# Patient Record
Sex: Male | Born: 1950 | Race: Black or African American | Hispanic: No | Marital: Married | Smoking: Never smoker
Health system: Southern US, Community
[De-identification: ages and names within clinical notes are randomized; demographics above are authoritative.]

## PROBLEM LIST (undated history)

## (undated) DIAGNOSIS — I10 Essential (primary) hypertension: Secondary | ICD-10-CM

## (undated) DIAGNOSIS — N189 Chronic kidney disease, unspecified: Secondary | ICD-10-CM

## (undated) DIAGNOSIS — Z86018 Personal history of other benign neoplasm: Secondary | ICD-10-CM

## (undated) DIAGNOSIS — E039 Hypothyroidism, unspecified: Secondary | ICD-10-CM

## (undated) DIAGNOSIS — G4733 Obstructive sleep apnea (adult) (pediatric): Secondary | ICD-10-CM

## (undated) DIAGNOSIS — Z86011 Personal history of benign neoplasm of the brain: Secondary | ICD-10-CM

## (undated) DIAGNOSIS — E230 Hypopituitarism: Secondary | ICD-10-CM

## (undated) DIAGNOSIS — E119 Type 2 diabetes mellitus without complications: Secondary | ICD-10-CM

## (undated) DIAGNOSIS — Z8639 Personal history of other endocrine, nutritional and metabolic disease: Secondary | ICD-10-CM

## (undated) DIAGNOSIS — G40909 Epilepsy, unspecified, not intractable, without status epilepticus: Principal | ICD-10-CM

## (undated) HISTORY — DX: Personal history of benign neoplasm of the brain: Z86.011

## (undated) HISTORY — DX: Hypopituitarism: E23.0

## (undated) HISTORY — DX: Type 2 diabetes mellitus without complications: E11.9

## (undated) HISTORY — PX: BRAIN SURGERY: SHX531

## (undated) HISTORY — DX: Epilepsy, unspecified, not intractable, without status epilepticus: G40.909

---

## 1998-07-24 ENCOUNTER — Ambulatory Visit (HOSPITAL_COMMUNITY): Admission: RE | Admit: 1998-07-24 | Discharge: 1998-07-24 | Payer: Self-pay | Admitting: Gastroenterology

## 1999-10-29 ENCOUNTER — Encounter: Payer: Self-pay | Admitting: Cardiovascular Disease

## 1999-10-29 ENCOUNTER — Ambulatory Visit: Admission: RE | Admit: 1999-10-29 | Discharge: 1999-10-29 | Payer: Self-pay | Admitting: Cardiovascular Disease

## 2001-07-21 ENCOUNTER — Ambulatory Visit (HOSPITAL_COMMUNITY): Admission: RE | Admit: 2001-07-21 | Discharge: 2001-07-21 | Payer: Self-pay | Admitting: Gastroenterology

## 2013-08-31 ENCOUNTER — Encounter: Payer: Self-pay | Admitting: *Deleted

## 2013-09-03 ENCOUNTER — Encounter: Payer: Self-pay | Admitting: Neurology

## 2013-09-03 ENCOUNTER — Ambulatory Visit (INDEPENDENT_AMBULATORY_CARE_PROVIDER_SITE_OTHER): Payer: Medicare Other | Admitting: Neurology

## 2013-09-03 VITALS — BP 192/119 | HR 67 | Ht 71.0 in | Wt 260.0 lb

## 2013-09-03 DIAGNOSIS — I639 Cerebral infarction, unspecified: Secondary | ICD-10-CM

## 2013-09-03 DIAGNOSIS — I6350 Cerebral infarction due to unspecified occlusion or stenosis of unspecified cerebral artery: Secondary | ICD-10-CM

## 2013-09-03 DIAGNOSIS — I10 Essential (primary) hypertension: Secondary | ICD-10-CM

## 2013-09-03 NOTE — Patient Instructions (Signed)
Overall you are doing fairly well but I do want to suggest a few things today:   Remember to drink plenty of fluid, eat healthy meals and do not skip any meals. Try to eat protein with a every meal and eat a healthy snack such as fruit or nuts in between meals. Try to keep a regular sleep-wake schedule and try to exercise daily, particularly in the form of walking, 20-30 minutes a day, if you can.   As far as your medications are concerned, I would like to suggest you start taking a daily baby aspirin (81mg ).   Continue taking your cholesterol medication daily  Please follow up with your primary care physician today for blood pressure optimization.   Please have some blood work drawn today after checking out  I would like you to have a carotid ultrasound and brain MRI done  We will follow up once the workup is completed. Please call us with any interim questions, concerns, problems, updates or refill requests.   My clinical assistant and will answer any of your questions and relay your messages to me and also relay most of my messages to you.   Our phone number is [phone]. We also have an after hours call service for urgent matters and there is a physician on-call for urgent questions. For any emergencies you know to call 911 or go to the nearest emergency room

## 2013-09-03 NOTE — Progress Notes (Signed)
[facility]    Provider:  Dr Janann Colonel Referring Provider: Keene Breath., MD Primary Care Physician:  Placido Sou, MD  CC:  Visual field   HPI:  Trevor Kelley is a 63 y.o. male here as a referral from Dr. Clydene Laming for visual field defect. Was noted by eye doctor, patient did not notice any deficits. On routine eye exam was noted to have visual deficit in the superior temporal quadrant. Patient does not recall any episodes of transient visual changes. No recent episodes of feeling off balance, bumping into things, car accidents. No episodes of facial weakness, ptosis. No history of focal weakness or sensory changes.   Overall feels he is pretty healthy. Has elevated blood pressure, currently taking 3 medications for the blood pressure. No history of DM. Does not take a daily ASA. Takes a daily statin.   Review of Systems: Out of a complete 14 system review, the patient complains of only the following symptoms, and all other reviewed systems are negative. + loss of vision, eye pain, swelling in legs, weakness, snoring, not enough sleep, decreased energy  History   Social History  . Marital Status: Married    Spouse Name: N/A    Number of Children: N/A  . Years of Education: N/A   Occupational History  . Not on file.   Social History Main Topics  . Smoking status: Never Smoker   . Smokeless tobacco: Never Used  . Alcohol Use: No  . Drug Use: No  . Sexual Activity: Not on file   Other Topics Concern  . Not on file   Social History Narrative   Married, 2 children   Right handed   12 th grade   1 cup daily    Family History  Problem Relation Age of Onset  . Hypothyroidism    . Diabetes Mellitus II    . Cancer    . Hypertension      No past medical history on file.  No past surgical history on file.  Current Outpatient Prescriptions  Medication Sig Dispense Refill  . bimatoprost (LUMIGAN) 0.01 % SOLN 1 drop at bedtime.      . carvedilol  (COREG) 12.5 MG tablet Take 12.5 mg by mouth 2 (two) times daily with a meal.      . furosemide (LASIX) 20 MG tablet Take 20 mg by mouth.      Marland Kitchen amLODipine (NORVASC) 10 MG tablet Take 10 mg by mouth daily.       No current facility-administered medications for this visit.    Allergies as of 09/03/2013  . (No Known Allergies)    Vitals: BP 192/119  Pulse 67  Ht 5\' 11"  (1.803 m)  Wt 174 lb (78.926 kg)  BMI 24.28 kg/m2 Last Weight:  Wt Readings from Last 1 Encounters:  09/03/13 174 lb (78.926 kg)   Last Height:   Ht Readings from Last 1 Encounters:  09/03/13 5\' 11"  (1.803 m)     Physical exam: Exam: Gen: NAD, conversant Eyes: anicteric sclerae, moist conjunctivae HENT: Atraumatic, oropharynx clear Neck: Trachea midline; supple,  Lungs: CTA, no wheezing, rales, rhonic                          CV: RRR, no MRG, no carotid bruits Abdomen: Soft, non-tender;  Extremities: No peripheral edema  Skin: Normal temperature, no rash,  Psych: Appropriate affect, pleasant  Neuro: MS: AA&Ox3, appropriately interactive, normal affect   Speech:  fluent w/o paraphasic error  Memory: good recent and remote recall  CN: PERRL, EOMI no nystagmus,No VF deficits noted, no ptosis, sensation intact to LT V1-V3 bilat, face symmetric, no weakness, hearing grossly intact, palate elevates symmetrically, shoulder shrug 5/5 bilat,  tongue protrudes midline, no fasiculations noted.  Motor: normal bulk and tone Strength: 5/5  In all extremities  Coord: rapid alternating and point-to-point (FNF, HTS) movements intact.  Reflexes: symmetrical, bilat downgoing toes  Sens: LT intact in all extremities  Gait: posture, stance, stride and arm-swing normal. Tandem gait intact. Able to walk on heels and toes. Romberg absent.   Assessment:  After physical and neurologic examination, review of laboratory studies, imaging, neurophysiology testing and pre-existing records, assessment will be reviewed on  the problem list.  Plan:  Treatment plan and additional workup will be reviewed under Problem List.  1)Visual field deficit 2)HTN  63y/o gentleman presenting for initial evaluation of noted OD VF deficit found on routine eye exam, patient denies any subjective visual loss. With age and risk factors would have concern over possible CVA. Will check MRI brain, carotid ultrasound, HbA1c and lipid panel. Will start daily 81mg  ASA, continue on Statin. Patient instructed to follow up with PCP today due to continued elevated BP, will likely need BP medication adjustment. Follow up once workup completed.   Jim Like, DO  [facility] [address] [place], [place] [address]  Phone [phone] Fax [phone]

## 2013-09-04 LAB — LIPID PANEL WITH LDL/HDL RATIO
Cholesterol, Total: 122 mg/dL (ref 100–199)
HDL: 30 mg/dL — ABNORMAL LOW (ref >39)
LDL Calculated: 71 mg/dL (ref 0–99)
LDl/HDL Ratio: 2.4 ratio units (ref 0.0–3.6)
Triglycerides: 103 mg/dL (ref 0–149)
VLDL Cholesterol Cal: 21 mg/dL (ref 5–40)

## 2013-09-04 LAB — HGB A1C W/O EAG: Hgb A1c MFr Bld: 6.6 % — ABNORMAL HIGH (ref 4.8–5.6)

## 2013-09-04 NOTE — Progress Notes (Signed)
Quick Note:  Tried calling patient, unable to leave a voice mail, get a fax sound, will try again later. ______

## 2013-09-04 NOTE — Progress Notes (Signed)
Quick Note:  Informed patient of elevated blood sugar, instructed patient to follow up with his PCP to monitor this. ______

## 2013-09-11 ENCOUNTER — Encounter: Payer: Self-pay | Admitting: Neurology

## 2013-09-11 ENCOUNTER — Ambulatory Visit (INDEPENDENT_AMBULATORY_CARE_PROVIDER_SITE_OTHER): Payer: Medicare Other | Admitting: Neurology

## 2013-09-11 ENCOUNTER — Other Ambulatory Visit: Payer: Self-pay | Admitting: Neurology

## 2013-09-11 ENCOUNTER — Ambulatory Visit
Admission: RE | Admit: 2013-09-11 | Discharge: 2013-09-11 | Disposition: A | Discharge status: Home / Self Care | Payer: Medicare Other | Source: Ambulatory Visit | Attending: Neurology | Admitting: Neurology

## 2013-09-11 VITALS — BP 149/90 | HR 86 | Ht 72.0 in | Wt 252.0 lb

## 2013-09-11 DIAGNOSIS — I639 Cerebral infarction, unspecified: Secondary | ICD-10-CM

## 2013-09-11 DIAGNOSIS — G9389 Other specified disorders of brain: Secondary | ICD-10-CM

## 2013-09-11 DIAGNOSIS — I6350 Cerebral infarction due to unspecified occlusion or stenosis of unspecified cerebral artery: Secondary | ICD-10-CM

## 2013-09-11 DIAGNOSIS — G939 Disorder of brain, unspecified: Secondary | ICD-10-CM

## 2013-09-11 MED ORDER — GADOBENATE DIMEGLUMINE 529 MG/ML IV SOLN
20.0000 mL | Freq: Once | INTRAVENOUS | Status: AC | PRN
  Administered 2013-09-11: 20 mL via INTRAVENOUS

## 2013-09-11 NOTE — Progress Notes (Signed)
[facility]    Provider:  Dr Janann Colonel Referring Provider: Deterding, Joyice Faster, MD Primary Care Physician:  Placido Sou, MD  CC:  Visual field   HPI:  Trevor Kelley is a 63 y.o. male here as a follow up from Dr. Jimmy Footman for visual field defect. Returns today to discuss MRI results with concern over a mass noted. He reports being stable overall since last visit. No acute concerns.   MRI reviewed with findings concerning for a meningioma. Official report pending.    Initial visit 08/2013 Was noted by eye doctor, patient did not notice any deficits. On routine eye exam was noted to have visual deficit in the superior temporal quadrant. Patient does not recall any episodes of transient visual changes. No recent episodes of feeling off balance, bumping into things, car accidents. No episodes of facial weakness, ptosis. No history of focal weakness or sensory changes.   Overall feels he is pretty healthy. Has elevated blood pressure, currently taking 3 medications for the blood pressure. No history of DM. Does not take a daily ASA. Takes a daily statin.   Review of Systems: Out of a complete 14 system review, the patient complains of only the following symptoms, and all other reviewed systems are negative. + loss of vision, eye pain  History   Social History  . Marital Status: Married    Spouse Name: N/A    Number of Children: N/A  . Years of Education: N/A   Occupational History  . Not on file.   Social History Main Topics  . Smoking status: Never Smoker   . Smokeless tobacco: Never Used  . Alcohol Use: No  . Drug Use: No  . Sexual Activity: Not on file   Other Topics Concern  . Not on file   Social History Narrative   Married, 2 children   Right handed   12 th grade   1 cup daily    Family History  Problem Relation Age of Onset  . Hypothyroidism    . Diabetes Mellitus II    . Cancer    . Hypertension      No past medical history on  file.  No past surgical history on file.  Current Outpatient Prescriptions  Medication Sig Dispense Refill  . amLODipine (NORVASC) 10 MG tablet Take 10 mg by mouth daily.      . bimatoprost (LUMIGAN) 0.01 % SOLN 1 drop at bedtime.      . carvedilol (COREG) 12.5 MG tablet Take 12.5 mg by mouth 2 (two) times daily with a meal.      . finasteride (PROSCAR) 5 MG tablet Take 5 mg by mouth daily.      . furosemide (LASIX) 20 MG tablet Take 20 mg by mouth.      Marland Kitchen lisinopril (PRINIVIL,ZESTRIL) 40 MG tablet Take 40 mg by mouth daily.      . simvastatin (ZOCOR) 20 MG tablet Take 20 mg by mouth daily.      . tamsulosin (FLOMAX) 0.4 MG CAPS capsule Take 0.4 mg by mouth.       No current facility-administered medications for this visit.    Allergies as of 09/11/2013  . (No Known Allergies)    Vitals: BP 149/90  Pulse 86  Ht 6' (1.829 m)  Wt 252 lb (114.306 kg)  BMI 34.17 kg/m2 Last Weight:  Wt Readings from Last 1 Encounters:  09/11/13 252 lb (114.306 kg)   Last Height:   Ht Readings from Last  1 Encounters:  09/11/13 6' (1.829 m)     Physical exam: Exam: Gen: NAD, conversant Eyes: anicteric sclerae, moist conjunctivae HENT: Atraumatic, oropharynx clear Neck: Trachea midline; supple,  Lungs: CTA, no wheezing, rales, rhonic                          CV: RRR, no MRG, no carotid bruits Abdomen: Soft, non-tender;  Extremities: No peripheral edema  Skin: Normal temperature, no rash,  Psych: Appropriate affect, pleasant  Neuro: MS: AA&Ox3, appropriately interactive, normal affect   Speech: fluent w/o paraphasic error  Memory: good recent and remote recall  CN: PERRL, EOMI no nystagmus,No VF deficits noted, no ptosis, sensation intact to LT V1-V3 bilat, face symmetric, no weakness, hearing grossly intact, palate elevates symmetrically, shoulder shrug 5/5 bilat,  tongue protrudes midline, no fasiculations noted.  Motor: normal bulk and tone Strength: 5/5  In all  extremities  Coord: rapid alternating and point-to-point (FNF, HTS) movements intact.  Reflexes: symmetrical, bilat downgoing toes  Sens: LT intact in all extremities  Gait: posture, stance, stride and arm-swing normal. Tandem gait intact. Able to walk on heels and toes. Romberg absent.   Assessment:  After physical and neurologic examination, review of laboratory studies, imaging, neurophysiology testing and pre-existing records, assessment will be reviewed on the problem list.  Plan:  Treatment plan and additional workup will be reviewed under Problem List.  1)Visual field deficit 2)Brain mass  63y/o gentleman presenting for follow up evaluation of noted OD VF deficit found on routine eye exam, patient denies any subjective visual loss. MRI recently completed shows a brain mass which appears consistent with a meningioma (official report pending). Will refer to neurosurgery for further workup and treatment. Follow up as needed.   Jim Like, DO  [facility] [address] [place], [place] [address]  Phone [phone] Fax [phone]

## 2013-09-14 ENCOUNTER — Other Ambulatory Visit: Payer: Medicare Other

## 2013-09-18 ENCOUNTER — Telehealth: Payer: Self-pay | Admitting: Neurology

## 2013-09-18 ENCOUNTER — Other Ambulatory Visit: Payer: Self-pay | Admitting: Neurology

## 2013-09-18 DIAGNOSIS — G9389 Other specified disorders of brain: Secondary | ICD-10-CM

## 2013-09-18 NOTE — Telephone Encounter (Signed)
Patient calling requesting an second opinion to another Neuro Surgeon.  Saw Dr Diamantina Monks on 5/5, but discussed with family and would like an second opinion.  Thanks

## 2013-09-18 NOTE — Telephone Encounter (Signed)
Please let him know a referral was placed to [facility]. He will receive a call to schedule an appointment. Thanks.

## 2013-09-19 ENCOUNTER — Telehealth: Payer: Self-pay | Admitting: Neurology

## 2013-09-19 ENCOUNTER — Other Ambulatory Visit (HOSPITAL_COMMUNITY): Payer: Self-pay | Admitting: Neurosurgery

## 2013-09-19 DIAGNOSIS — D329 Benign neoplasm of meninges, unspecified: Secondary | ICD-10-CM

## 2013-09-19 NOTE — Telephone Encounter (Signed)
Pt information was faxed over to [facility] [place] with [place] April. Confirmation received.

## 2013-09-19 NOTE — Telephone Encounter (Signed)
Informed patient's wife , she verbalized understanding

## 2013-09-19 NOTE — Telephone Encounter (Signed)
April from [facility] called wants Dr. Hazle Quant nurse to fax her the Imaging Report and Notes from pt's visit. Fax to [phone]. If you have any questions please call April at [phone]. Thanks

## 2013-09-20 ENCOUNTER — Encounter: Payer: Self-pay | Admitting: *Deleted

## 2013-09-28 ENCOUNTER — Inpatient Hospital Stay: Admit: 2013-09-28 | Payer: Self-pay | Admitting: Neurosurgery

## 2013-09-28 SURGERY — CRANIOTOMY TUMOR EXCISION
Anesthesia: General | Laterality: Bilateral

## 2013-10-02 ENCOUNTER — Telehealth: Payer: Self-pay | Admitting: Neurology

## 2013-10-02 NOTE — Telephone Encounter (Signed)
Pt calling wanting to make Dr. Janann Colonel aware that he is having surgery at [facility] with Dr. Harrison Mons. FYI

## 2013-10-02 NOTE — Telephone Encounter (Signed)
FYI..Patient wanted to inform Dr. Janann Colonel that he has decided to have surgery at [facility] with Dr Harrison Mons.

## 2014-02-05 DIAGNOSIS — E291 Testicular hypofunction: Secondary | ICD-10-CM | POA: Diagnosis present

## 2014-02-05 DIAGNOSIS — E669 Obesity, unspecified: Secondary | ICD-10-CM | POA: Diagnosis present

## 2014-02-05 DIAGNOSIS — E1169 Type 2 diabetes mellitus with other specified complication: Secondary | ICD-10-CM | POA: Diagnosis present

## 2014-02-05 DIAGNOSIS — E119 Type 2 diabetes mellitus without complications: Secondary | ICD-10-CM | POA: Diagnosis present

## 2014-02-05 DIAGNOSIS — E038 Other specified hypothyroidism: Secondary | ICD-10-CM | POA: Diagnosis present

## 2014-02-05 DIAGNOSIS — E2749 Other adrenocortical insufficiency: Secondary | ICD-10-CM | POA: Diagnosis present

## 2014-02-12 DIAGNOSIS — D352 Benign neoplasm of pituitary gland: Secondary | ICD-10-CM | POA: Diagnosis present

## 2014-05-07 DIAGNOSIS — N182 Chronic kidney disease, stage 2 (mild): Secondary | ICD-10-CM | POA: Diagnosis present

## 2014-05-07 DIAGNOSIS — N189 Chronic kidney disease, unspecified: Secondary | ICD-10-CM | POA: Diagnosis present

## 2014-08-21 ENCOUNTER — Encounter: Payer: Self-pay | Admitting: Urology

## 2014-12-03 DIAGNOSIS — G4733 Obstructive sleep apnea (adult) (pediatric): Secondary | ICD-10-CM | POA: Diagnosis present

## 2016-05-25 DIAGNOSIS — Z79899 Other long term (current) drug therapy: Secondary | ICD-10-CM | POA: Diagnosis not present

## 2016-05-25 DIAGNOSIS — E038 Other specified hypothyroidism: Secondary | ICD-10-CM | POA: Diagnosis not present

## 2016-05-25 DIAGNOSIS — R7303 Prediabetes: Secondary | ICD-10-CM | POA: Diagnosis not present

## 2016-05-25 DIAGNOSIS — E230 Hypopituitarism: Secondary | ICD-10-CM | POA: Diagnosis not present

## 2016-05-25 DIAGNOSIS — E1122 Type 2 diabetes mellitus with diabetic chronic kidney disease: Secondary | ICD-10-CM | POA: Diagnosis not present

## 2016-05-25 DIAGNOSIS — Z9989 Dependence on other enabling machines and devices: Secondary | ICD-10-CM | POA: Diagnosis not present

## 2016-05-25 DIAGNOSIS — E785 Hyperlipidemia, unspecified: Secondary | ICD-10-CM | POA: Diagnosis not present

## 2016-05-25 DIAGNOSIS — N183 Chronic kidney disease, stage 3 (moderate): Secondary | ICD-10-CM | POA: Diagnosis not present

## 2016-05-25 DIAGNOSIS — I10 Essential (primary) hypertension: Secondary | ICD-10-CM | POA: Diagnosis not present

## 2016-05-25 DIAGNOSIS — E1169 Type 2 diabetes mellitus with other specified complication: Secondary | ICD-10-CM | POA: Diagnosis not present

## 2016-05-25 DIAGNOSIS — E221 Hyperprolactinemia: Secondary | ICD-10-CM | POA: Diagnosis not present

## 2016-05-25 DIAGNOSIS — N400 Enlarged prostate without lower urinary tract symptoms: Secondary | ICD-10-CM | POA: Diagnosis not present

## 2016-05-25 DIAGNOSIS — E039 Hypothyroidism, unspecified: Secondary | ICD-10-CM | POA: Diagnosis not present

## 2016-05-25 DIAGNOSIS — G4733 Obstructive sleep apnea (adult) (pediatric): Secondary | ICD-10-CM | POA: Diagnosis not present

## 2016-05-25 DIAGNOSIS — E2740 Unspecified adrenocortical insufficiency: Secondary | ICD-10-CM | POA: Diagnosis not present

## 2016-05-25 DIAGNOSIS — E2749 Other adrenocortical insufficiency: Secondary | ICD-10-CM | POA: Diagnosis not present

## 2016-05-25 DIAGNOSIS — D352 Benign neoplasm of pituitary gland: Secondary | ICD-10-CM | POA: Diagnosis not present

## 2016-05-25 DIAGNOSIS — E291 Testicular hypofunction: Secondary | ICD-10-CM | POA: Diagnosis not present

## 2016-05-25 DIAGNOSIS — Z9889 Other specified postprocedural states: Secondary | ICD-10-CM | POA: Diagnosis not present

## 2016-05-25 DIAGNOSIS — I129 Hypertensive chronic kidney disease with stage 1 through stage 4 chronic kidney disease, or unspecified chronic kidney disease: Secondary | ICD-10-CM | POA: Diagnosis not present

## 2016-05-25 DIAGNOSIS — N2581 Secondary hyperparathyroidism of renal origin: Secondary | ICD-10-CM | POA: Diagnosis not present

## 2016-06-28 DIAGNOSIS — H53413 Scotoma involving central area, bilateral: Secondary | ICD-10-CM | POA: Diagnosis not present

## 2016-06-28 DIAGNOSIS — E119 Type 2 diabetes mellitus without complications: Secondary | ICD-10-CM | POA: Diagnosis not present

## 2016-06-28 DIAGNOSIS — H401132 Primary open-angle glaucoma, bilateral, moderate stage: Secondary | ICD-10-CM | POA: Diagnosis not present

## 2016-07-12 DIAGNOSIS — N2581 Secondary hyperparathyroidism of renal origin: Secondary | ICD-10-CM | POA: Diagnosis not present

## 2016-07-12 DIAGNOSIS — E663 Overweight: Secondary | ICD-10-CM | POA: Diagnosis not present

## 2016-07-12 DIAGNOSIS — D631 Anemia in chronic kidney disease: Secondary | ICD-10-CM | POA: Diagnosis not present

## 2016-07-12 DIAGNOSIS — R809 Proteinuria, unspecified: Secondary | ICD-10-CM | POA: Diagnosis not present

## 2016-07-12 DIAGNOSIS — I129 Hypertensive chronic kidney disease with stage 1 through stage 4 chronic kidney disease, or unspecified chronic kidney disease: Secondary | ICD-10-CM | POA: Diagnosis not present

## 2016-07-12 DIAGNOSIS — N529 Male erectile dysfunction, unspecified: Secondary | ICD-10-CM | POA: Diagnosis not present

## 2016-07-12 DIAGNOSIS — D352 Benign neoplasm of pituitary gland: Secondary | ICD-10-CM | POA: Diagnosis not present

## 2016-07-12 DIAGNOSIS — E782 Mixed hyperlipidemia: Secondary | ICD-10-CM | POA: Diagnosis not present

## 2016-07-12 DIAGNOSIS — R319 Hematuria, unspecified: Secondary | ICD-10-CM | POA: Diagnosis not present

## 2016-07-12 DIAGNOSIS — N183 Chronic kidney disease, stage 3 (moderate): Secondary | ICD-10-CM | POA: Diagnosis not present

## 2016-10-26 DIAGNOSIS — H401132 Primary open-angle glaucoma, bilateral, moderate stage: Secondary | ICD-10-CM | POA: Diagnosis not present

## 2016-10-26 DIAGNOSIS — H2513 Age-related nuclear cataract, bilateral: Secondary | ICD-10-CM | POA: Diagnosis not present

## 2016-11-23 DIAGNOSIS — N183 Chronic kidney disease, stage 3 (moderate): Secondary | ICD-10-CM | POA: Diagnosis not present

## 2016-11-23 DIAGNOSIS — E2749 Other adrenocortical insufficiency: Secondary | ICD-10-CM | POA: Diagnosis not present

## 2016-11-23 DIAGNOSIS — I129 Hypertensive chronic kidney disease with stage 1 through stage 4 chronic kidney disease, or unspecified chronic kidney disease: Secondary | ICD-10-CM | POA: Diagnosis not present

## 2016-11-23 DIAGNOSIS — N2581 Secondary hyperparathyroidism of renal origin: Secondary | ICD-10-CM | POA: Diagnosis not present

## 2016-11-23 DIAGNOSIS — E1122 Type 2 diabetes mellitus with diabetic chronic kidney disease: Secondary | ICD-10-CM | POA: Diagnosis not present

## 2016-11-23 DIAGNOSIS — D352 Benign neoplasm of pituitary gland: Secondary | ICD-10-CM | POA: Diagnosis not present

## 2016-11-23 DIAGNOSIS — E230 Hypopituitarism: Secondary | ICD-10-CM | POA: Diagnosis not present

## 2016-11-23 DIAGNOSIS — E039 Hypothyroidism, unspecified: Secondary | ICD-10-CM | POA: Diagnosis not present

## 2016-11-23 DIAGNOSIS — R7303 Prediabetes: Secondary | ICD-10-CM | POA: Diagnosis not present

## 2016-11-23 DIAGNOSIS — Z9889 Other specified postprocedural states: Secondary | ICD-10-CM | POA: Diagnosis not present

## 2016-11-23 DIAGNOSIS — E038 Other specified hypothyroidism: Secondary | ICD-10-CM | POA: Diagnosis not present

## 2016-11-23 DIAGNOSIS — E785 Hyperlipidemia, unspecified: Secondary | ICD-10-CM | POA: Diagnosis not present

## 2016-11-23 DIAGNOSIS — E8809 Other disorders of plasma-protein metabolism, not elsewhere classified: Secondary | ICD-10-CM | POA: Diagnosis not present

## 2016-11-24 DIAGNOSIS — E119 Type 2 diabetes mellitus without complications: Secondary | ICD-10-CM | POA: Diagnosis not present

## 2016-11-24 DIAGNOSIS — H3589 Other specified retinal disorders: Secondary | ICD-10-CM | POA: Diagnosis not present

## 2016-11-24 DIAGNOSIS — H401132 Primary open-angle glaucoma, bilateral, moderate stage: Secondary | ICD-10-CM | POA: Diagnosis not present

## 2016-11-24 DIAGNOSIS — H53439 Sector or arcuate defects, unspecified eye: Secondary | ICD-10-CM | POA: Diagnosis not present

## 2016-12-07 DIAGNOSIS — E291 Testicular hypofunction: Secondary | ICD-10-CM | POA: Diagnosis not present

## 2016-12-07 DIAGNOSIS — N401 Enlarged prostate with lower urinary tract symptoms: Secondary | ICD-10-CM | POA: Diagnosis not present

## 2016-12-13 DIAGNOSIS — R800 Isolated proteinuria: Secondary | ICD-10-CM | POA: Diagnosis not present

## 2016-12-13 DIAGNOSIS — R351 Nocturia: Secondary | ICD-10-CM | POA: Diagnosis not present

## 2016-12-13 DIAGNOSIS — E291 Testicular hypofunction: Secondary | ICD-10-CM | POA: Diagnosis not present

## 2016-12-13 DIAGNOSIS — N5201 Erectile dysfunction due to arterial insufficiency: Secondary | ICD-10-CM | POA: Diagnosis not present

## 2016-12-13 DIAGNOSIS — N401 Enlarged prostate with lower urinary tract symptoms: Secondary | ICD-10-CM | POA: Diagnosis not present

## 2016-12-14 DIAGNOSIS — I129 Hypertensive chronic kidney disease with stage 1 through stage 4 chronic kidney disease, or unspecified chronic kidney disease: Secondary | ICD-10-CM | POA: Diagnosis not present

## 2016-12-14 DIAGNOSIS — N529 Male erectile dysfunction, unspecified: Secondary | ICD-10-CM | POA: Diagnosis not present

## 2016-12-14 DIAGNOSIS — N183 Chronic kidney disease, stage 3 (moderate): Secondary | ICD-10-CM | POA: Diagnosis not present

## 2016-12-14 DIAGNOSIS — D631 Anemia in chronic kidney disease: Secondary | ICD-10-CM | POA: Diagnosis not present

## 2016-12-14 DIAGNOSIS — D649 Anemia, unspecified: Secondary | ICD-10-CM | POA: Diagnosis not present

## 2016-12-14 DIAGNOSIS — D352 Benign neoplasm of pituitary gland: Secondary | ICD-10-CM | POA: Diagnosis not present

## 2016-12-14 DIAGNOSIS — E663 Overweight: Secondary | ICD-10-CM | POA: Diagnosis not present

## 2016-12-14 DIAGNOSIS — R319 Hematuria, unspecified: Secondary | ICD-10-CM | POA: Diagnosis not present

## 2016-12-14 DIAGNOSIS — N2581 Secondary hyperparathyroidism of renal origin: Secondary | ICD-10-CM | POA: Diagnosis not present

## 2016-12-14 DIAGNOSIS — E782 Mixed hyperlipidemia: Secondary | ICD-10-CM | POA: Diagnosis not present

## 2016-12-14 DIAGNOSIS — R809 Proteinuria, unspecified: Secondary | ICD-10-CM | POA: Diagnosis not present

## 2016-12-30 ENCOUNTER — Encounter (HOSPITAL_COMMUNITY): Payer: Self-pay

## 2016-12-30 ENCOUNTER — Ambulatory Visit (HOSPITAL_COMMUNITY)
Admission: EM | Admit: 2016-12-30 | Discharge: 2016-12-30 | Disposition: A | Discharge status: Home / Self Care | Payer: Medicare Other

## 2016-12-30 DIAGNOSIS — E2749 Other adrenocortical insufficiency: Secondary | ICD-10-CM | POA: Diagnosis not present

## 2016-12-30 DIAGNOSIS — E876 Hypokalemia: Secondary | ICD-10-CM | POA: Diagnosis present

## 2016-12-30 DIAGNOSIS — N184 Chronic kidney disease, stage 4 (severe): Secondary | ICD-10-CM | POA: Diagnosis not present

## 2016-12-30 DIAGNOSIS — A419 Sepsis, unspecified organism: Principal | ICD-10-CM | POA: Diagnosis present

## 2016-12-30 DIAGNOSIS — I129 Hypertensive chronic kidney disease with stage 1 through stage 4 chronic kidney disease, or unspecified chronic kidney disease: Secondary | ICD-10-CM | POA: Diagnosis not present

## 2016-12-30 DIAGNOSIS — G4733 Obstructive sleep apnea (adult) (pediatric): Secondary | ICD-10-CM | POA: Diagnosis present

## 2016-12-30 DIAGNOSIS — E1122 Type 2 diabetes mellitus with diabetic chronic kidney disease: Secondary | ICD-10-CM | POA: Diagnosis present

## 2016-12-30 DIAGNOSIS — Z79899 Other long term (current) drug therapy: Secondary | ICD-10-CM

## 2016-12-30 DIAGNOSIS — Z6833 Body mass index (BMI) 33.0-33.9, adult: Secondary | ICD-10-CM

## 2016-12-30 DIAGNOSIS — D696 Thrombocytopenia, unspecified: Secondary | ICD-10-CM | POA: Diagnosis present

## 2016-12-30 DIAGNOSIS — Z23 Encounter for immunization: Secondary | ICD-10-CM

## 2016-12-30 DIAGNOSIS — N179 Acute kidney failure, unspecified: Secondary | ICD-10-CM | POA: Diagnosis present

## 2016-12-30 DIAGNOSIS — M7989 Other specified soft tissue disorders: Secondary | ICD-10-CM | POA: Diagnosis not present

## 2016-12-30 DIAGNOSIS — R197 Diarrhea, unspecified: Secondary | ICD-10-CM | POA: Diagnosis not present

## 2016-12-30 DIAGNOSIS — L03115 Cellulitis of right lower limb: Secondary | ICD-10-CM | POA: Diagnosis present

## 2016-12-30 DIAGNOSIS — N189 Chronic kidney disease, unspecified: Secondary | ICD-10-CM | POA: Diagnosis not present

## 2016-12-30 DIAGNOSIS — I959 Hypotension, unspecified: Secondary | ICD-10-CM | POA: Diagnosis not present

## 2016-12-30 DIAGNOSIS — E291 Testicular hypofunction: Secondary | ICD-10-CM | POA: Diagnosis present

## 2016-12-30 DIAGNOSIS — D631 Anemia in chronic kidney disease: Secondary | ICD-10-CM | POA: Diagnosis present

## 2016-12-30 DIAGNOSIS — E669 Obesity, unspecified: Secondary | ICD-10-CM | POA: Diagnosis present

## 2016-12-30 DIAGNOSIS — D352 Benign neoplasm of pituitary gland: Secondary | ICD-10-CM | POA: Diagnosis present

## 2016-12-30 DIAGNOSIS — E039 Hypothyroidism, unspecified: Secondary | ICD-10-CM | POA: Diagnosis present

## 2016-12-30 DIAGNOSIS — D509 Iron deficiency anemia, unspecified: Secondary | ICD-10-CM | POA: Diagnosis present

## 2016-12-30 LAB — URINALYSIS, ROUTINE W REFLEX MICROSCOPIC
BACTERIA UA: NONE SEEN
Bilirubin Urine: NEGATIVE
Glucose, UA: NEGATIVE mg/dL
KETONES UR: NEGATIVE mg/dL
LEUKOCYTES UA: NEGATIVE
Nitrite: NEGATIVE
PH: 6 (ref 5.0–8.0)
Protein, ur: 100 mg/dL — AB
SQUAMOUS EPITHELIAL / LPF: NONE SEEN
Specific Gravity, Urine: 1.013 (ref 1.005–1.030)

## 2016-12-30 LAB — COMPREHENSIVE METABOLIC PANEL
ALBUMIN: 3.3 g/dL — AB (ref 3.5–5.0)
ALK PHOS: 65 U/L (ref 38–126)
ALT: 47 U/L (ref 17–63)
AST: 53 U/L — AB (ref 15–41)
Anion gap: 10 (ref 5–15)
BUN: 18 mg/dL (ref 6–20)
CALCIUM: 8.3 mg/dL — AB (ref 8.9–10.3)
CO2: 22 mmol/L (ref 22–32)
CREATININE: 2.91 mg/dL — AB (ref 0.61–1.24)
Chloride: 105 mmol/L (ref 101–111)
GFR calc Af Amer: 24 mL/min — ABNORMAL LOW (ref >60)
GFR calc non Af Amer: 21 mL/min — ABNORMAL LOW (ref >60)
GLUCOSE: 103 mg/dL — AB (ref 65–99)
Potassium: 3 mmol/L — ABNORMAL LOW (ref 3.5–5.1)
SODIUM: 137 mmol/L (ref 135–145)
Total Bilirubin: 1.2 mg/dL (ref 0.3–1.2)
Total Protein: 7.1 g/dL (ref 6.5–8.1)

## 2016-12-30 LAB — CBC WITH DIFFERENTIAL/PLATELET
Basophils Absolute: 0 10*3/uL (ref 0.0–0.1)
Basophils Relative: 0 %
EOS ABS: 0.1 10*3/uL (ref 0.0–0.7)
EOS PCT: 1 %
HCT: 38.3 % — ABNORMAL LOW (ref 39.0–52.0)
HEMOGLOBIN: 12.6 g/dL — AB (ref 13.0–17.0)
LYMPHS ABS: 2.8 10*3/uL (ref 0.7–4.0)
Lymphocytes Relative: 19 %
MCH: 24.8 pg — AB (ref 26.0–34.0)
MCHC: 32.9 g/dL (ref 30.0–36.0)
MCV: 75.2 fL — AB (ref 78.0–100.0)
MONOS PCT: 8 %
Monocytes Absolute: 1.2 10*3/uL — ABNORMAL HIGH (ref 0.1–1.0)
Neutro Abs: 11 10*3/uL — ABNORMAL HIGH (ref 1.7–7.7)
Neutrophils Relative %: 73 %
PLATELETS: 126 10*3/uL — AB (ref 150–400)
RBC: 5.09 MIL/uL (ref 4.22–5.81)
RDW: 15.5 % (ref 11.5–15.5)
WBC: 15.2 10*3/uL — ABNORMAL HIGH (ref 4.0–10.5)

## 2016-12-30 LAB — PROTIME-INR
INR: 1.49
Prothrombin Time: 18.2 seconds — ABNORMAL HIGH (ref 11.4–15.2)

## 2016-12-30 LAB — I-STAT CG4 LACTIC ACID, ED: Lactic Acid, Venous: 1.21 mmol/L (ref 0.5–1.9)

## 2016-12-30 MED ORDER — ACETAMINOPHEN 325 MG PO TABS
ORAL_TABLET | ORAL | Status: AC
  Administered 2016-12-30: 650 mg
  Filled 2016-12-30: qty 2

## 2016-12-30 NOTE — ED Notes (Signed)
Legrand Como c, pa evaluated patient and Caryl Pina, rn instructed patient to go toed now, [place], rn escorted patient to ed

## 2016-12-30 NOTE — ED Triage Notes (Signed)
Pt endorses redness to right lower leg since yesterday along with diarrhea. Pt febrile 102.9 in triage.

## 2016-12-30 NOTE — ED Provider Notes (Signed)
Patient here today with right lower leg pain, leg swelling, and fever of 101.  The leg is exquisitely tender 2/3rds up the tibia.  He has not been seen for this yet. I would not be comfortable with prescribing PO abx and simply hoping this will do well.  He needs to be seen in the ED where he can receive a stat white count, IV fluids and possible IV abx, as well as a doppler if his findings don't match up with cellulitis. Philis Fendt, MS, PA-C 9:02 PM, 12/30/2016     Tereasa Coop, PA-C 12/30/16 2102

## 2016-12-31 ENCOUNTER — Encounter (HOSPITAL_COMMUNITY): Payer: Self-pay | Admitting: Physician Assistant

## 2016-12-31 ENCOUNTER — Emergency Department (HOSPITAL_COMMUNITY): Payer: Medicare Other

## 2016-12-31 ENCOUNTER — Inpatient Hospital Stay (HOSPITAL_COMMUNITY)
Admission: EM | Admit: 2016-12-31 | Discharge: 2017-01-04 | DRG: 872 | Disposition: A | Discharge status: Home / Self Care | Payer: Medicare Other | Attending: Internal Medicine | Admitting: Internal Medicine

## 2016-12-31 DIAGNOSIS — Z23 Encounter for immunization: Secondary | ICD-10-CM | POA: Diagnosis not present

## 2016-12-31 DIAGNOSIS — E2749 Other adrenocortical insufficiency: Secondary | ICD-10-CM | POA: Diagnosis not present

## 2016-12-31 DIAGNOSIS — I959 Hypotension, unspecified: Secondary | ICD-10-CM | POA: Diagnosis not present

## 2016-12-31 DIAGNOSIS — N189 Chronic kidney disease, unspecified: Secondary | ICD-10-CM | POA: Diagnosis present

## 2016-12-31 DIAGNOSIS — L0390 Cellulitis, unspecified: Secondary | ICD-10-CM | POA: Diagnosis present

## 2016-12-31 DIAGNOSIS — N179 Acute kidney failure, unspecified: Secondary | ICD-10-CM | POA: Diagnosis present

## 2016-12-31 DIAGNOSIS — D509 Iron deficiency anemia, unspecified: Secondary | ICD-10-CM | POA: Diagnosis present

## 2016-12-31 DIAGNOSIS — Z6833 Body mass index (BMI) 33.0-33.9, adult: Secondary | ICD-10-CM | POA: Diagnosis not present

## 2016-12-31 DIAGNOSIS — D696 Thrombocytopenia, unspecified: Secondary | ICD-10-CM | POA: Diagnosis present

## 2016-12-31 DIAGNOSIS — G4733 Obstructive sleep apnea (adult) (pediatric): Secondary | ICD-10-CM | POA: Diagnosis not present

## 2016-12-31 DIAGNOSIS — L03115 Cellulitis of right lower limb: Secondary | ICD-10-CM | POA: Diagnosis not present

## 2016-12-31 DIAGNOSIS — Z79899 Other long term (current) drug therapy: Secondary | ICD-10-CM | POA: Diagnosis not present

## 2016-12-31 DIAGNOSIS — A419 Sepsis, unspecified organism: Secondary | ICD-10-CM | POA: Diagnosis not present

## 2016-12-31 DIAGNOSIS — E291 Testicular hypofunction: Secondary | ICD-10-CM | POA: Diagnosis not present

## 2016-12-31 DIAGNOSIS — L03116 Cellulitis of left lower limb: Secondary | ICD-10-CM

## 2016-12-31 DIAGNOSIS — D352 Benign neoplasm of pituitary gland: Secondary | ICD-10-CM | POA: Diagnosis not present

## 2016-12-31 DIAGNOSIS — E038 Other specified hypothyroidism: Secondary | ICD-10-CM | POA: Diagnosis not present

## 2016-12-31 DIAGNOSIS — D649 Anemia, unspecified: Secondary | ICD-10-CM | POA: Diagnosis present

## 2016-12-31 DIAGNOSIS — E1169 Type 2 diabetes mellitus with other specified complication: Secondary | ICD-10-CM | POA: Diagnosis not present

## 2016-12-31 DIAGNOSIS — E669 Obesity, unspecified: Secondary | ICD-10-CM | POA: Diagnosis present

## 2016-12-31 DIAGNOSIS — D631 Anemia in chronic kidney disease: Secondary | ICD-10-CM | POA: Diagnosis present

## 2016-12-31 DIAGNOSIS — E119 Type 2 diabetes mellitus without complications: Secondary | ICD-10-CM | POA: Diagnosis present

## 2016-12-31 DIAGNOSIS — I10 Essential (primary) hypertension: Secondary | ICD-10-CM | POA: Diagnosis not present

## 2016-12-31 DIAGNOSIS — N182 Chronic kidney disease, stage 2 (mild): Secondary | ICD-10-CM | POA: Diagnosis present

## 2016-12-31 DIAGNOSIS — R197 Diarrhea, unspecified: Secondary | ICD-10-CM | POA: Diagnosis present

## 2016-12-31 DIAGNOSIS — N184 Chronic kidney disease, stage 4 (severe): Secondary | ICD-10-CM | POA: Diagnosis present

## 2016-12-31 DIAGNOSIS — I129 Hypertensive chronic kidney disease with stage 1 through stage 4 chronic kidney disease, or unspecified chronic kidney disease: Secondary | ICD-10-CM | POA: Diagnosis present

## 2016-12-31 DIAGNOSIS — M7989 Other specified soft tissue disorders: Secondary | ICD-10-CM | POA: Diagnosis not present

## 2016-12-31 DIAGNOSIS — E876 Hypokalemia: Secondary | ICD-10-CM | POA: Diagnosis present

## 2016-12-31 DIAGNOSIS — E039 Hypothyroidism, unspecified: Secondary | ICD-10-CM | POA: Diagnosis present

## 2016-12-31 DIAGNOSIS — E1122 Type 2 diabetes mellitus with diabetic chronic kidney disease: Secondary | ICD-10-CM | POA: Diagnosis present

## 2016-12-31 HISTORY — DX: Hypothyroidism, unspecified: E03.9

## 2016-12-31 HISTORY — DX: Type 2 diabetes mellitus without complications: E11.9

## 2016-12-31 HISTORY — DX: Obstructive sleep apnea (adult) (pediatric): G47.33

## 2016-12-31 HISTORY — DX: Chronic kidney disease, unspecified: N18.9

## 2016-12-31 HISTORY — DX: Personal history of other benign neoplasm: Z86.018

## 2016-12-31 HISTORY — DX: Essential (primary) hypertension: I10

## 2016-12-31 HISTORY — DX: Personal history of other endocrine, nutritional and metabolic disease: Z86.39

## 2016-12-31 LAB — HEMOGLOBIN A1C
Hgb A1c MFr Bld: 5.9 % — ABNORMAL HIGH (ref 4.8–5.6)
Mean Plasma Glucose: 122.63 mg/dL

## 2016-12-31 LAB — LACTIC ACID, PLASMA
LACTIC ACID, VENOUS: 1 mmol/L (ref 0.5–1.9)
Lactic Acid, Venous: 0.8 mmol/L (ref 0.5–1.9)

## 2016-12-31 LAB — IRON AND TIBC
Iron: 11 ug/dL — ABNORMAL LOW (ref 45–182)
SATURATION RATIOS: 6 % — AB (ref 17.9–39.5)
TIBC: 176 ug/dL — AB (ref 250–450)
UIBC: 165 ug/dL

## 2016-12-31 LAB — CBG MONITORING, ED: GLUCOSE-CAPILLARY: 83 mg/dL (ref 65–99)

## 2016-12-31 LAB — MAGNESIUM: Magnesium: 1.7 mg/dL (ref 1.7–2.4)

## 2016-12-31 LAB — I-STAT CG4 LACTIC ACID, ED: Lactic Acid, Venous: 1.22 mmol/L (ref 0.5–1.9)

## 2016-12-31 LAB — TROPONIN I: Troponin I: <0.03 ng/mL (ref <0.03)

## 2016-12-31 LAB — GLUCOSE, CAPILLARY
Glucose-Capillary: 120 mg/dL — ABNORMAL HIGH (ref 65–99)
Glucose-Capillary: 97 mg/dL (ref 65–99)
Glucose-Capillary: 86 mg/dL (ref 65–99)

## 2016-12-31 LAB — RETICULOCYTES
RBC.: 4.69 MIL/uL (ref 4.22–5.81)
Retic Count, Absolute: 51.6 10*3/uL (ref 19.0–186.0)
Retic Ct Pct: 1.1 % (ref 0.4–3.1)

## 2016-12-31 LAB — PROTIME-INR
INR: 1.52
PROTHROMBIN TIME: 18.4 s — AB (ref 11.4–15.2)

## 2016-12-31 LAB — PROCALCITONIN: Procalcitonin: 11.36 ng/mL

## 2016-12-31 LAB — FERRITIN: Ferritin: 295 ng/mL (ref 24–336)

## 2016-12-31 LAB — VITAMIN B12: VITAMIN B 12: 302 pg/mL (ref 180–914)

## 2016-12-31 LAB — FOLATE: FOLATE: 16.4 ng/mL (ref >5.9)

## 2016-12-31 MED ORDER — ACETAMINOPHEN 325 MG PO TABS: 650.0000 mg | ORAL_TABLET | Freq: Four times a day (QID) | ORAL | Status: DC | PRN

## 2016-12-31 MED ORDER — CLINDAMYCIN PHOSPHATE 600 MG/50ML IV SOLN
600.0000 mg | Freq: Three times a day (TID) | INTRAVENOUS | Status: DC
  Administered 2016-12-31 – 2017-01-04 (×13): 600 mg via INTRAVENOUS
  Filled 2016-12-31 (×12): qty 50

## 2016-12-31 MED ORDER — SIMVASTATIN 20 MG PO TABS
20.0000 mg | ORAL_TABLET | Freq: Every day | ORAL | Status: DC
  Administered 2016-12-31 – 2017-01-04 (×5): 20 mg via ORAL
  Filled 2016-12-31 (×5): qty 1

## 2016-12-31 MED ORDER — CALCITRIOL 0.25 MCG PO CAPS
0.2500 ug | ORAL_CAPSULE | Freq: Every day | ORAL | Status: DC
  Administered 2016-12-31 – 2017-01-04 (×5): 0.25 ug via ORAL
  Filled 2016-12-31 (×5): qty 1

## 2016-12-31 MED ORDER — POTASSIUM CHLORIDE CRYS ER 20 MEQ PO TBCR
40.0000 meq | EXTENDED_RELEASE_TABLET | Freq: Three times a day (TID) | ORAL | Status: DC
  Administered 2016-12-31 – 2017-01-04 (×12): 40 meq via ORAL
  Filled 2016-12-31 (×12): qty 2

## 2016-12-31 MED ORDER — ACETAMINOPHEN 650 MG RE SUPP: 650.0000 mg | Freq: Four times a day (QID) | RECTAL | Status: DC | PRN

## 2016-12-31 MED ORDER — HYDROCODONE-ACETAMINOPHEN 10-325 MG PO TABS
1.0000 | ORAL_TABLET | Freq: Every evening | ORAL | Status: DC
  Administered 2017-01-01 – 2017-01-02 (×2): 1 via ORAL
  Filled 2016-12-31 (×3): qty 1

## 2016-12-31 MED ORDER — TESTOSTERONE 20.25 MG/1.25GM (1.62%) TD GEL: 1.0000 | Freq: Every day | TRANSDERMAL | Status: DC

## 2016-12-31 MED ORDER — TIMOLOL MALEATE 0.5 % OP SOLN
1.0000 [drp] | Freq: Two times a day (BID) | OPHTHALMIC | Status: DC
  Administered 2016-12-31 – 2017-01-03 (×5): 1 [drp] via OPHTHALMIC
  Filled 2016-12-31 (×2): qty 5

## 2016-12-31 MED ORDER — HEPARIN SODIUM (PORCINE) 5000 UNIT/ML IJ SOLN
5000.0000 [IU] | Freq: Three times a day (TID) | INTRAMUSCULAR | Status: DC
  Administered 2016-12-31 – 2017-01-04 (×12): 5000 [IU] via SUBCUTANEOUS
  Filled 2016-12-31 (×11): qty 1

## 2016-12-31 MED ORDER — TESTOSTERONE 50 MG/5GM (1%) TD GEL
5.0000 g | Freq: Every day | TRANSDERMAL | Status: DC
  Administered 2016-12-31 – 2017-01-04 (×5): 5 g via TRANSDERMAL
  Filled 2016-12-31 (×5): qty 5

## 2016-12-31 MED ORDER — INSULIN ASPART 100 UNIT/ML ~~LOC~~ SOLN
0.0000 [IU] | Freq: Three times a day (TID) | SUBCUTANEOUS | Status: DC
  Administered 2017-01-02 – 2017-01-03 (×2): 1 [IU] via SUBCUTANEOUS

## 2016-12-31 MED ORDER — SODIUM CHLORIDE 0.9 % IV SOLN
INTRAVENOUS | Status: DC
  Administered 2016-12-31 – 2017-01-04 (×8): via INTRAVENOUS

## 2016-12-31 MED ORDER — PNEUMOCOCCAL VAC POLYVALENT 25 MCG/0.5ML IJ INJ
0.5000 mL | INJECTION | INTRAMUSCULAR | Status: AC
  Administered 2017-01-01: 0.5 mL via INTRAMUSCULAR
  Filled 2016-12-31: qty 0.5

## 2016-12-31 MED ORDER — SODIUM CHLORIDE 0.9 % IV BOLUS (SEPSIS)
500.0000 mL | Freq: Once | INTRAVENOUS | Status: AC
  Administered 2016-12-31: 500 mL via INTRAVENOUS

## 2016-12-31 MED ORDER — CLINDAMYCIN PHOSPHATE 600 MG/50ML IV SOLN
600.0000 mg | Freq: Once | INTRAVENOUS | Status: DC
  Filled 2016-12-31: qty 50

## 2016-12-31 MED ORDER — CARVEDILOL 12.5 MG PO TABS
12.5000 mg | ORAL_TABLET | Freq: Two times a day (BID) | ORAL | Status: DC
Start: 2016-12-31 — End: 2017-01-04
  Administered 2016-12-31 – 2017-01-04 (×9): 12.5 mg via ORAL
  Filled 2016-12-31 (×9): qty 1

## 2016-12-31 MED ORDER — POTASSIUM CHLORIDE CRYS ER 20 MEQ PO TBCR
40.0000 meq | EXTENDED_RELEASE_TABLET | Freq: Once | ORAL | Status: AC
  Administered 2016-12-31: 40 meq via ORAL
  Filled 2016-12-31: qty 2

## 2016-12-31 MED ORDER — LISINOPRIL 40 MG PO TABS
40.0000 mg | ORAL_TABLET | Freq: Two times a day (BID) | ORAL | Status: DC
  Administered 2017-01-01: 40 mg via ORAL
  Filled 2016-12-31 (×3): qty 1

## 2016-12-31 MED ORDER — LEVOTHYROXINE SODIUM 88 MCG PO TABS
88.0000 ug | ORAL_TABLET | Freq: Every day | ORAL | Status: DC
  Administered 2016-12-31 – 2017-01-04 (×5): 88 ug via ORAL
  Filled 2016-12-31 (×5): qty 1

## 2016-12-31 MED ORDER — LATANOPROST 0.005 % OP SOLN
1.0000 [drp] | Freq: Every day | OPHTHALMIC | Status: DC
  Administered 2017-01-01 – 2017-01-03 (×3): 1 [drp] via OPHTHALMIC
  Filled 2016-12-31 (×2): qty 2.5

## 2016-12-31 MED ORDER — HYDROCODONE-ACETAMINOPHEN 10-325 MG PO TABS
1.5000 | ORAL_TABLET | Freq: Every morning | ORAL | Status: DC
  Administered 2016-12-31 – 2017-01-03 (×4): 1.5 via ORAL
  Filled 2016-12-31 (×5): qty 2

## 2016-12-31 MED ORDER — TAMSULOSIN HCL 0.4 MG PO CAPS
0.4000 mg | ORAL_CAPSULE | Freq: Every day | ORAL | Status: DC
  Administered 2016-12-31 – 2017-01-04 (×5): 0.4 mg via ORAL
  Filled 2016-12-31 (×5): qty 1

## 2016-12-31 MED ORDER — AMLODIPINE BESYLATE 10 MG PO TABS
10.0000 mg | ORAL_TABLET | Freq: Two times a day (BID) | ORAL | Status: DC
  Administered 2016-12-31 – 2017-01-01 (×3): 10 mg via ORAL
  Filled 2016-12-31: qty 1
  Filled 2016-12-31: qty 2
  Filled 2016-12-31 (×3): qty 1

## 2016-12-31 MED ORDER — FUROSEMIDE 80 MG PO TABS
80.0000 mg | ORAL_TABLET | Freq: Two times a day (BID) | ORAL | Status: DC
  Administered 2017-01-01 – 2017-01-02 (×3): 80 mg via ORAL
  Filled 2016-12-31: qty 1
  Filled 2016-12-31: qty 4
  Filled 2016-12-31 (×2): qty 1

## 2016-12-31 NOTE — H&P (Signed)
History and Physical    Trevor Kelley ATF:[id number] DOB: 06/18/1950 DOA: 12/31/2016   PCP: Mauricia Area, MD   Patient coming from:  Home    Chief Complaint: fever, diarrhea, right lower leg swelling and erythema   HPI: Trevor Kelley is a 66 y.o. male  with a history of CK D, DM, history of pituitary  adenoma, hypertension, hyperlipidemia, hypothyroidism, OSA, presenting with right lower extremity redness and swelling over the last 2 days,first noted after mowing the lawn on Wednesday. He denies any open wounds at the time. He denies hitting any objects in the right leg. He also reports subjective fevers and malaise, decreased appetite, with decreased liquid intake. He went to  urgent care, and transferred to the ED for further workup. Of note, the patient noted R lower extremity swelling, and has been on a long distance trip one week prior.He denies any chills or night sweats. He denies any headaches. He denies any chest pain or palpitations. He denies any shortness of breath or pleuritic chest pain. He denies any abdominal pain nausea or vomiting. He did have liquid stools prior to admission, last here at the hospital, but he denies any blood in the stools.  No confusion was noted.  He denies any history of DVT. He did not take any medications over the counter prior to this admission.   ED Course:  BP 108/79   Pulse 91   Temp (!) 100.7 F (38.2 C) (Oral)   Resp 20   Ht 6' (1.829 m)   Wt 108.9 kg (240 lb)   SpO2 97%   BMI 32.55 kg/m    abnormalities are remarkable for potassium 3.0, requiring replenishment with 40 mEq of Kdur  bicarb 22 glucose 103 creatinine 2.91, was 1.6 in 2016 was 2.01 in July 2017, in October 2018 was 1.77 lactic acid 1.21 white count 15.2 hemoglobin 12.6 platelets 126 chest x-ray pending right lower extremity ultrasound pending    Review of Systems:  As per HPI otherwise all other systems reviewed and are negative  Past Medical History:    Diagnosis Date  . CKD (chronic kidney disease)   . Diabetes mellitus without complication ([place])   . History of pituitary adenoma   . Hypertension   . Hypothyroidism   . OSA (obstructive sleep apnea)     Past Surgical History:  Procedure Laterality Date  . BRAIN SURGERY      Social History Social History   Social History  . Marital status: Married    Spouse name: N/A  . Number of children: N/A  . Years of education: N/A   Occupational History  . retired    Social History Main Topics  . Smoking status: Never Smoker  . Smokeless tobacco: Never Used  . Alcohol use No  . Drug use: No  . Sexual activity: No   Other Topics Concern  . Not on file   Social History Narrative   Married, 2 children   Right handed   12 th grade   1 cup daily     No Known Allergies  Family History  Problem Relation Age of Onset  . Hypothyroidism Unknown   . Diabetes Mellitus II Unknown   . Cancer Unknown   . Hypertension Unknown       Prior to Admission medications   Medication Sig Start Date End Date Taking? Authorizing Provider  amLODipine (NORVASC) 10 MG tablet Take 10 mg by mouth 2 (two) times daily.    Yes  [provider]  bimatoprost (LUMIGAN) 0.01 % SOLN Place 1 drop into both eyes at bedtime.   Yes [provider]  calcitRIOL (ROCALTROL) 0.25 MCG capsule Take 0.25 mcg by mouth daily.   Yes [provider]  carvedilol (COREG) 12.5 MG tablet Take 12.5 mg by mouth 2 (two) times daily with a meal.   Yes [provider]  furosemide (LASIX) 80 MG tablet Take 80 mg by mouth 2 (two) times daily.   Yes [provider]  HYDROcodone-acetaminophen (NORCO) 10-325 MG tablet Take 1-1.5 tablets by mouth See admin instructions. Take 1 and 1/2 tablets every morning and take 1 tablet every evening   Yes [provider]  levothyroxine (SYNTHROID, LEVOTHROID) 88 MCG tablet Take 88 mcg by mouth daily before breakfast.   Yes [provider]  lisinopril (PRINIVIL,ZESTRIL) 40 MG tablet Take 40 mg by mouth 2 (two) times daily.    Yes [provider]  potassium chloride SA (K-DUR,KLOR-CON) 20 MEQ tablet Take 40 mEq by mouth 3 (three) times daily.   Yes [provider]  simvastatin (ZOCOR) 20 MG tablet Take 20 mg by mouth daily.   Yes [provider]  tamsulosin (FLOMAX) 0.4 MG CAPS capsule Take 0.4 mg by mouth daily.    Yes [provider]  Testosterone 20.25 MG/1.25GM (1.62%) GEL Apply 1 packet topically daily. 11/23/16  Yes [provider]  timolol (TIMOPTIC) 0.5 % ophthalmic solution Place 1 drop into both eyes 2 (two) times daily. 10/26/16  Yes [provider]    Physical Exam:  Vitals:   12/30/16 2128 12/30/16 2131 12/30/16 2326  BP: 108/79    Pulse: 91    Resp: 20    Temp: (!) 102.9 F (39.4 C)  (!) 100.7 F (38.2 C)  TempSrc: Oral  Oral  SpO2: 97%    Weight:  108.9 kg (240 lb)   Height:  6' (1.829 m)    Constitutional: NAD,but ill appearing  Eyes: PERRL, lids and conjunctivae normal, dry  ENMT: Mucous membranes are dry, without exudate or lesions  Neck: normal, supple, no masses, no thyromegaly Respiratory: clear to auscultation bilaterally, no wheezing, no crackles. Normal respiratory effort  Cardiovascular: Regular rate and rhythm, 2/6  murmur, rubs or gallops. 2 + Lower  extremity edema. 2+ pedal pulses. No carotid bruits.  Abdomen: Soft, non tender, No hepatosplenomegaly. Bowel sounds positive.  Musculoskeletal: no clubbing / cyanosis. Moves all extremities  R Leg erythematous extending from R foot to the R knee, no palpable abscess Skin: no jaundice , RLE erythema as above  Neurologic: Sensation intact  Strength equal in all extremities Psychiatric:   Alert and oriented x 3. Somewhat lethargic due to symptoms  mood.     Labs on Admission: I have personally reviewed following labs and imaging studies  CBC:  Recent Labs Lab 12/30/16 2134   WBC 15.2*  NEUTROABS 11.0*  HGB 12.6*  HCT 38.3*  MCV 75.2*  PLT 126*    Basic Metabolic Panel:  Recent Labs Lab 12/30/16 2134  NA 137  K 3.0*  CL 105  CO2 22  GLUCOSE 103*  BUN 18  CREATININE 2.91*  CALCIUM 8.3*    GFR: Estimated Creatinine Clearance: 31 mL/min (A) (by C-G formula based on SCr of 2.91 mg/dL (H)).  Liver Function Tests:  Recent Labs Lab 12/30/16 2134  AST 53*  ALT 47  ALKPHOS 65  BILITOT 1.2  PROT 7.1  ALBUMIN 3.3*   No results for input(s):  LIPASE, AMYLASE in the last 168 hours. No results for input(s): AMMONIA in the last 168 hours.  Coagulation Profile:  Recent Labs Lab 12/30/16 2134  INR 1.49    Cardiac Enzymes: No results for input(s): CKTOTAL, CKMB, CKMBINDEX, TROPONINI in the last 168 hours.  BNP (last 3 results) No results for input(s): PROBNP in the last 8760 hours.  HbA1C: No results for input(s): HGBA1C in the last 72 hours.  CBG: No results for input(s): GLUCAP in the last 168 hours.  Lipid Profile: No results for input(s): CHOL, HDL, LDLCALC, TRIG, CHOLHDL, LDLDIRECT in the last 72 hours.  Thyroid Function Tests: No results for input(s): TSH, T4TOTAL, FREET4, T3FREE, THYROIDAB in the last 72 hours.  Anemia Panel: No results for input(s): VITAMINB12, FOLATE, FERRITIN, TIBC, IRON, RETICCTPCT in the last 72 hours.  Urine analysis:    Component Value Date/Time   COLORURINE YELLOW 12/30/2016 2200   APPEARANCEUR CLEAR 12/30/2016 2200   LABSPEC 1.013 12/30/2016 2200   PHURINE 6.0 12/30/2016 2200   GLUCOSEU NEGATIVE 12/30/2016 2200   HGBUR SMALL (A) 12/30/2016 2200   BILIRUBINUR NEGATIVE 12/30/2016 2200   KETONESUR NEGATIVE 12/30/2016 2200   PROTEINUR 100 (A) 12/30/2016 2200   NITRITE NEGATIVE 12/30/2016 2200   LEUKOCYTESUR NEGATIVE 12/30/2016 2200    Sepsis Labs: @LABRCNTIP (procalcitonin:4,lacticidven:4) )No results found for this or any previous visit (from the past 240 hour(s)).   Radiological Exams  on Admission: No results found.  EKG: Independently reviewed.  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[facility])   Secondary hypothyroidism   Secondary male hypogonadism   Diarrhea   Anemia    Sepsis likely due to Cellulitis of the RLE   Does have leukocytosis WBC 15.2,  Fever up to 100.7,  Initial lactic acid  1.21   VSS Received Clindamycin at the ED, IVF 1/2 L bolus and currently at 125 cc/h SDU  Sepsis order set  X ray Continue antibiotics with Clindamycin  Blood cultures; check urine culture Check  serum lactate and Procalcitonin IVF at 125 cc to complete 2 L, in view or Renal issues  CBC in am  Also RLE [place] has been ordered by EDP , results pending   Type II Diabetes Current blood sugar level is 103  Lab Results  Component Value Date   HGBA1C 6.6 (H) 09/03/2013  Hgb A1C SSI  Diarrhea. Appears to be resolving, one episode at the ED. WBC 15 . Fever up to 100.7  Denies abdominal pain or cramping. Enteric precautions  C diff GI panel  Immodium if C. Diff is negative  Check CBC and CMET in am   Hypokalemia, may be due to diarrhea over the last 2 days in the setting of infection, and diuretics   Current K  3 KG pending  Received oral K 40 meq IVx1  Continue prn replenishment Hold Lasix today  Check Mg Repeat CMET in am    Acute on Chronic kidney disease stage   IV  Current Cr 2.91, no other date available to assess baseline, main Nephrologist Dr. Esau Grew . was 1.6 in 2016 was 2.01 in July 2017, in October 2018 was 1.77 Lab Results  Component Value Date   CREATININE 2.91 (H) 12/30/2016  IVF Hold Lasix and ACE I today  Repeat CMET in am  Hypertension BP 108/79   Pulse 91   Continue home anti-hypertensive medications      Hyperlipidemia Continue home statins   Hypothyroidism: Continue home Synthroid   Thrombocytopenia Likely due to  dilution, infection, no history of ETOH, current 126k   No transfusion is indicated at this time Monitor counts closely Transfuse 1 unit of platelets if count is less or equal than 10,000 or 20,000 if the patient is acutely bleeding Hold Heparin if  platelets drop to less than 50,000  Anemia of chronic disease in the setting of infection  Hemoglobin on admission . 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

## 2016-12-31 NOTE — ED Provider Notes (Signed)
[facility] DEPT Provider Note   CSN: [id number] Arrival date & time: 12/30/16  2057     History   Chief Complaint Chief Complaint  Patient presents with  . Fever  . Cellulitis  . Diarrhea    HPI Trevor Kelley is a 66 y.o. male.  Patient with history of chronic kidney disease (baseline creatinine 1.5 - 2.0), diabetes, hypertension -- presents with complaint of right lower extremity redness and swelling for the past 2 days. In addition, patient has had episodes of watery, nonbloody stool every several hours. Patient denies any injury or trauma. He does state he was working outside 2 days prior to the start of symptoms. Patient developed a fever, malaise. He has had decreased appetite and poor oral intake. No treatment prior to arrival. Patient presented to urgent care but was then transferred to the emergency department for further workup. No history of DVT. The onset of this condition was acute. The course is constant. Aggravating factors: none. Alleviating factors: none.        Past Medical History:  Diagnosis Date  . Diabetes mellitus without complication ([place])   . Hypertension     There are no active problems to display for this patient.   Past Surgical History:  Procedure Laterality Date  . BRAIN SURGERY         Home Medications    Prior to Admission medications   Medication Sig Start Date End Date Taking? Authorizing Provider  amLODipine (NORVASC) 10 MG tablet Take 10 mg by mouth 2 (two) times daily.    Yes [provider]  calcitRIOL (ROCALTROL) 0.25 MCG capsule Take 0.25 mcg by mouth daily.   Yes [provider]  carvedilol (COREG) 12.5 MG tablet Take 12.5 mg by mouth 2 (two) times daily with a meal.   Yes [provider]  furosemide (LASIX) 80 MG tablet Take 80 mg by mouth 2 (two) times daily.   Yes [provider]  HYDROcodone-acetaminophen (NORCO) 10-325 MG tablet Take 1-1.5 tablets by mouth See admin  instructions. Take 1 and 1/2 tablets every morning and take 1 tablet every evening   Yes [provider]  levothyroxine (SYNTHROID, LEVOTHROID) 88 MCG tablet Take 88 mcg by mouth daily before breakfast.   Yes [provider]  lisinopril (PRINIVIL,ZESTRIL) 40 MG tablet Take 40 mg by mouth 2 (two) times daily.    Yes [provider]  potassium chloride SA (K-DUR,KLOR-CON) 20 MEQ tablet Take 40 mEq by mouth 3 (three) times daily.   Yes [provider]  simvastatin (ZOCOR) 20 MG tablet Take 20 mg by mouth daily.   Yes [provider]  tamsulosin (FLOMAX) 0.4 MG CAPS capsule Take 0.4 mg by mouth daily.    Yes [provider]  Testosterone 20.25 MG/1.25GM (1.62%) GEL Apply 1 packet topically daily. 11/23/16  Yes [provider]  bimatoprost (LUMIGAN) 0.01 % SOLN Place 1 drop into both eyes at bedtime.    [provider]  timolol (TIMOPTIC) 0.5 % ophthalmic solution Place 1 drop into both eyes 2 (two) times daily. 10/26/16   [provider]    Family History Family History  Problem Relation Age of Onset  . Hypothyroidism Unknown   . Diabetes Mellitus II Unknown   . Cancer Unknown   . Hypertension Unknown     Social History Social History  Substance Use Topics  . Smoking status: Never Smoker  . Smokeless tobacco: Never Used  . Alcohol use No  Allergies   Patient has no known allergies.   Review of Systems Review of Systems  Constitutional: Positive for appetite change and fever.  HENT: Negative for rhinorrhea and sore throat.   Eyes: Negative for redness.  Respiratory: Negative for cough.   Cardiovascular: Positive for leg swelling. Negative for chest pain.  Gastrointestinal: Negative for abdominal pain, diarrhea, nausea and vomiting.  Genitourinary: Negative for dysuria.  Musculoskeletal: Negative for myalgias.  Skin: Positive for color change. Negative for rash.  Neurological: Negative for  headaches.     Physical Exam Updated Vital Signs BP 108/79   Pulse 91   Temp (!) 100.7 F (38.2 C) (Oral)   Resp 20   Ht 6' (1.829 m)   Wt 108.9 kg (240 lb)   SpO2 97%   BMI 32.55 kg/m   Physical Exam  Constitutional: He appears well-developed and well-nourished.  HENT:  Head: Normocephalic and atraumatic.  Dry mucous membranes.  Eyes: Conjunctivae are normal. Right eye exhibits no discharge. Left eye exhibits no discharge.  Neck: Normal range of motion. Neck supple.  Cardiovascular: Normal rate, regular rhythm and normal heart sounds.   No murmur heard. Pulmonary/Chest: Effort normal and breath sounds normal. No respiratory distress.  Abdominal: Soft. There is no tenderness. There is no rebound and no guarding.  Neurological: He is alert.  Skin: Skin is warm and dry. There is erythema.  Patient with swelling, warmth, and erythema of the right foot extending to the right knee. No palpable abscess. 2+ pitting edema. Symptoms most consistent with cellulitis.  Psychiatric: He has a normal mood and affect.  Nursing note and vitals reviewed.    ED Treatments / Results  Labs (all labs ordered are listed, but only abnormal results are displayed) Labs Reviewed  COMPREHENSIVE METABOLIC PANEL - Abnormal; Notable for the following:       Result Value   Potassium 3.0 (*)    Glucose, Bld 103 (*)    Creatinine, Ser 2.91 (*)    Calcium 8.3 (*)    Albumin 3.3 (*)    AST 53 (*)    GFR calc non Af Amer 21 (*)    GFR calc Af Amer 24 (*)    All other components within normal limits  CBC WITH DIFFERENTIAL/PLATELET - Abnormal; Notable for the following:    WBC 15.2 (*)    Hemoglobin 12.6 (*)    HCT 38.3 (*)    MCV 75.2 (*)    MCH 24.8 (*)    Platelets 126 (*)    Neutro Abs 11.0 (*)    Monocytes Absolute 1.2 (*)    All other components within normal limits  PROTIME-INR - Abnormal; Notable for the following:    Prothrombin Time 18.2 (*)    All other components within normal  limits  URINALYSIS, ROUTINE W REFLEX MICROSCOPIC - Abnormal; Notable for the following:    Hgb urine dipstick SMALL (*)    Protein, ur 100 (*)    All other components within normal limits  CULTURE, BLOOD (ROUTINE X 2)  CULTURE, BLOOD (ROUTINE X 2)  I-STAT CG4 LACTIC ACID, ED  I-STAT CG4 LACTIC ACID, ED    EKG  EKG Interpretation None       Radiology No results found.  Procedures Procedures (including critical care time)  Medications Ordered in ED Medications  clindamycin (CLEOCIN) IVPB 600 mg (not administered)  sodium chloride 0.9 % bolus 500 mL (not administered)  0.9 %  sodium chloride infusion (not administered)  potassium chloride  SA (K-DUR,KLOR-CON) CR tablet 40 mEq (not administered)  acetaminophen (TYLENOL) 325 MG tablet (650 mg  Given 12/30/16 2326)     Initial Impression / Assessment and Plan / ED Course  I have reviewed the triage vital signs and the nursing notes.  Pertinent labs & imaging results that were available during my care of the patient were reviewed by me and considered in my medical decision making (see chart for details).     Patient seen and examined. Work-up initiated. Medications ordered.   Vital signs reviewed and are as follows: BP 108/79   Pulse 91   Temp (!) 100.7 F (38.2 C) (Oral)   Resp 20   Ht 6' (1.829 m)   Wt 108.9 kg (240 lb)   SpO2 97%   BMI 32.55 kg/m   6:41 AM Pt d/w Dr. Thomasene Lot.   Discussed with Dr. Alcario Drought. [facility] team to admit.   Fluids ordered, antibiotics ordered, oral potassium ordered.  Lower extremity has Doppler ordered to rule out DVT. Symptoms most consistent with infectious etiology.  Final Clinical Impressions(s) / ED Diagnoses   Final diagnoses:  Cellulitis of left lower extremity  Hypokalemia  Acute renal failure superimposed on chronic kidney disease, unspecified CKD stage, unspecified acute renal failure type (HCC)  Diarrhea, unspecified type   Patient with history of diabetes and acute and  chronic kidney disease presents with right lower extremity cellulitis, fever, leukocytosis. Admit for IV antibiotics and management.  New Prescriptions New Prescriptions   No medications on file     Carlisle Cater, Hershal Coria 12/31/16 6213    Macarthur Critchley, MD 01/02/17 [phone]

## 2016-12-31 NOTE — Progress Notes (Signed)
Arrived at pt room at 7:55am, per RN, pt is in xray. Will try back later.   Vascular lab Landry Mellow, RDMS, RVT

## 2016-12-31 NOTE — ED Notes (Signed)
Pt's CBG 83.  Informed Danelle Berry, RN.

## 2016-12-31 NOTE — Progress Notes (Signed)
*  PRELIMINARY RESULTS* Vascular Ultrasound Right lower extremity venous duplex has been completed.  Preliminary findings: No evidence of DVT or baker's cyst.   Landry Mellow, RDMS, RVT  12/31/2016, 9:35 AM

## 2017-01-01 ENCOUNTER — Ambulatory Visit (HOSPITAL_COMMUNITY): Payer: Medicare Other

## 2017-01-01 ENCOUNTER — Inpatient Hospital Stay (HOSPITAL_COMMUNITY): Payer: Medicare Other

## 2017-01-01 DIAGNOSIS — E876 Hypokalemia: Secondary | ICD-10-CM | POA: Diagnosis present

## 2017-01-01 DIAGNOSIS — E038 Other specified hypothyroidism: Secondary | ICD-10-CM

## 2017-01-01 DIAGNOSIS — A419 Sepsis, unspecified organism: Principal | ICD-10-CM

## 2017-01-01 DIAGNOSIS — E291 Testicular hypofunction: Secondary | ICD-10-CM

## 2017-01-01 DIAGNOSIS — G4733 Obstructive sleep apnea (adult) (pediatric): Secondary | ICD-10-CM

## 2017-01-01 DIAGNOSIS — D696 Thrombocytopenia, unspecified: Secondary | ICD-10-CM

## 2017-01-01 DIAGNOSIS — E2749 Other adrenocortical insufficiency: Secondary | ICD-10-CM

## 2017-01-01 DIAGNOSIS — N184 Chronic kidney disease, stage 4 (severe): Secondary | ICD-10-CM | POA: Diagnosis present

## 2017-01-01 DIAGNOSIS — I10 Essential (primary) hypertension: Secondary | ICD-10-CM

## 2017-01-01 DIAGNOSIS — E669 Obesity, unspecified: Secondary | ICD-10-CM

## 2017-01-01 DIAGNOSIS — L03115 Cellulitis of right lower limb: Secondary | ICD-10-CM

## 2017-01-01 DIAGNOSIS — E1169 Type 2 diabetes mellitus with other specified complication: Secondary | ICD-10-CM

## 2017-01-01 DIAGNOSIS — D352 Benign neoplasm of pituitary gland: Secondary | ICD-10-CM

## 2017-01-01 LAB — ECHOCARDIOGRAM COMPLETE
Ao-asc: 39 cm
CHL CUP DOP CALC LVOT VTI: 23.5 cm
EERAT: 8.69
EWDT: 229 ms
FS: 37 % (ref 28–44)
HEIGHTINCHES: 72 in
IVS/LV PW RATIO, ED: 1
LA ID, A-P, ES: 47 mm
LA vol: 56.7 mL
LADIAMINDEX: 2.05 cm/m2
LAVOLA4C: 54.4 mL
LAVOLIN: 24.8 mL/m2
LEFT ATRIUM END SYS DIAM: 47 mm
LV E/e' medial: 8.69
LV PW d: 14 mm — AB (ref 0.6–1.1)
LV SIMPSON'S DISK: 65
LV TDI E'LATERAL: 10
LV TDI E'MEDIAL: 10.1
LV dias vol index: 40 mL/m2
LV dias vol: 92 mL (ref 62–150)
LV sys vol: 33 mL (ref 21–61)
LVEEAVG: 8.69
LVELAT: 10 cm/s
LVOT area: 4.52 cm2
LVOT peak grad rest: 6 mmHg
LVOT peak vel: 127 cm/s
LVOTD: 24 mm
LVOTSV: 106 mL
LVSYSVOLIN: 14 mL/m2
MV Dec: 229
MV Peak grad: 3 mmHg
MVPKAVEL: 82.7 m/s
MVPKEVEL: 86.9 m/s
Stroke v: 59 ml
WEIGHTICAEL: 3809.6 [oz_av]

## 2017-01-01 LAB — CBC
HEMATOCRIT: 35 % — AB (ref 39.0–52.0)
HEMOGLOBIN: 11.4 g/dL — AB (ref 13.0–17.0)
MCH: 24.6 pg — AB (ref 26.0–34.0)
MCHC: 32.6 g/dL (ref 30.0–36.0)
MCV: 75.4 fL — AB (ref 78.0–100.0)
Platelets: 150 10*3/uL (ref 150–400)
RBC: 4.64 MIL/uL (ref 4.22–5.81)
RDW: 15.6 % — ABNORMAL HIGH (ref 11.5–15.5)
WBC: 7.3 10*3/uL (ref 4.0–10.5)

## 2017-01-01 LAB — COMPREHENSIVE METABOLIC PANEL
ALK PHOS: 70 U/L (ref 38–126)
ALT: 36 U/L (ref 17–63)
AST: 39 U/L (ref 15–41)
Albumin: 2.6 g/dL — ABNORMAL LOW (ref 3.5–5.0)
Anion gap: 6 (ref 5–15)
BUN: 14 mg/dL (ref 6–20)
CALCIUM: 7.8 mg/dL — AB (ref 8.9–10.3)
CO2: 21 mmol/L — AB (ref 22–32)
CREATININE: 2.16 mg/dL — AB (ref 0.61–1.24)
Chloride: 114 mmol/L — ABNORMAL HIGH (ref 101–111)
GFR calc Af Amer: 34 mL/min — ABNORMAL LOW (ref >60)
GFR calc non Af Amer: 30 mL/min — ABNORMAL LOW (ref >60)
GLUCOSE: 113 mg/dL — AB (ref 65–99)
Potassium: 3.5 mmol/L (ref 3.5–5.1)
SODIUM: 141 mmol/L (ref 135–145)
Total Bilirubin: 0.9 mg/dL (ref 0.3–1.2)
Total Protein: 5.9 g/dL — ABNORMAL LOW (ref 6.5–8.1)

## 2017-01-01 LAB — PROTIME-INR
INR: 1.33
Prothrombin Time: 16.6 seconds — ABNORMAL HIGH (ref 11.4–15.2)

## 2017-01-01 LAB — GLUCOSE, CAPILLARY
GLUCOSE-CAPILLARY: 102 mg/dL — AB (ref 65–99)
GLUCOSE-CAPILLARY: 91 mg/dL (ref 65–99)
GLUCOSE-CAPILLARY: 128 mg/dL — AB (ref 65–99)
Glucose-Capillary: 79 mg/dL (ref 65–99)

## 2017-01-01 MED ORDER — HYDROCORTISONE 10 MG PO TABS: 10.0000 mg | ORAL_TABLET | Freq: Every evening | ORAL | Status: DC

## 2017-01-01 MED ORDER — SACCHAROMYCES BOULARDII 250 MG PO CAPS
250.0000 mg | ORAL_CAPSULE | Freq: Two times a day (BID) | ORAL | Status: DC
  Administered 2017-01-01 – 2017-01-04 (×6): 250 mg via ORAL
  Filled 2017-01-01 (×6): qty 1

## 2017-01-01 MED ORDER — HYDROCORTISONE 5 MG PO TABS
15.0000 mg | ORAL_TABLET | Freq: Every day | ORAL | Status: DC
  Administered 2017-01-01 – 2017-01-02 (×2): 15 mg via ORAL
  Filled 2017-01-01 (×2): qty 1

## 2017-01-01 MED ORDER — HYDROCORTISONE 10 MG PO TABS
10.0000 mg | ORAL_TABLET | Freq: Every day | ORAL | Status: AC
  Administered 2017-01-01: 10 mg via ORAL
  Filled 2017-01-01: qty 1

## 2017-01-01 MED ORDER — HYDROCORTISONE 10 MG PO TABS
10.0000 mg | ORAL_TABLET | Freq: Every evening | ORAL | Status: DC
  Filled 2017-01-01: qty 1

## 2017-01-01 MED ORDER — WHITE PETROLATUM GEL
Status: AC
  Administered 2017-01-01: 22:00:00
  Filled 2017-01-01: qty 1

## 2017-01-01 NOTE — Progress Notes (Signed)
Pt. placed on CPAP in Auto mode@ >16/<4, humidity filled, pt. on room air, tolerating well, made aware to notify if needed.

## 2017-01-01 NOTE — Progress Notes (Signed)
Progress Note    Trevor Kelley  KZS:[id number] DOB: [birth year]  DOA: 12/31/2016 PCP: Mauricia Area, MD    Brief Narrative:   Chief complaint: Follow-up right lower extremity cellulitis  Medical records reviewed and are as summarized below:  Trevor Kelley is an 66 y.o. male with a PMH of stage IV CK D, type 2 diabetes with diabetic nephropathy, history of pituitary adenoma, hypertension, hyperlipidemia, hypothyroidism, OSA who was admitted 12/31/16 with sepsis secondary to right lower extremity cellulitis.  Assessment/Plan:   Principal Problem:   Sepsis ([place]) Secondary to right lower extremity cellulitis Pro calcitonin 11.36 but lactic acid reassuring. Does have leukocytosis and was admitted with sepsis. Fluid volume resuscitated and placed on empiric clindamycin. Follow-up blood cultures. Follow-up right lower extremity Doppler.  Active Problems:   CKD (chronic kidney disease), stage IV Creatinine 2.91 consistent with stage IV disease. Unclear baseline.    Diabetes mellitus type 2 in obese (HCC) Hemoglobin A1c 5.9%. Currently being managed with insulin sensitive SSI. CBGs 79-120.      OSA (obstructive sleep apnea) Continue CPAP at bedtime.    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 Resume hydrocortisone. Monitor closely for the need for stress dose steroids.    Diarrhea Follow-up C. difficile and GI pathogen testing.    Anemia Microcytic. Suspect this is related to chronic kidney disease. Ferritin 295, iron 11.    Obesity (BMI 30-39.9) Body mass index is 32.29 kg/m.     Hypertension Continue Norvasc, lisinopril, Coreg, Lasix.    Hyperlipidemia Continue Zocor.    Hypokalemia Repleted.    Thrombocytopenia Mild, monitor. May be related to infection.   Family Communication/Anticipated D/C date and plan/Code Status   DVT prophylaxis:  Heparin ordered. Code Status: Full Code.  Family Communication: Wife updated the bedside. Disposition Plan: Likely a three-day hospital stay given immunosuppression and severe cellulitis in the setting of adrenal insufficiency.   Medical Consultants:    None.   Anti-Infectives:    Clindamycin 12/31/16--->  Subjective:   Patient reports some pain and swelling to the right lower extremity. No nausea or vomiting. Appetite fair. No abdominal pain or dyspnea.  Objective:    Vitals:   12/31/16 1807 12/31/16 2044 01/01/17 0513 01/01/17 0808  BP: 110/75 116/65 (!) 92/50 116/64  Pulse: 80 72 73 71  Resp: 18 20 18 20   Temp: 98.9 F (37.2 C) 99.3 F (37.4 C) 99 F (37.2 C) 99.5 F (37.5 C)  TempSrc: Oral Oral Oral Oral  SpO2: 100% 98% 100% 98%  Weight:  108 kg (238 lb 1.6 oz)    Height:        Intake/Output Summary (Last 24 hours) at 01/01/17 0845 Last data filed at 01/01/17 0809  Gross per 24 hour  Intake          3391.25 ml  Output             1325 ml  Net          2066.25 ml   Filed Weights   12/30/16 2131 12/31/16 1153 12/31/16 2044  Weight: 108.9 kg (240 lb) 107.7 kg (237 lb 8 oz) 108 kg (238 lb 1.6 oz)    Exam: General: No acute distress. Sitting up in chair. Cardiovascular: Heart sounds show a regular rate, and rhythm. No gallops or rubs. No murmurs. No JVD. Lungs: Clear to auscultation bilaterally with good air movement. No rales, rhonchi or wheezes. Abdomen: Soft, nontender, nondistended with  normal active bowel sounds. No masses. No hepatosplenomegaly. Neurological: Alert and oriented 3. Moves all extremities 4 with equal strength. Cranial nerves II through XII grossly intact. Skin: Warm and dry. Right lower extremity erythema and swelling. Extremities: No clubbing or cyanosis. 2+ right lower extremity edema. Pedal pulses 2+. Psychiatric: Mood and affect are normal. Insight and judgment are fair.   Data Reviewed:   I have personally reviewed following  labs and imaging studies:  Labs: Labs show the following: Sodium 137, potassium 3.0, chloride 105, bicarbonate 22, BUN 18, creatinine 2.91, magnesium 1.7. AST 53, ALT 47, alkaline phosphatase 65, total bilirubin 1.2, albumin 3.3, protein 7.1. INR 1.52. WBC 15.2, hemoglobin 12.6, platelets 126. Troponin less than 0.03.  Anemia panel shows vitamin B-12 302, folate 16.4, ferritin 295, TIBC 176, iron 11, reticulocyte count 1.1.  Pro calcitonin 11.36, lactic acid 1.21 --->0.8  Microbiology No results found for this or any previous visit (from the past 240 hour(s)).  Procedures and diagnostic studies:  01/01/17: Chest x-ray: My independent review of the image shows: No obvious pneumonia.      01/01/17: 2-D echo:  Study Conclusions  - Left ventricle: The cavity size was mildly dilated. There was   moderate concentric hypertrophy. Systolic function was normal.   The estimated ejection fraction was in the range of 60% to 65%.   Wall motion was normal; there were no regional wall motion   abnormalities. Left ventricular diastolic function parameters   were normal. - Aorta: Ascending aortic diameter: 39 mm (S). - Ascending aorta: The ascending aorta was mildly dilated. - Mitral valve: There was trivial regurgitation. - Left atrium: The atrium was mildly dilated. - Tricuspid valve: There was trivial regurgitation. - Pulmonic valve: There was trivial regurgitation.  Medications:   . amLODipine  10 mg Oral BID  . calcitRIOL  0.25 mcg Oral Daily  . carvedilol  12.5 mg Oral BID WC  . furosemide  80 mg Oral BID  . heparin  5,000 Units Subcutaneous Q8H  . HYDROcodone-acetaminophen  1 tablet Oral QPM  . HYDROcodone-acetaminophen  1.5 tablet Oral q morning - 10a  . insulin aspart  0-9 Units Subcutaneous TID WC  . latanoprost  1 drop Both Eyes QHS  . levothyroxine  88 mcg Oral QAC breakfast  . lisinopril  40 mg Oral BID  . pneumococcal 23 valent vaccine  0.5 mL Intramuscular Tomorrow-1000    . potassium chloride SA  40 mEq Oral TID  . simvastatin  20 mg Oral Daily  . tamsulosin  0.4 mg Oral Daily  . testosterone  5 g Transdermal Daily  . timolol  1 drop Both Eyes BID   Continuous Infusions: . sodium chloride 125 mL/hr at 01/01/17 0253  . clindamycin (CLEOCIN) IV 600 mg (01/01/17 2706)     LOS: 1 day   Maya Scholer  [facility] Pager [phone]. If unable to reach me by pager, please call my cell phone at [phone].  *Please refer to [email or web address], password [email or web address] to get updated schedule on who will round on this patient, as hospitalists switch teams weekly. If 7PM-7AM, please contact night-coverage at [email or web address], password [email or web address] for any overnight needs.  01/01/2017, 8:45 AM

## 2017-01-01 NOTE — Progress Notes (Signed)
  Echocardiogram 2D Echocardiogram has been performed.  Trevor Kelley 01/01/2017, 8:51 AM

## 2017-01-02 LAB — GLUCOSE, CAPILLARY
GLUCOSE-CAPILLARY: 122 mg/dL — AB (ref 65–99)
GLUCOSE-CAPILLARY: 161 mg/dL — AB (ref 65–99)
Glucose-Capillary: 93 mg/dL (ref 65–99)
Glucose-Capillary: 103 mg/dL — ABNORMAL HIGH (ref 65–99)

## 2017-01-02 MED ORDER — HYDROCORTISONE NA SUCCINATE PF 100 MG IJ SOLR
100.0000 mg | Freq: Three times a day (TID) | INTRAMUSCULAR | Status: DC
  Administered 2017-01-02 – 2017-01-03 (×3): 100 mg via INTRAVENOUS
  Filled 2017-01-02 (×3): qty 2

## 2017-01-02 MED ORDER — POLYETHYLENE GLYCOL 3350 17 G PO PACK
17.0000 g | PACK | Freq: Every day | ORAL | Status: DC | PRN
  Administered 2017-01-02: 17 g via ORAL
  Filled 2017-01-02: qty 1

## 2017-01-02 NOTE — Progress Notes (Signed)
Pt states he hasn't had a bowel movement since admissions and is concerned of constipation. Dr. Rockne Menghini notified via text page.

## 2017-01-02 NOTE — Progress Notes (Signed)
Progress Note    ALIZE ACY  WNU:[id number] DOB: 12-20-1950  DOA: 12/31/2016 PCP: Mauricia Area, MD    Brief Narrative:   Chief complaint: Follow-up right lower extremity cellulitis  Medical records reviewed and are as summarized below:  JAZON JIPSON is an 66 y.o. male with a PMH of stage IV CK D, type 2 diabetes with diabetic nephropathy, history of pituitary adenoma, hypertension, hyperlipidemia, hypothyroidism, OSA who was admitted 12/31/16 with sepsis secondary to right lower extremity cellulitis.  Assessment/Plan:   Principal Problem:   Sepsis ([place]) Secondary to right lower extremity cellulitis Pro calcitonin 11.36 but lactic acid reassuring. Does have leukocytosis and was admitted with sepsis. Fluid volume resuscitated and placed on empiric clindamycin. Blood cultures negative to date. Right lower extremity Doppler negative for DVT.  Active Problems:   CKD (chronic kidney disease), stage IV Creatinine 2.91 consistent with stage IV disease. Unclear baseline.    Diabetes mellitus type 2 in obese (HCC) Hemoglobin A1c 5.9%. Currently being managed with insulin sensitive SSI. CBGs 79-120.      OSA (obstructive sleep apnea) Continue CPAP at bedtime.    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 Hold anti-hypertensives (Coreg ok) and initiate stress dose steroids.    Diarrhea Follow-up C. difficile and GI pathogen testing.    Anemia Microcytic. Suspect this is related to chronic kidney disease. Ferritin 295, iron 11.    Obesity (BMI 30-39.9) Body mass index is 32.98 kg/m.     Hypertension Hypotensive. Hold Norvasc, lisinopril, Lasix. Give Coreg only.    Hyperlipidemia Continue Zocor.    Hypokalemia Repleted.    Thrombocytopenia Mild, monitor. May be related to infection.   Family Communication/Anticipated D/C date and  plan/Code Status   DVT prophylaxis: Heparin ordered. Code Status: Full Code.  Family Communication: Wife updated the bedside 01/01/17, no family present today. Disposition Plan: Likely a three-day hospital stay given immunosuppression and severe cellulitis in the setting of adrenal insufficiency.   Medical Consultants:    None.   Anti-Infectives:    Clindamycin 12/31/16--->  Subjective:   No nausea, vomiting or diarrhea.  RLE less sore today, but feels a bit stiff. Appetite remains poor, but he is making an effort to eat.  Objective:    Vitals:   01/01/17 2141 01/01/17 2242 01/02/17 0530 01/02/17 0816  BP: 98/66  110/69 103/70  Pulse: 65  67 66  Resp: 20 18 20 20   Temp: 98 F (36.7 C)  98.5 F (36.9 C) 98.4 F (36.9 C)  TempSrc: Oral  Oral Oral  SpO2: 95% 95% 100% 100%  Weight: 110.3 kg (243 lb 3.2 oz)     Height:        Intake/Output Summary (Last 24 hours) at 01/02/17 0843 Last data filed at 01/02/17 0600  Gross per 24 hour  Intake             4220 ml  Output             1450 ml  Net             2770 ml   Filed Weights   12/31/16 1153 12/31/16 2044 01/01/17 2141  Weight: 107.7 kg (237 lb 8 oz) 108 kg (238 lb 1.6 oz) 110.3 kg (243 lb 3.2 oz)    Exam: General: No acute distress. Sitting up in chair. Cardiovascular: Heart sounds show a regular rate, and rhythm. No gallops or rubs. No murmurs. No JVD.  Lungs: Clear to auscultation bilaterally with good air movement. No rales, rhonchi or wheezes. Abdomen: Soft, nontender, nondistended with normal active bowel sounds. No masses. No hepatosplenomegaly. Neurological: Alert and oriented 3. Moves all extremities 4 with equal strength. Cranial nerves II through XII grossly intact. Skin: Warm and dry. Right lower extremity erythema and swelling. Extremities: No clubbing or cyanosis. 2+ right lower extremity edema. Pedal pulses 2+. Psychiatric: Mood and affect are normal. Insight and judgment are fair.  RLE still  much more swollen than right  Data Reviewed:   I have personally reviewed following labs and imaging studies:  Labs: Labs show the following: Sodium 137, potassium 3.0, chloride 105, bicarbonate 22, BUN 18, creatinine 2.91, magnesium 1.7. AST 53, ALT 47, alkaline phosphatase 65, total bilirubin 1.2, albumin 3.3, protein 7.1. INR 1.52. WBC 15.2, hemoglobin 12.6, platelets 126. Troponin less than 0.03.  Anemia panel shows vitamin B-12 302, folate 16.4, ferritin 295, TIBC 176, iron 11, reticulocyte count 1.1.  Pro calcitonin 11.36, lactic acid 1.21 --->0.8  Microbiology Recent Results (from the past 240 hour(s))  Culture, blood (Routine x 2)     Status: None (Preliminary result)   Collection Time: 12/30/16  9:35 PM  Result Value Ref Range Status   Specimen Description BLOOD LEFT ARM  Final   Special Requests   Final    BOTTLES DRAWN AEROBIC AND ANAEROBIC Blood Culture adequate volume   Culture NO GROWTH 1 DAY  Final   Report Status PENDING  Incomplete  Culture, blood (Routine x 2)     Status: None (Preliminary result)   Collection Time: 12/30/16  9:42 PM  Result Value Ref Range Status   Specimen Description BLOOD RIGHT ARM  Final   Special Requests IN PEDIATRIC BOTTLE Blood Culture adequate volume  Final   Culture NO GROWTH 1 DAY  Final   Report Status PENDING  Incomplete  Culture, blood (x 2)     Status: None (Preliminary result)   Collection Time: 12/31/16  9:35 AM  Result Value Ref Range Status   Specimen Description BLOOD LEFT ANTECUBITAL  Final   Special Requests   Final    BOTTLES DRAWN AEROBIC AND ANAEROBIC Blood Culture adequate volume   Culture NO GROWTH < 24 HOURS  Final   Report Status PENDING  Incomplete  Culture, blood (x 2)     Status: None (Preliminary result)   Collection Time: 12/31/16  9:45 AM  Result Value Ref Range Status   Specimen Description BLOOD LEFT ARM  Final   Special Requests   Final    BOTTLES DRAWN AEROBIC AND ANAEROBIC Blood Culture adequate  volume   Culture NO GROWTH < 24 HOURS  Final   Report Status PENDING  Incomplete    Procedures and diagnostic studies:  01/01/17: Chest x-ray: My independent review of the image shows: No obvious pneumonia.      01/01/17: 2-D echo:  Study Conclusions  - Left ventricle: The cavity size was mildly dilated. There was   moderate concentric hypertrophy. Systolic function was normal.   The estimated ejection fraction was in the range of 60% to 65%.   Wall motion was normal; there were no regional wall motion   abnormalities. Left ventricular diastolic function parameters   were normal. - Aorta: Ascending aortic diameter: 39 mm (S). - Ascending aorta: The ascending aorta was mildly dilated. - Mitral valve: There was trivial regurgitation. - Left atrium: The atrium was mildly dilated. - Tricuspid valve: There was trivial regurgitation. - Pulmonic valve:  There was trivial regurgitation.  Medications:   . amLODipine  10 mg Oral BID  . calcitRIOL  0.25 mcg Oral Daily  . carvedilol  12.5 mg Oral BID WC  . furosemide  80 mg Oral BID  . heparin  5,000 Units Subcutaneous Q8H  . HYDROcodone-acetaminophen  1 tablet Oral QPM  . HYDROcodone-acetaminophen  1.5 tablet Oral q morning - 10a  . hydrocortisone  10 mg Oral QPM  . hydrocortisone  15 mg Oral Daily  . insulin aspart  0-9 Units Subcutaneous TID WC  . latanoprost  1 drop Both Eyes QHS  . levothyroxine  88 mcg Oral QAC breakfast  . lisinopril  40 mg Oral BID  . potassium chloride SA  40 mEq Oral TID  . saccharomyces boulardii  250 mg Oral BID  . simvastatin  20 mg Oral Daily  . tamsulosin  0.4 mg Oral Daily  . testosterone  5 g Transdermal Daily  . timolol  1 drop Both Eyes BID   Continuous Infusions: . sodium chloride 125 mL/hr at 01/02/17 0343  . clindamycin (CLEOCIN) IV Stopped (01/02/17 0559)     LOS: 2 days   Lakara Weiland  [facility] Pager [phone]. If unable to reach me by pager, please call my cell  phone at [phone].  *Please refer to [email or web address], password [email or web address] to get updated schedule on who will round on this patient, as hospitalists switch teams weekly. If 7PM-7AM, please contact night-coverage at [email or web address], password [email or web address] for any overnight needs.  01/02/2017, 8:43 AM

## 2017-01-02 NOTE — Progress Notes (Signed)
MD gave verbal order to give patient miralax 17 g Daily PRN for constipation. Orders placed and followed. Will continue to monitor.

## 2017-01-02 NOTE — Progress Notes (Signed)
Notified MD Patient's BP 103/70, HR 66. MD notified. Orders placed. Will continue to monitor.

## 2017-01-03 ENCOUNTER — Inpatient Hospital Stay (HOSPITAL_COMMUNITY): Payer: Medicare Other

## 2017-01-03 DIAGNOSIS — N179 Acute kidney failure, unspecified: Secondary | ICD-10-CM | POA: Diagnosis present

## 2017-01-03 LAB — GLUCOSE, CAPILLARY
GLUCOSE-CAPILLARY: 117 mg/dL — AB (ref 65–99)
GLUCOSE-CAPILLARY: 137 mg/dL — AB (ref 65–99)
Glucose-Capillary: 120 mg/dL — ABNORMAL HIGH (ref 65–99)
Glucose-Capillary: 119 mg/dL — ABNORMAL HIGH (ref 65–99)

## 2017-01-03 LAB — CBC
HCT: 34.5 % — ABNORMAL LOW (ref 39.0–52.0)
Hemoglobin: 11.3 g/dL — ABNORMAL LOW (ref 13.0–17.0)
MCH: 24.6 pg — AB (ref 26.0–34.0)
MCHC: 32.8 g/dL (ref 30.0–36.0)
MCV: 75.2 fL — AB (ref 78.0–100.0)
PLATELETS: 206 10*3/uL (ref 150–400)
RBC: 4.59 MIL/uL (ref 4.22–5.81)
RDW: 15.7 % — AB (ref 11.5–15.5)
WBC: 6.8 10*3/uL (ref 4.0–10.5)

## 2017-01-03 LAB — BASIC METABOLIC PANEL
ANION GAP: 9 (ref 5–15)
BUN: 9 mg/dL (ref 6–20)
CALCIUM: 8.5 mg/dL — AB (ref 8.9–10.3)
CO2: 21 mmol/L — AB (ref 22–32)
CREATININE: 1.54 mg/dL — AB (ref 0.61–1.24)
Chloride: 111 mmol/L (ref 101–111)
GFR calc non Af Amer: 45 mL/min — ABNORMAL LOW (ref >60)
GFR, EST AFRICAN AMERICAN: 52 mL/min — AB (ref >60)
GLUCOSE: 143 mg/dL — AB (ref 65–99)
Potassium: 3.9 mmol/L (ref 3.5–5.1)
SODIUM: 141 mmol/L (ref 135–145)

## 2017-01-03 MED ORDER — MAGNESIUM CITRATE PO SOLN
1.0000 | Freq: Once | ORAL | Status: AC
Start: 2017-01-03 — End: 2017-01-03
  Administered 2017-01-03: 1 via ORAL
  Filled 2017-01-03: qty 296

## 2017-01-03 MED ORDER — HYDROCORTISONE NA SUCCINATE PF 100 MG IJ SOLR
50.0000 mg | Freq: Three times a day (TID) | INTRAMUSCULAR | Status: DC
  Administered 2017-01-03 – 2017-01-04 (×3): 50 mg via INTRAVENOUS
  Filled 2017-01-03 (×3): qty 2

## 2017-01-03 MED ORDER — IOPAMIDOL (ISOVUE-300) INJECTION 61%
INTRAVENOUS | Status: AC
  Administered 2017-01-03: 100 mL
  Filled 2017-01-03: qty 100

## 2017-01-03 NOTE — Progress Notes (Signed)
Assisted pt with CPAP, added water to chamber. Pt states settings are comfortable for him. Advised pt to notify RT if he needed any adjustments made. RT Will continue to monitor.

## 2017-01-03 NOTE — Progress Notes (Signed)
Progress Note    EMMANUELL Kelley  JEH:[id number] DOB: 1950-12-18  DOA: 12/31/2016 PCP: Mauricia Area, MD    Brief Narrative:   Chief complaint: Follow-up right lower extremity cellulitis  Medical records reviewed and are as summarized below:  Trevor Kelley is an 66 y.o. male with a PMH of stage IV CK D, type 2 diabetes with diabetic nephropathy, history of pituitary adenoma, hypertension, hyperlipidemia, hypothyroidism, OSA who was admitted 12/31/16 with sepsis secondary to right lower extremity cellulitis.  Assessment/Plan:   Principal Problem:   Sepsis ([place]) Secondary to right lower extremity cellulitis Pro calcitonin 11.36 but lactic acid reassuring. Fluid volume resuscitated and placed on empiric clindamycin. Blood cultures negative to date. Right lower extremity Doppler negative for DVT.No significant improvement in right lower extremity swelling and erythema. We'll obtain CT of the right lower extremity to rule out deep tissue infection/abscess. Blood cultures negative to date.  Active Problems:   AKI/CKD (chronic kidney disease), unclear stage Creatinine much improved, down to 1.54, unclear baseline. AKI likely from sepsis.     Diabetes mellitus type 2 in obese (HCC) Hemoglobin A1c 5.9%. Currently being managed with insulin sensitive SSI. CBGs 79-120.      OSA (obstructive sleep apnea) Continue CPAP at bedtime.    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 Continue stress dose steroids, wean as tolerated. BP improved with holding antihypertensives and administering stress dose steroids.    Diarrhea Resolved. Discontinue stool studies and contact precautions.    Anemia Microcytic. Suspect this is related to chronic kidney disease. Ferritin 295, iron 11.    Obesity (BMI 30-39.9) Body mass index is 33.12 kg/m.      Hypertension Hypotensive. Hold Norvasc, lisinopril, Lasix. Give Coreg only.    Hyperlipidemia Continue Zocor.    Hypokalemia Repleted.    Thrombocytopenia Mild, monitor. May be related to infection.   Family Communication/Anticipated D/C date and plan/Code Status   DVT prophylaxis: Heparin ordered. Code Status: Full Code.  Family Communication: Wife updated the bedside 01/01/17, no family present today. Disposition Plan: No improvement in appearance of right lower extremity yet, will need improvement before discharge can be considered.   Medical Consultants:    None.   Anti-Infectives:    Clindamycin 12/31/16--->  Subjective:   ContinuesTo report that his right leg is stiff, but feels better and he can bear weight on it now. Appetite remains poor. No dyspnea. No further nausea, vomiting vomiting, or diarrhea. Feels constipated now.  Objective:    Vitals:   01/02/17 2035 01/02/17 2346 01/03/17 0700 01/03/17 0800  BP: 117/73  119/73 135/87  Pulse: 64 63 85 85  Resp: 20 18 20    Temp: 97.7 F (36.5 C)  97.9 F (36.6 C) 97.9 F (36.6 C)  TempSrc: Oral  Oral Oral  SpO2: 99% 98% 100% 99%  Weight: 110.8 kg (244 lb 3.2 oz)     Height:        Intake/Output Summary (Last 24 hours) at 01/03/17 0811 Last data filed at 01/03/17 0600  Gross per 24 hour  Intake             4538 ml  Output             1225 ml  Net             3313 ml   Filed Weights   12/31/16 2044 01/01/17 2141 01/02/17 2035  Weight: 108 kg (238 lb 1.6 oz)  110.3 kg (243 lb 3.2 oz) 110.8 kg (244 lb 3.2 oz)    Exam: General: No acute distress. Cardiovascular: Heart sounds show a regular rate, and rhythm. No gallops or rubs. No murmurs. No JVD. Lungs: Clear to auscultation bilaterally with good air movement. No rales, rhonchi or wheezes. Abdomen: Soft, nontender, nondistended with normal active bowel sounds. No masses. No hepatosplenomegaly. Neurological: Alert and oriented 3. Moves all extremities  4 with equal strength. Cranial nerves II through XII grossly intact. Skin: Warm and dry. No rashes or lesions. Extremities: Right lower extremity much more swollen than left, erythematous. Psychiatric: Mood and affect are normal. Insight and judgment are normal.   Data Reviewed:   I have personally reviewed following labs and imaging studies:  Labs: Labs show the following: Sodium 141, potassium 3.9, chloride 111, bicarbonate 21, BUN 9, creatinine 1.54, glucose 143. WBC 6.8, hemoglobin 11.3, hematocrit 34.5, platelets 206.  Anemia panel shows vitamin B-12 302, folate 16.4, ferritin 295, TIBC 176, iron 11, reticulocyte count 1.1.  Pro calcitonin 11.36, lactic acid 1.21 --->0.8  Microbiology 12/31/16: Blood culture right arm: No growth. 12/30/16: Blood culture right arm: No growth. 12/30/16: Blood cultures left antecubital: No growth.  Procedures and diagnostic studies:  01/01/17: Chest x-ray: My independent review of the image shows: No obvious pneumonia.      01/01/17: 2-D echo:  Study Conclusions  - Left ventricle: The cavity size was mildly dilated. There was   moderate concentric hypertrophy. Systolic function was normal.   The estimated ejection fraction was in the range of 60% to 65%.   Wall motion was normal; there were no regional wall motion   abnormalities. Left ventricular diastolic function parameters   were normal. - Aorta: Ascending aortic diameter: 39 mm (S). - Ascending aorta: The ascending aorta was mildly dilated. - Mitral valve: There was trivial regurgitation. - Left atrium: The atrium was mildly dilated. - Tricuspid valve: There was trivial regurgitation. - Pulmonic valve: There was trivial regurgitation.  Medications:   . calcitRIOL  0.25 mcg Oral Daily  . carvedilol  12.5 mg Oral BID WC  . heparin  5,000 Units Subcutaneous Q8H  . HYDROcodone-acetaminophen  1 tablet Oral QPM  . HYDROcodone-acetaminophen  1.5 tablet Oral q morning - 10a  .  hydrocortisone sod succinate (SOLU-CORTEF) inj  100 mg Intravenous Q8H  . insulin aspart  0-9 Units Subcutaneous TID WC  . latanoprost  1 drop Both Eyes QHS  . levothyroxine  88 mcg Oral QAC breakfast  . potassium chloride SA  40 mEq Oral TID  . saccharomyces boulardii  250 mg Oral BID  . simvastatin  20 mg Oral Daily  . tamsulosin  0.4 mg Oral Daily  . testosterone  5 g Transdermal Daily  . timolol  1 drop Both Eyes BID   Continuous Infusions: . sodium chloride 125 mL/hr at 01/03/17 0521  . clindamycin (CLEOCIN) IV Stopped (01/03/17 0551)     LOS: 3 days   Layton Tappan  [facility] Pager [phone]. If unable to reach me by pager, please call my cell phone at [phone].  *Please refer to [email or web address], password [email or web address] to get updated schedule on who will round on this patient, as hospitalists switch teams weekly. If 7PM-7AM, please contact night-coverage at [email or web address], password [email or web address] for any overnight needs.  01/03/2017, 8:11 AM

## 2017-01-04 LAB — BASIC METABOLIC PANEL
Anion gap: 8 (ref 5–15)
BUN: 13 mg/dL (ref 6–20)
CO2: 22 mmol/L (ref 22–32)
Calcium: 8.1 mg/dL — ABNORMAL LOW (ref 8.9–10.3)
Chloride: 112 mmol/L — ABNORMAL HIGH (ref 101–111)
Creatinine, Ser: 1.46 mg/dL — ABNORMAL HIGH (ref 0.61–1.24)
GFR calc Af Amer: 55 mL/min — ABNORMAL LOW (ref >60)
GFR, EST NON AFRICAN AMERICAN: 48 mL/min — AB (ref >60)
GLUCOSE: 126 mg/dL — AB (ref 65–99)
Potassium: 3.8 mmol/L (ref 3.5–5.1)
Sodium: 142 mmol/L (ref 135–145)

## 2017-01-04 LAB — GLUCOSE, CAPILLARY: Glucose-Capillary: 105 mg/dL — ABNORMAL HIGH (ref 65–99)

## 2017-01-04 MED ORDER — HYDROCORTISONE 10 MG PO TABS: 10.0000 mg | ORAL_TABLET | Freq: Every evening | ORAL | Status: DC

## 2017-01-04 MED ORDER — HYDROCORTISONE 5 MG PO TABS
15.0000 mg | ORAL_TABLET | Freq: Every day | ORAL | Status: DC
  Administered 2017-01-04: 15 mg via ORAL
  Filled 2017-01-04: qty 1

## 2017-01-04 MED ORDER — CLINDAMYCIN HCL 300 MG PO CAPS: 300.0000 mg | ORAL_CAPSULE | Freq: Four times a day (QID) | ORAL | Status: DC

## 2017-01-04 MED ORDER — HYDROCORTISONE 5 MG PO TABS: 15.0000 mg | ORAL_TABLET | Freq: Every day | ORAL | Status: DC

## 2017-01-04 MED ORDER — CLINDAMYCIN HCL 300 MG PO CAPS: 300.0000 mg | ORAL_CAPSULE | Freq: Four times a day (QID) | ORAL | 0 refills | Status: DC

## 2017-01-04 MED ORDER — HYDROCORTISONE NA SUCCINATE PF 100 MG IJ SOLR: 25.0000 mg | Freq: Three times a day (TID) | INTRAMUSCULAR | Status: DC

## 2017-01-04 NOTE — Progress Notes (Signed)
Trevor Kelley to be D/C'd Home per MD order.  Discussed prescriptions and follow up appointments with the patient. Prescriptions given to patient, medication list explained in detail. Pt verbalized understanding.  Allergies as of 01/04/2017   No Known Allergies     Medication List    TAKE these medications   amLODipine 10 MG tablet Commonly known as:  NORVASC Take 10 mg by mouth 2 (two) times daily.   bimatoprost 0.01 % Soln Commonly known as:  LUMIGAN Place 1 drop into both eyes at bedtime.   calcitRIOL 0.25 MCG capsule Commonly known as:  ROCALTROL Take 0.25 mcg by mouth daily.   carvedilol 12.5 MG tablet Commonly known as:  COREG Take 12.5 mg by mouth 2 (two) times daily with a meal.   clindamycin 300 MG capsule Commonly known as:  CLEOCIN Take 1 capsule (300 mg total) by mouth 4 (four) times daily.   furosemide 80 MG tablet Commonly known as:  LASIX Take 80 mg by mouth 2 (two) times daily.   HYDROcodone-acetaminophen 10-325 MG tablet Commonly known as:  NORCO Take 1-1.5 tablets by mouth See admin instructions. Take 1 and 1/2 tablets every morning and take 1 tablet every evening   hydrocortisone 10 MG tablet Commonly known as:  CORTEF Take 1 tablet (10 mg total) by mouth every evening.   hydrocortisone 5 MG tablet Commonly known as:  CORTEF Take 3 tablets (15 mg total) by mouth daily.   levothyroxine 88 MCG tablet Commonly known as:  SYNTHROID, LEVOTHROID Take 88 mcg by mouth daily before breakfast.   lisinopril 40 MG tablet Commonly known as:  PRINIVIL,ZESTRIL Take 40 mg by mouth 2 (two) times daily.   potassium chloride SA 20 MEQ tablet Commonly known as:  K-DUR,KLOR-CON Take 40 mEq by mouth 3 (three) times daily.   simvastatin 20 MG tablet Commonly known as:  ZOCOR Take 20 mg by mouth daily.   tamsulosin 0.4 MG Caps capsule Commonly known as:  FLOMAX Take 0.4 mg by mouth daily.   Testosterone 20.25 MG/1.25GM (1.62%) Gel Apply 1 packet  topically daily.   timolol 0.5 % ophthalmic solution Commonly known as:  TIMOPTIC Place 1 drop into both eyes 2 (two) times daily.            Discharge Care Instructions        Start     Ordered   01/04/17 0000  hydrocortisone (CORTEF) 10 MG tablet  Every evening     01/04/17 0954   01/04/17 0000  hydrocortisone (CORTEF) 5 MG tablet  Daily     01/04/17 0954   01/04/17 0000  clindamycin (CLEOCIN) 300 MG capsule  4 times daily     01/04/17 0954   01/04/17 0000  Increase activity slowly     01/04/17 0954   01/04/17 0000  Diet - low sodium heart healthy     01/04/17 0954   01/04/17 0000  Discharge instructions    Comments:  RESUME LASIX (Furosemide) AND POTASSIUM 01/05/17.   01/04/17 0954   01/04/17 0000  Call MD for:  temperature >100.4     01/04/17 0954   01/04/17 0000  Call MD for:  severe uncontrolled pain     01/04/17 0954   01/04/17 0000  Call MD for:  extreme fatigue     01/04/17 0954      Vitals:   01/04/17 0432 01/04/17 1007  BP: 134/85 134/88  Pulse: 73 62  Resp: 18 20  Temp: 97.9 F (36.6 C)  98.3 F (36.8 C)  SpO2: 99% 100%    Skin clean, dry and intact without evidence of skin break down, no evidence of skin tears noted. IV catheter discontinued intact. Site without signs and symptoms of complications. Dressing and pressure applied. Pt denies pain at this time. No complaints noted.  An After Visit Summary was printed and given to the patient. Patient escorted via [name], and D/C home via private auto.  [name], RN [facility] Phone [phone]

## 2017-01-04 NOTE — Discharge Instructions (Signed)

## 2017-01-04 NOTE — Discharge Summary (Signed)
Physician Discharge Summary  Trevor Kelley BWI:[id number] DOB: [birth year] DOA: 12/31/2016  PCP: Trevor Area, MD  Admit date: 12/31/2016 Discharge date: 01/04/2017  Admitted From: Home Discharge disposition: Home   Recommendations for Outpatient Follow-Up:   1. Recheck kidney function in 1 week.   Discharge Diagnosis:   Principal Problem:   Sepsis ([place]) Active Problems:   Diabetes mellitus type 2 in obese (HCC)   Obesity (BMI 30-39.9)   OSA (obstructive sleep apnea)   Pituitary adenoma (HCC)   Secondary adrenal insufficiency (HCC)   Secondary hypothyroidism   Secondary male hypogonadism   Cellulitis   Diarrhea   Anemia   Thrombocytopenia (HCC)   Hypokalemia   AKI (acute kidney injury) ([place])  Discharge Condition: Improved.  Diet recommendation: Low sodium, heart healthy.  Carbohydrate-modified.    History of Present Illness:   Trevor Kelley is an 66 y.o. male with a PMH of stage IV CK D, type 2 diabetes with diabetic nephropathy, history of pituitary adenoma, hypertension, hyperlipidemia, hypothyroidism, OSA who was admitted 12/31/16 with sepsis secondary to right lower extremity cellulitis.  Hospital Course by Problem:   Principal Problem:   Sepsis ([place]) Secondary to right lower extremity cellulitis Fluid volume resuscitated and placed on empiric clindamycin.  Right lower extremity Doppler negative for DVT. CT findings consistent with cellulitis, no osteomyelitis or abscess. Blood cultures negative to date. Discharged home on 4 more days of Clindamycin.  Active Problems:   AKI/CKD (chronic kidney disease), unclear stage (likely stage III CKD) Creatinine much improved, down to 1.46, unclear baseline. AKI likely from sepsis. GFR 48, but improving daily.    Diabetes mellitus type 2 in obese (HCC) Hemoglobin A1c 5.9%. Currently being managed with insulin sensitive SSI. CBGs 105-137. Resume home regimen at discharge.      OSA (obstructive sleep  apnea) Continue CPAP at bedtime.    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    Diarrhea Resolved. No evidence of infectious etiology.    Anemia Microcytic. Suspect this is related to chronic kidney disease. Ferritin 295, iron 11.    Obesity (BMI 30-39.9) Body mass index is 33.13 kg/m.     Hypertension Resume home regimen except for Lasix, which he was instructed to resume 01/05/17.    Hyperlipidemia Continue Zocor.    Hypokalemia Repleted.    Thrombocytopenia Mild. May be related to infection.    Medical Consultants:    None.   Discharge Exam:   Vitals:   01/04/17 0432 01/04/17 1007  BP: 134/85 134/88  Pulse: 73 62  Resp: 18 20  Temp: 97.9 F (36.6 C) 98.3 F (36.8 C)  SpO2: 99% 100%   Vitals:   01/03/17 1852 01/03/17 2036 01/04/17 0432 01/04/17 1007  BP: 127/81 116/75 134/85 134/88  Pulse: 76 68 73 62  Resp: 18 18 18 20   Temp: 97.9 F (36.6 C) 98.2 F (36.8 C) 97.9 F (36.6 C) 98.3 F (36.8 C)  TempSrc: Oral Oral Oral Oral  SpO2: 99% 100% 99% 100%  Weight:  110.8 kg (244 lb 4.8 oz)    Height:       General: No acute distress. Cardiovascular: Heart sounds show a regular rate, and rhythm. No gallops or rubs. No murmurs. No JVD. Lungs: Clear to auscultation bilaterally with good air movement. No rales, rhonchi or wheezes. Abdomen: Soft, nontender, nondistended with normal active bowel sounds. No masses. No hepatosplenomegaly. Neurological: Alert and oriented 3. Moves all extremities 4 with equal  strength. Cranial nerves II through XII grossly intact. Skin: Warm and dry. No rashes or lesions other than RLE as described. Extremities: RLE swollen, erythema improving. Psychiatric: Mood and affect are normal. Insight and judgment are good.    The results of significant diagnostics from this hospitalization (including  imaging, microbiology, ancillary and laboratory) are listed below for reference.     Procedures and Diagnostic Studies:   X-ray Chest Pa And Lateral  Result Date: 01/01/2017 CLINICAL DATA:  Sepsis. EXAM: CHEST  2 VIEW COMPARISON:  None. FINDINGS: Small densities at the costophrenic angles could represent mild atelectasis or scarring. Otherwise, the lungs are clear. Heart and mediastinum are within normal limits. The trachea is midline. Bridging osteophytes in the thoracic spine. IMPRESSION: Small basilar densities suggestive for mild atelectasis or scarring. No focal airspace disease. Electronically Signed   By: Markus Daft M.D.   On: 01/01/2017 14:56     Labs:   Basic Metabolic Panel:  Recent Labs Lab 12/30/16 2134 12/31/16 0925 01/01/17 1051 01/03/17 0854 01/04/17 0407  NA 137  --  141 141 142  K 3.0*  --  3.5 3.9 3.8  CL 105  --  114* 111 112*  CO2 22  --  21* 21* 22  GLUCOSE 103*  --  113* 143* 126*  BUN 18  --  14 9 13   CREATININE 2.91*  --  2.16* 1.54* 1.46*  CALCIUM 8.3*  --  7.8* 8.5* 8.1*  MG  --  1.7  --   --   --    GFR Estimated Creatinine Clearance: 62.3 mL/min (A) (by C-G formula based on SCr of 1.46 mg/dL (H)). Liver Function Tests:  Recent Labs Lab 12/30/16 2134 01/01/17 1051  AST 53* 39  ALT 47 36  ALKPHOS 65 70  BILITOT 1.2 0.9  PROT 7.1 5.9*  ALBUMIN 3.3* 2.6*   Coagulation profile  Recent Labs Lab 12/30/16 2134 12/31/16 0925 01/01/17 1051  INR 1.49 1.52 1.33    CBC:  Recent Labs Lab 12/30/16 2134 01/01/17 1051 01/03/17 0854  WBC 15.2* 7.3 6.8  NEUTROABS 11.0*  --   --   HGB 12.6* 11.4* 11.3*  HCT 38.3* 35.0* 34.5*  MCV 75.2* 75.4* 75.2*  PLT 126* 150 206   Cardiac Enzymes:  Recent Labs Lab 12/31/16 0925  TROPONINI <0.03   CBG:  Recent Labs Lab 01/03/17 0807 01/03/17 1210 01/03/17 1734 01/03/17 2033 01/04/17 0736  GLUCAP 117* 137* 120* 119* 105*   Microbiology Recent Results (from the past 240 hour(s))    Culture, blood (Routine x 2)     Status: None (Preliminary result)   Collection Time: 12/30/16  9:35 PM  Result Value Ref Range Status   Specimen Description BLOOD LEFT ARM  Final   Special Requests   Final    BOTTLES DRAWN AEROBIC AND ANAEROBIC Blood Culture adequate volume   Culture NO GROWTH 3 DAYS  Final   Report Status PENDING  Incomplete  Culture, blood (Routine x 2)     Status: None (Preliminary result)   Collection Time: 12/30/16  9:42 PM  Result Value Ref Range Status   Specimen Description BLOOD RIGHT ARM  Final   Special Requests IN PEDIATRIC BOTTLE Blood Culture adequate volume  Final   Culture NO GROWTH 3 DAYS  Final   Report Status PENDING  Incomplete  Culture, blood (x 2)     Status: None (Preliminary result)   Collection Time: 12/31/16  9:35 AM  Result Value Ref Range Status  Specimen Description BLOOD LEFT ANTECUBITAL  Final   Special Requests   Final    BOTTLES DRAWN AEROBIC AND ANAEROBIC Blood Culture adequate volume   Culture NO GROWTH 3 DAYS  Final   Report Status PENDING  Incomplete  Culture, blood (x 2)     Status: None (Preliminary result)   Collection Time: 12/31/16  9:45 AM  Result Value Ref Range Status   Specimen Description BLOOD LEFT ARM  Final   Special Requests   Final    BOTTLES DRAWN AEROBIC AND ANAEROBIC Blood Culture adequate volume   Culture NO GROWTH 3 DAYS  Final   Report Status PENDING  Incomplete     Discharge Instructions:   Discharge Instructions    Call MD for:  extreme fatigue    Complete by:  As directed    Call MD for:  severe uncontrolled pain    Complete by:  As directed    Call MD for:  temperature >100.4    Complete by:  As directed    Diet - low sodium heart healthy    Complete by:  As directed    Discharge instructions    Complete by:  As directed    RESUME LASIX (Furosemide) AND POTASSIUM 01/05/17.   Increase activity slowly    Complete by:  As directed      Allergies as of 01/04/2017   No Known Allergies      Medication List    TAKE these medications   amLODipine 10 MG tablet Commonly known as:  NORVASC Take 10 mg by mouth 2 (two) times daily.   bimatoprost 0.01 % Soln Commonly known as:  LUMIGAN Place 1 drop into both eyes at bedtime.   calcitRIOL 0.25 MCG capsule Commonly known as:  ROCALTROL Take 0.25 mcg by mouth daily.   carvedilol 12.5 MG tablet Commonly known as:  COREG Take 12.5 mg by mouth 2 (two) times daily with a meal.   clindamycin 300 MG capsule Commonly known as:  CLEOCIN Take 1 capsule (300 mg total) by mouth 4 (four) times daily.   furosemide 80 MG tablet Commonly known as:  LASIX Take 80 mg by mouth 2 (two) times daily.   HYDROcodone-acetaminophen 10-325 MG tablet Commonly known as:  NORCO Take 1-1.5 tablets by mouth See admin instructions. Take 1 and 1/2 tablets every morning and take 1 tablet every evening   hydrocortisone 10 MG tablet Commonly known as:  CORTEF Take 1 tablet (10 mg total) by mouth every evening.   hydrocortisone 5 MG tablet Commonly known as:  CORTEF Take 3 tablets (15 mg total) by mouth daily.   levothyroxine 88 MCG tablet Commonly known as:  SYNTHROID, LEVOTHROID Take 88 mcg by mouth daily before breakfast.   lisinopril 40 MG tablet Commonly known as:  PRINIVIL,ZESTRIL Take 40 mg by mouth 2 (two) times daily.   potassium chloride SA 20 MEQ tablet Commonly known as:  K-DUR,KLOR-CON Take 40 mEq by mouth 3 (three) times daily.   simvastatin 20 MG tablet Commonly known as:  ZOCOR Take 20 mg by mouth daily.   tamsulosin 0.4 MG Caps capsule Commonly known as:  FLOMAX Take 0.4 mg by mouth daily.   Testosterone 20.25 MG/1.25GM (1.62%) Gel Apply 1 packet topically daily.   timolol 0.5 % ophthalmic solution Commonly known as:  TIMOPTIC Place 1 drop into both eyes 2 (two) times daily.            Discharge Care Instructions  Start     Ordered   01/04/17 0000  hydrocortisone (CORTEF) 10 MG tablet  Every evening      01/04/17 0954   01/04/17 0000  hydrocortisone (CORTEF) 5 MG tablet  Daily     01/04/17 0954   01/04/17 0000  clindamycin (CLEOCIN) 300 MG capsule  4 times daily     01/04/17 0954   01/04/17 0000  Increase activity slowly     01/04/17 0954   01/04/17 0000  Diet - low sodium heart healthy     01/04/17 0954   01/04/17 0000  Discharge instructions    Comments:  RESUME LASIX (Furosemide) AND POTASSIUM 01/05/17.   01/04/17 0954   01/04/17 0000  Call MD for:  temperature >100.4     01/04/17 0954   01/04/17 0000  Call MD for:  severe uncontrolled pain     01/04/17 0954   01/04/17 0000  Call MD for:  extreme fatigue     01/04/17 0954     Follow-up Information    Deterding, Jeneen Rinks, MD. Schedule an appointment as soon as possible for a visit in 1 week(s).   Specialty:  Nephrology Why:  To check kidney function/labs. Contact information: [place] [id number] [phone]            Time coordinating discharge: 35 minutes.  Signed:  RAMA,CHRISTINA  Pager [phone] [facility] 01/04/2017, 10:44 AM

## 2017-01-04 NOTE — Care Management Important Message (Signed)
Important Message  Patient Details  Name: Trevor Kelley MRN: [id number] Date of Birth: [birth year]   Medicare Important Message Given:  Yes    [name] Abena 01/04/2017, 9:04 AM

## 2017-01-05 LAB — CULTURE, BLOOD (ROUTINE X 2)
CULTURE: NO GROWTH
CULTURE: NO GROWTH
Culture: NO GROWTH
Culture: NO GROWTH
SPECIAL REQUESTS: ADEQUATE
SPECIAL REQUESTS: ADEQUATE
Special Requests: ADEQUATE
Special Requests: ADEQUATE

## 2017-02-23 DIAGNOSIS — E119 Type 2 diabetes mellitus without complications: Secondary | ICD-10-CM | POA: Diagnosis not present

## 2017-02-23 DIAGNOSIS — H2513 Age-related nuclear cataract, bilateral: Secondary | ICD-10-CM | POA: Diagnosis not present

## 2017-02-23 DIAGNOSIS — H401132 Primary open-angle glaucoma, bilateral, moderate stage: Secondary | ICD-10-CM | POA: Diagnosis not present

## 2017-02-23 DIAGNOSIS — H53431 Sector or arcuate defects, right eye: Secondary | ICD-10-CM | POA: Diagnosis not present

## 2017-02-24 DIAGNOSIS — Z09 Encounter for follow-up examination after completed treatment for conditions other than malignant neoplasm: Secondary | ICD-10-CM | POA: Diagnosis not present

## 2017-02-24 DIAGNOSIS — D329 Benign neoplasm of meninges, unspecified: Secondary | ICD-10-CM | POA: Diagnosis not present

## 2017-02-24 DIAGNOSIS — Z86018 Personal history of other benign neoplasm: Secondary | ICD-10-CM | POA: Diagnosis not present

## 2017-02-24 DIAGNOSIS — Z9889 Other specified postprocedural states: Secondary | ICD-10-CM | POA: Diagnosis not present

## 2017-02-24 DIAGNOSIS — D320 Benign neoplasm of cerebral meninges: Secondary | ICD-10-CM | POA: Diagnosis not present

## 2017-02-27 ENCOUNTER — Emergency Department (HOSPITAL_COMMUNITY)
Admission: EM | Admit: 2017-02-27 | Discharge: 2017-02-28 | Disposition: A | Discharge status: Home / Self Care | Payer: Medicare Other | Attending: Emergency Medicine | Admitting: Emergency Medicine

## 2017-02-27 ENCOUNTER — Encounter (HOSPITAL_COMMUNITY): Payer: Self-pay

## 2017-02-27 DIAGNOSIS — E1129 Type 2 diabetes mellitus with other diabetic kidney complication: Secondary | ICD-10-CM | POA: Insufficient documentation

## 2017-02-27 DIAGNOSIS — Z79899 Other long term (current) drug therapy: Secondary | ICD-10-CM | POA: Insufficient documentation

## 2017-02-27 DIAGNOSIS — N2889 Other specified disorders of kidney and ureter: Secondary | ICD-10-CM | POA: Diagnosis not present

## 2017-02-27 DIAGNOSIS — N289 Disorder of kidney and ureter, unspecified: Secondary | ICD-10-CM | POA: Insufficient documentation

## 2017-02-27 DIAGNOSIS — I10 Essential (primary) hypertension: Secondary | ICD-10-CM | POA: Diagnosis not present

## 2017-02-27 DIAGNOSIS — E876 Hypokalemia: Secondary | ICD-10-CM

## 2017-02-27 DIAGNOSIS — E039 Hypothyroidism, unspecified: Secondary | ICD-10-CM | POA: Diagnosis not present

## 2017-02-27 DIAGNOSIS — R569 Unspecified convulsions: Secondary | ICD-10-CM | POA: Diagnosis not present

## 2017-02-27 DIAGNOSIS — R9431 Abnormal electrocardiogram [ECG] [EKG]: Secondary | ICD-10-CM | POA: Diagnosis not present

## 2017-02-27 NOTE — ED Triage Notes (Signed)
Pt arrived via [facility] c/o witnessed seizure at home.  Wife witnessed pt having full body convulsions lasting 15 seconds while resting in his recliner.  No seizure history but had a brain tumor removed 3 years ago.  Pt was postictal for around 25 minutes.  Left side tongue injury, pinpoint pupils.

## 2017-02-28 ENCOUNTER — Emergency Department (HOSPITAL_COMMUNITY): Payer: Medicare Other

## 2017-02-28 DIAGNOSIS — R569 Unspecified convulsions: Secondary | ICD-10-CM | POA: Diagnosis not present

## 2017-02-28 DIAGNOSIS — E876 Hypokalemia: Secondary | ICD-10-CM | POA: Diagnosis not present

## 2017-02-28 LAB — CBC WITH DIFFERENTIAL/PLATELET
BASOS ABS: 0 10*3/uL (ref 0.0–0.1)
Basophils Relative: 0 %
EOS ABS: 0.2 10*3/uL (ref 0.0–0.7)
EOS PCT: 3 %
HCT: 33.6 % — ABNORMAL LOW (ref 39.0–52.0)
Hemoglobin: 10.8 g/dL — ABNORMAL LOW (ref 13.0–17.0)
Lymphocytes Relative: 34 %
Lymphs Abs: 2.5 10*3/uL (ref 0.7–4.0)
MCH: 24.5 pg — AB (ref 26.0–34.0)
MCHC: 32.1 g/dL (ref 30.0–36.0)
MCV: 76.2 fL — ABNORMAL LOW (ref 78.0–100.0)
Monocytes Absolute: 0.5 10*3/uL (ref 0.1–1.0)
Monocytes Relative: 7 %
Neutro Abs: 4.2 10*3/uL (ref 1.7–7.7)
Neutrophils Relative %: 56 %
PLATELETS: 159 10*3/uL (ref 150–400)
RBC: 4.41 MIL/uL (ref 4.22–5.81)
RDW: 15 % (ref 11.5–15.5)
WBC: 7.4 10*3/uL (ref 4.0–10.5)

## 2017-02-28 LAB — COMPREHENSIVE METABOLIC PANEL
ALT: 15 U/L — AB (ref 17–63)
AST: 21 U/L (ref 15–41)
Albumin: 2.9 g/dL — ABNORMAL LOW (ref 3.5–5.0)
Alkaline Phosphatase: 48 U/L (ref 38–126)
Anion gap: 7 (ref 5–15)
BILIRUBIN TOTAL: 0.5 mg/dL (ref 0.3–1.2)
BUN: 12 mg/dL (ref 6–20)
CO2: 24 mmol/L (ref 22–32)
CREATININE: 1.38 mg/dL — AB (ref 0.61–1.24)
Calcium: 7.5 mg/dL — ABNORMAL LOW (ref 8.9–10.3)
Chloride: 112 mmol/L — ABNORMAL HIGH (ref 101–111)
GFR calc Af Amer: 59 mL/min — ABNORMAL LOW (ref >60)
GFR, EST NON AFRICAN AMERICAN: 51 mL/min — AB (ref >60)
Glucose, Bld: 88 mg/dL (ref 65–99)
Potassium: 2.9 mmol/L — ABNORMAL LOW (ref 3.5–5.1)
Sodium: 143 mmol/L (ref 135–145)
TOTAL PROTEIN: 6 g/dL — AB (ref 6.5–8.1)

## 2017-02-28 LAB — RAPID URINE DRUG SCREEN, HOSP PERFORMED
Amphetamines: NOT DETECTED
BENZODIAZEPINES: NOT DETECTED
Barbiturates: NOT DETECTED
Cocaine: NOT DETECTED
Opiates: NOT DETECTED
Tetrahydrocannabinol: NOT DETECTED

## 2017-02-28 LAB — ETHANOL

## 2017-02-28 LAB — PHOSPHORUS: Phosphorus: 1.9 mg/dL — ABNORMAL LOW (ref 2.5–4.6)

## 2017-02-28 LAB — MAGNESIUM: MAGNESIUM: 1.7 mg/dL (ref 1.7–2.4)

## 2017-02-28 MED ORDER — POTASSIUM PHOSPHATES 15 MMOLE/5ML IV SOLN
10.0000 meq | Freq: Once | INTRAVENOUS | Status: DC
  Filled 2017-02-28: qty 2.27

## 2017-02-28 MED ORDER — K PHOS MONO-SOD PHOS DI & MONO 155-852-130 MG PO TABS
1000.0000 mg | ORAL_TABLET | Freq: Once | ORAL | Status: AC
  Administered 2017-02-28: 1000 mg via ORAL
  Filled 2017-02-28: qty 4

## 2017-02-28 MED ORDER — SODIUM CHLORIDE 0.9 % IV SOLN
1000.0000 mg | Freq: Once | INTRAVENOUS | Status: AC
  Administered 2017-02-28: 1000 mg via INTRAVENOUS
  Filled 2017-02-28: qty 10

## 2017-02-28 MED ORDER — POTASSIUM CHLORIDE 10 MEQ/100ML IV SOLN
10.0000 meq | Freq: Once | INTRAVENOUS | Status: AC
  Administered 2017-02-28: 10 meq via INTRAVENOUS
  Filled 2017-02-28: qty 100

## 2017-02-28 MED ORDER — POTASSIUM-SOD ACID PHOSPHATES 305-700 MG PO TABS: 1.0000 | ORAL_TABLET | Freq: Two times a day (BID) | ORAL | 0 refills | Status: DC

## 2017-02-28 MED ORDER — LEVETIRACETAM 500 MG PO TABS: 500.0000 mg | ORAL_TABLET | Freq: Two times a day (BID) | ORAL | 0 refills | Status: DC

## 2017-02-28 NOTE — ED Notes (Signed)
Patient transported to CT 

## 2017-02-28 NOTE — ED Notes (Signed)
Pt departed in NAD, escorted in wheelchair by Dorothea Ogle, [place].

## 2017-02-28 NOTE — ED Provider Notes (Signed)
[facility] EMERGENCY DEPARTMENT Provider Note   CSN: [id number] Arrival date & time: 02/27/17  2328     History   Chief Complaint Chief Complaint  Patient presents with  . Seizures    HPI Trevor Kelley is a 66 y.o. male.  The history is provided by the patient and the spouse.  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  This evening, while watching television, he was noted to cry out and then have a generalized clonic seizure.  His wife was present and estimates that seizure activity lasted about 1 minute.  This was followed by stertorous respirations and gradual return to normal mental status.  Mental status normalized over about 10 minutes.  There is no prior seizure history.  Of note, he had a routine follow-up MRI scan in the last week.  He did have a bit tongue, denies bowel or bladder incontinence.  Past Medical History:  Diagnosis Date  . CKD (chronic kidney disease)   . Diabetes mellitus without complication ([place])   . History of pituitary adenoma   . Hypertension   . Hypothyroidism   . OSA (obstructive sleep apnea)     Patient Active Problem List   Diagnosis Date Noted  . AKI (acute kidney injury) ([place]) 01/03/2017  . Thrombocytopenia ([place]) 01/01/2017  . Hypokalemia 01/01/2017  . Sepsis ([place]) 12/31/2016  . Cellulitis 12/31/2016  . Diarrhea 12/31/2016  . Anemia 12/31/2016  . OSA (obstructive sleep apnea) 12/03/2014  . Pituitary adenoma ([place]) 02/12/2014  . Diabetes mellitus type 2 in obese ([place]) 02/05/2014  . Obesity (BMI 30-39.9) 02/05/2014  . Secondary adrenal insufficiency ([place]) 02/05/2014  . 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 BRAIN SURGERY         Home Medications    Prior to Admission medications   Medication Sig Start Date End Date Taking? Authorizing Provider  amLODipine (NORVASC) 10  MG tablet Take 10 mg by mouth 2 (two) times daily.     [provider]  bimatoprost (LUMIGAN) 0.01 % SOLN Place 1 drop into both eyes at bedtime.    [provider]  calcitRIOL (ROCALTROL) 0.25 MCG capsule Take 0.25 mcg by mouth daily.    [provider]  carvedilol (COREG) 12.5 MG tablet Take 12.5 mg by mouth 2 (two) times daily with a meal.    [provider]  clindamycin (CLEOCIN) 300 MG capsule Take 1 capsule (300 mg total) by mouth 4 (four) times daily. 01/04/17   Rama, Venetia Maxon, MD  furosemide (LASIX) 80 MG tablet Take 80 mg by mouth 2 (two) times daily.    [provider]  HYDROcodone-acetaminophen (NORCO) 10-325 MG tablet Take 1-1.5 tablets by mouth See admin instructions. Take 1 and 1/2 tablets every morning and take 1 tablet every evening    [provider]  hydrocortisone (CORTEF) 10 MG tablet Take 1 tablet (10 mg total) by mouth every evening. 01/04/17   Rama, Venetia Maxon, MD  hydrocortisone (CORTEF) 5 MG tablet Take 3 tablets (15 mg total) by mouth daily. 01/04/17   Rama, Venetia Maxon, MD  levothyroxine (SYNTHROID, LEVOTHROID) 88 MCG tablet Take 88 mcg by mouth daily before breakfast.    [provider]  lisinopril (PRINIVIL,ZESTRIL) 40 MG tablet Take 40 mg by mouth 2 (two) times daily.     [provider]  potassium chloride SA (K-DUR,KLOR-CON) 20  MEQ tablet Take 40 mEq by mouth 3 (three) times daily.    [provider]  simvastatin (ZOCOR) 20 MG tablet Take 20 mg by mouth daily.    [provider]  tamsulosin (FLOMAX) 0.4 MG CAPS capsule Take 0.4 mg by mouth daily.     [provider]  Testosterone 20.25 MG/1.25GM (1.62%) GEL Apply 1 packet topically daily. 11/23/16   [provider]  timolol (TIMOPTIC) 0.5 % ophthalmic solution Place 1 drop into both eyes 2 (two) times daily. 10/26/16   [provider]    Family History Family History  Problem Relation Age of Onset  .  Hypothyroidism Unknown   . Diabetes Mellitus II Unknown   . Cancer Unknown   . Hypertension Unknown     Social History Social History  Substance Use Topics  . Smoking status: Never Smoker  . Smokeless tobacco: Never Used  . Alcohol use No     Allergies   Patient has no known allergies.   Review of Systems Review of Systems  All other systems reviewed and are negative.    Physical Exam Updated Vital Signs BP 130/88 (BP Location: Right Arm)   Pulse 64   Temp 98.4 F (36.9 C) (Oral)   Resp 15   SpO2 96%   Physical Exam  Nursing note and vitals reviewed.  66 year old male, resting comfortably and in no acute distress. Vital signs are normal. Oxygen saturation is 96%, which is normal. Head is normocephalic and atraumatic. PERRLA, EOMI. Oropharynx is clear. Neck is nontender and supple without adenopathy or JVD.  There are no carotid bruits. Back is nontender and there is no CVA tenderness. Lungs are clear without rales, wheezes, or rhonchi. Chest is nontender. Heart has regular rate and rhythm without murmur. Abdomen is soft, flat, nontender without masses or hepatosplenomegaly and peristalsis is normoactive. Extremities have no cyanosis or edema, full range of motion is present. Skin is warm and dry without rash. Neurologic: Mental status is normal, cranial nerves are intact, there are no motor or sensory deficits.  ED Treatments / Results  Labs (all labs ordered are listed, but only abnormal results are displayed) Labs Reviewed  COMPREHENSIVE METABOLIC PANEL - Abnormal; Notable for the following:       Result Value   Potassium 2.9 (*)    Chloride 112 (*)    Creatinine, Ser 1.38 (*)    Calcium 7.5 (*)    Total Protein 6.0 (*)    Albumin 2.9 (*)    ALT 15 (*)    GFR calc non Af Amer 51 (*)    GFR calc Af Amer 59 (*)    All other components within normal limits  CBC WITH DIFFERENTIAL/PLATELET - Abnormal; Notable for the following:    Hemoglobin 10.8 (*)     HCT 33.6 (*)    MCV 76.2 (*)    MCH 24.5 (*)    All other components within normal limits  PHOSPHORUS - Abnormal; Notable for the following:    Phosphorus 1.9 (*)    All other components within normal limits  ETHANOL  RAPID URINE DRUG SCREEN, HOSP PERFORMED  MAGNESIUM    EKG  EKG Interpretation  Date/Time:  Sunday February 27 2017 23:58:14 EDT Ventricular Rate:  65 PR Interval:    QRS Duration: 98 QT Interval:  440 QTC Calculation: 458 R Axis:   -3 Text Interpretation:  Sinus rhythm Prolonged PR interval Borderline T abnormalities, anterior leads No old tracing to compare  Confirmed by Delora Fuel ([id number]) on 02/28/2017 12:08:51 AM       Radiology Ct Head Wo Contrast  Result Date: 02/28/2017 CLINICAL DATA:  66 y/o M; new onset seizure tonight. History of brain tumor post craniotomy 3 years ago. EXAM: CT HEAD WITHOUT CONTRAST TECHNIQUE: Contiguous axial images were obtained from the base of the skull through the vertex without intravenous contrast. COMPARISON:  09/11/2013 MRI of the head FINDINGS: Brain: Chronic postsurgical changes related to right frontotemporal craniotomy with areas of encephalomalacia and inferolateral right frontal lobe and anterior temporal lobe as well as resection cavity in the anterior third ventricle. No acute hemorrhage, large acute infarct, or focal mass effect identified. No hydrocephalus or effacement of basilar cisterns. No midline shift. Vascular: No hyperdense vessel or unexpected calcification. Skull: Chronic postsurgical changes related to right frontal and temporal craniotomy. Fixation of the right zygomatic arch. No acute fracture. Sinuses/Orbits: Mucous retention cysts are present within the ethmoid air cells, left sphenoid sinus, and right maxillary sinus. Normal aeration of mastoid air cells. Chronic postsurgical changes in the lateral wall of right orbit. Intraorbital contents are unremarkable. Other: None. IMPRESSION: 1. No acute intracranial  abnormality. 2. Postsurgical changes with right frontotemporal craniotomy, right frontal and temporal encephalomalacia, and resection cavity within anterior third ventricle. 3. Mild paranasal sinus disease. Electronically Signed   By: Kristine Garbe M.D.   On: 02/28/2017 01:16    Procedures Procedures (including critical care time)  Medications Ordered in ED Medications  potassium PHOSPHATE 10 mEq in dextrose 5 % 250 mL infusion (not administered)  levETIRAcetam (KEPPRA) 1,000 mg in sodium chloride 0.9 % 100 mL IVPB (0 mg Intravenous Stopped 02/28/17 0132)     Initial Impression / Assessment and Plan / ED Course  I have reviewed the triage vital signs and the nursing notes.  Pertinent labs & imaging results that were available during my care of the patient were reviewed by me and considered in my medical decision making (see chart for details).  No onset seizure.  Because of prior brain surgery, I feel that initiation of anticonvulsants is warranted and he is given a loading dose of levetiracetam.  Old records are reviewed confirming resection of meningioma involving the optic chiasm in July 2015.  This was done at [facility].  MRI done last week showed normal postoperative changes and no acute process.  Will check CT of head today as well as screening labs.  Anticipate discharge with prescription for levetiracetam and follow-up with neurology.  ED workup is significant for renal insufficiency (unchanged from baseline) and hypokalemia and hypophosphatemia. He is given intravenous potassium phosphate, and discharged with prescriptions for potassium phosphate and levetiracetam. Follow up with his nephrologist in one week to monitor potassium and phosphate levels, follow up with neurology to monitor seizure disorder, consider EEG.  Final Clinical Impressions(s) / ED Diagnoses   Final diagnoses:  Seizure ([place])  Hypokalemia  Hypophosphatemia  Renal  insufficiency    New Prescriptions New Prescriptions   LEVETIRACETAM (KEPPRA) 500 MG TABLET    Take 1 tablet (500 mg total) by mouth 2 (two) times daily.   POT & SOD AC PHOSPHATES 305-700 MG TABS    Take 1 tablet by mouth 2 (two) times daily.     Delora Fuel, MD 15/40/08 [phone]

## 2017-02-28 NOTE — Discharge Instructions (Signed)
See your kidney doctor in one week to recheck your potassium and phosphorous levels. Follow up with the neurologist to get an EEG (brainwave study), and to manage  your seizures.

## 2017-03-29 DIAGNOSIS — R319 Hematuria, unspecified: Secondary | ICD-10-CM | POA: Diagnosis not present

## 2017-03-29 DIAGNOSIS — E782 Mixed hyperlipidemia: Secondary | ICD-10-CM | POA: Diagnosis not present

## 2017-03-29 DIAGNOSIS — I129 Hypertensive chronic kidney disease with stage 1 through stage 4 chronic kidney disease, or unspecified chronic kidney disease: Secondary | ICD-10-CM | POA: Diagnosis not present

## 2017-03-29 DIAGNOSIS — D352 Benign neoplasm of pituitary gland: Secondary | ICD-10-CM | POA: Diagnosis not present

## 2017-03-29 DIAGNOSIS — E663 Overweight: Secondary | ICD-10-CM | POA: Diagnosis not present

## 2017-03-29 DIAGNOSIS — D631 Anemia in chronic kidney disease: Secondary | ICD-10-CM | POA: Diagnosis not present

## 2017-03-29 DIAGNOSIS — E876 Hypokalemia: Secondary | ICD-10-CM | POA: Diagnosis not present

## 2017-03-29 DIAGNOSIS — R809 Proteinuria, unspecified: Secondary | ICD-10-CM | POA: Diagnosis not present

## 2017-03-29 DIAGNOSIS — N183 Chronic kidney disease, stage 3 (moderate): Secondary | ICD-10-CM | POA: Diagnosis not present

## 2017-03-29 DIAGNOSIS — N2581 Secondary hyperparathyroidism of renal origin: Secondary | ICD-10-CM | POA: Diagnosis not present

## 2017-03-29 DIAGNOSIS — R569 Unspecified convulsions: Secondary | ICD-10-CM | POA: Diagnosis not present

## 2017-04-08 ENCOUNTER — Other Ambulatory Visit (HOSPITAL_COMMUNITY): Payer: Self-pay

## 2017-04-08 DIAGNOSIS — I129 Hypertensive chronic kidney disease with stage 1 through stage 4 chronic kidney disease, or unspecified chronic kidney disease: Secondary | ICD-10-CM | POA: Diagnosis not present

## 2017-04-08 DIAGNOSIS — D631 Anemia in chronic kidney disease: Secondary | ICD-10-CM | POA: Diagnosis not present

## 2017-04-08 NOTE — Discharge Instructions (Signed)

## 2017-04-11 ENCOUNTER — Encounter (HOSPITAL_COMMUNITY): Payer: Self-pay

## 2017-04-11 ENCOUNTER — Ambulatory Visit (HOSPITAL_COMMUNITY)
Admission: RE | Admit: 2017-04-11 | Discharge: 2017-04-11 | Disposition: A | Discharge status: Home / Self Care | Payer: Medicare Other | Source: Ambulatory Visit | Attending: Nephrology | Admitting: Nephrology

## 2017-04-11 DIAGNOSIS — D631 Anemia in chronic kidney disease: Secondary | ICD-10-CM | POA: Insufficient documentation

## 2017-04-11 MED ORDER — SODIUM CHLORIDE 0.9 % IV SOLN
510.0000 mg | INTRAVENOUS | Status: DC
  Administered 2017-04-11: 510 mg via INTRAVENOUS
  Filled 2017-04-11: qty 17

## 2017-04-15 DIAGNOSIS — I129 Hypertensive chronic kidney disease with stage 1 through stage 4 chronic kidney disease, or unspecified chronic kidney disease: Secondary | ICD-10-CM | POA: Diagnosis not present

## 2017-04-15 DIAGNOSIS — N183 Chronic kidney disease, stage 3 (moderate): Secondary | ICD-10-CM | POA: Diagnosis not present

## 2017-04-18 ENCOUNTER — Encounter (HOSPITAL_COMMUNITY): Payer: 59

## 2017-04-22 ENCOUNTER — Ambulatory Visit (HOSPITAL_COMMUNITY)
Admission: RE | Admit: 2017-04-22 | Discharge: 2017-04-22 | Disposition: A | Discharge status: Home / Self Care | Payer: Medicare Other | Source: Ambulatory Visit | Attending: Nephrology | Admitting: Nephrology

## 2017-04-22 DIAGNOSIS — D631 Anemia in chronic kidney disease: Secondary | ICD-10-CM | POA: Insufficient documentation

## 2017-04-22 MED ORDER — SODIUM CHLORIDE 0.9 % IV SOLN
510.0000 mg | INTRAVENOUS | Status: DC
  Administered 2017-04-22: 510 mg via INTRAVENOUS
  Filled 2017-04-22: qty 17

## 2017-05-31 ENCOUNTER — Encounter: Payer: Self-pay | Admitting: Neurology

## 2017-05-31 ENCOUNTER — Ambulatory Visit (INDEPENDENT_AMBULATORY_CARE_PROVIDER_SITE_OTHER): Payer: Medicare Other | Admitting: Neurology

## 2017-05-31 DIAGNOSIS — E2749 Other adrenocortical insufficiency: Secondary | ICD-10-CM | POA: Diagnosis not present

## 2017-05-31 DIAGNOSIS — E230 Hypopituitarism: Secondary | ICD-10-CM | POA: Diagnosis not present

## 2017-05-31 DIAGNOSIS — E038 Other specified hypothyroidism: Secondary | ICD-10-CM | POA: Diagnosis not present

## 2017-05-31 DIAGNOSIS — E785 Hyperlipidemia, unspecified: Secondary | ICD-10-CM | POA: Diagnosis not present

## 2017-05-31 DIAGNOSIS — N183 Chronic kidney disease, stage 3 (moderate): Secondary | ICD-10-CM | POA: Diagnosis not present

## 2017-05-31 DIAGNOSIS — G40909 Epilepsy, unspecified, not intractable, without status epilepticus: Secondary | ICD-10-CM | POA: Diagnosis not present

## 2017-05-31 DIAGNOSIS — E221 Hyperprolactinemia: Secondary | ICD-10-CM | POA: Diagnosis not present

## 2017-05-31 DIAGNOSIS — R7303 Prediabetes: Secondary | ICD-10-CM | POA: Diagnosis not present

## 2017-05-31 DIAGNOSIS — R569 Unspecified convulsions: Secondary | ICD-10-CM | POA: Diagnosis not present

## 2017-05-31 DIAGNOSIS — D352 Benign neoplasm of pituitary gland: Secondary | ICD-10-CM | POA: Diagnosis not present

## 2017-05-31 DIAGNOSIS — N2581 Secondary hyperparathyroidism of renal origin: Secondary | ICD-10-CM | POA: Diagnosis not present

## 2017-05-31 DIAGNOSIS — Z9889 Other specified postprocedural states: Secondary | ICD-10-CM | POA: Diagnosis not present

## 2017-05-31 HISTORY — DX: Epilepsy, unspecified, not intractable, without status epilepticus: G40.909

## 2017-05-31 MED ORDER — LEVETIRACETAM 500 MG PO TABS: 500.0000 mg | ORAL_TABLET | Freq: Two times a day (BID) | ORAL | 3 refills | Status: DC

## 2017-05-31 NOTE — Progress Notes (Signed)
Faxed printed/signed rx levetiracetam to [facility] at [phone]. Received fax confirmation.

## 2017-05-31 NOTE — Progress Notes (Signed)
Reason for visit: Seizures  Referring physician: [place]  Trevor Kelley is a 67 y.o. male  History of present illness:  [name] is a 67 year old right-handed black male with a history of a frontal meningioma that was resected in 2015.  The patient had presented with visual disturbances, MRI of the brain showed a large meningioma compressing the optic chiasm.  The patient indicates that his vision has recovered since the surgery.  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  The patient presented with a seizure event while at home on 28 February 2017.  The patient was with his wife at the time, he yelled out and then was noted to have generalized jerking, this was also associated with tongue biting but not with bowel or bladder incontinence.  The patient went to the emergency room, he was placed on Keppra and the patient took the medication for 1 month and then ran out of the prescription.  The patient has been off [place] since late November 2018.  The patient continues to operate a motor vehicle.  He has not had any recurring seizures.  He denies headache, visual changes, numbness or weakness of the face, arms, or legs.  The patient denies any balance issues or difficulty controlling the bowels or the bladder.  He comes to this office for further evaluation.  Past Medical History:  Diagnosis Date  . CKD (chronic kidney disease)   . Diabetes mellitus without complication ([place])   . History of pituitary adenoma   . Hypertension   . Hypothyroidism   . OSA (obstructive sleep apnea)   . Seizure disorder ([place]) 05/31/2017    Past Surgical History:  Procedure Laterality Date  . BRAIN SURGERY      Family History  Problem Relation Age of Onset  . Hypothyroidism Unknown   . Diabetes Mellitus II Unknown   . Cancer Unknown   . Hypertension Unknown     Social history:  reports that  has never smoked. he has never  used smokeless tobacco. He reports that he does not drink alcohol or use drugs.  Medications:  Prior to Admission medications   Medication Sig Start Date End Date Taking? Authorizing Provider  amLODipine (NORVASC) 10 MG tablet Take 10 mg by mouth 2 (two) times daily.    Yes [provider]  calcitRIOL (ROCALTROL) 0.25 MCG capsule Take 0.25 mcg by mouth daily.   Yes [provider]  carvedilol (COREG) 12.5 MG tablet Take 12.5 mg by mouth 2 (two) times daily with a meal.   Yes [provider]  furosemide (LASIX) 80 MG tablet Take 80 mg by mouth 2 (two) times daily.   Yes [provider]  hydrocortisone (CORTEF) 10 MG tablet Take 10-15 mg by mouth See admin instructions. Take 1 and 1/2 tablets every morning and take 1 tablet daily 02/25/17  Yes [provider]  levothyroxine (SYNTHROID, LEVOTHROID) 88 MCG tablet Take 88 mcg by mouth daily before breakfast.   Yes [provider]  lisinopril (PRINIVIL,ZESTRIL) 40 MG tablet Take 40 mg by mouth 2 (two) times daily.    Yes [provider]  potassium chloride SA (K-DUR,KLOR-CON) 20 MEQ tablet Take 40 mEq by mouth 3 (three) times daily.   Yes [provider]  simvastatin (ZOCOR) 20 MG tablet Take 20 mg by mouth daily.   Yes [provider]  spironolactone (ALDACTONE) 25 MG tablet Take 25 mg by mouth  2 (two) times daily.   Yes [provider]  tamsulosin (FLOMAX) 0.4 MG CAPS capsule Take 0.4 mg by mouth daily.    Yes [provider]  Testosterone 20.25 MG/1.25GM (1.62%) GEL Apply 1 packet topically daily. 11/23/16  Yes [provider]  timolol (TIMOPTIC) 0.5 % ophthalmic solution Place 1 drop into both eyes 2 (two) times daily. 10/26/16  Yes [provider]  levETIRAcetam (KEPPRA) 500 MG tablet Take 1 tablet (500 mg total) by mouth 2 (two) times daily. Patient not taking: Reported on 3/81/8299 37/16/96   Delora Fuel, MD     No Known  Allergies  ROS:  Out of a complete 14 system review of symptoms, the patient complains only of the following symptoms, and all other reviewed systems are negative.  Swelling in the legs Impotence Dizziness  Blood pressure 118/79, pulse 63, height 6' (1.829 m), weight 242 lb (109.8 kg).  Physical Exam  General: The patient is alert and cooperative at the time of the examination.  Eyes: Pupils are equal, round, and reactive to light. Discs are flat bilaterally.  Neck: The neck is supple, no carotid bruits are noted.  Respiratory: The respiratory examination is clear.  Cardiovascular: The cardiovascular examination reveals a regular rate and rhythm, no obvious murmurs or rubs are noted.  Skin: Extremities are with 1+ edema below the knees bilaterally.  Neurologic Exam  Mental status: The patient is alert and oriented x 3 at the time of the examination. The patient has apparent normal recent and remote memory, with an apparently normal attention span and concentration ability.  Cranial nerves: Facial symmetry is present. There is good sensation of the face to pinprick and soft touch bilaterally. The strength of the facial muscles and the muscles to head turning and shoulder shrug are normal bilaterally. Speech is well enunciated, no aphasia or dysarthria is noted. Extraocular movements are full. Visual fields are full. The tongue is midline, and the patient has symmetric elevation of the soft palate. No obvious hearing deficits are noted.  Motor: The motor testing reveals 5 over 5 strength of all 4 extremities. Good symmetric motor tone is noted throughout.  Sensory: Sensory testing is intact to pinprick, soft touch, vibration sensation, and position sense on all 4 extremities. No evidence of extinction is noted.  Coordination: Cerebellar testing reveals good finger-nose-finger and heel-to-shin bilaterally.  Gait and station: Gait is normal. Tandem gait is normal. Romberg is  negative. No drift is seen.  Reflexes: Deep tendon reflexes are symmetric and normal bilaterally. Toes are downgoing bilaterally.   CT head 02/28/17:  IMPRESSION: 1. No acute intracranial abnormality. 2. Postsurgical changes with right frontotemporal craniotomy, right frontal and temporal encephalomalacia, and resection cavity within anterior third ventricle. 3. Mild paranasal sinus disease.  * CT scan images were reviewed online. I agree with the written report.    Assessment/Plan:  1.  History of meningioma status post resection  2.  Seizure disorder  The patient likely has seizures associated with the encephalomalacia affecting the right frontal and temporal lobes.  The patient will undergo an EEG study.  He will get back on the Keppra taking 500 mg twice daily.  He indicated that he tolerated the medication well.  He is not to operate a motor vehicle for 6 months following the seizure on 28 February 2017.  The patient will follow-up in 6 months.  He will contact our office if a recurring seizure is noted.  Jill Alexanders MD 05/31/2017 9:29 AM  [facility] [address] [place], [place] [address]  Phone [phone] Fax [phone]

## 2017-05-31 NOTE — Patient Instructions (Signed)
   We will check an EEG study and restart the Keppra 500 mg twice a day.

## 2017-06-07 ENCOUNTER — Ambulatory Visit (INDEPENDENT_AMBULATORY_CARE_PROVIDER_SITE_OTHER): Payer: Medicare Other | Admitting: Neurology

## 2017-06-07 ENCOUNTER — Telehealth: Payer: Self-pay | Admitting: Neurology

## 2017-06-07 DIAGNOSIS — G40909 Epilepsy, unspecified, not intractable, without status epilepticus: Secondary | ICD-10-CM

## 2017-06-07 NOTE — Telephone Encounter (Signed)
I called the patient.  The EEG study does show some right frontotemporal slowing consistent with the site of surgery, no epileptiform discharges otherwise.  The patient will remain on Keppra.

## 2017-06-07 NOTE — Procedures (Signed)
     History: Trevor Kelley is a 67 year old gentleman with a history of a frontal meningioma resected with residual right frontotemporal encephalomalacia.  The patient had the original surgery in 2015, he suffered a seizure event in October 2018.  The patient is being evaluated for this episode.  This is a routine EEG.  No skull defects are noted.  Medications include Norvasc, Calcitrol, Coreg, Lasix, hydrocortisone, Synthroid, lisinopril, potassium supplementation, Zocor, spironolactone, Flomax, and Keppra.  EEG classification: Dysrhythmia grade 1 right frontotemporal  Description of the recording: The background rhythms of this recording consists of a well modulated medium amplitude alpha rhythm of 8 Hz that is reactive to eye-opening closure.  As the record progresses, photic stimulation is performed, this results in a bilateral and symmetric photic driving response.  Hyperventilation was not performed.  During the recording, there appears to be a persistent slightly higher amplitude 7 Hz theta activity emanating from the right frontotemporal region that is seen throughout the recording.  Toward the end of the recording, there appears to be more generalized slowing seen consistent with drowsiness, the patient never enter stage II sleep.  At no time during the study does there appear to be evidence of spike or spike-wave discharges.  EKG monitor shows no evidence of cardiac rhythm abnormalities with a heart rate of 66.  Impression: This is an abnormal EEG recording secondary to right frontotemporal theta slowing.  This study is consistent with focal injury to the right frontotemporal area consistent with the history of encephalomalacia in this region.  At no time during the recording does there appear to be evidence of epileptiform discharges.

## 2017-06-10 DIAGNOSIS — I129 Hypertensive chronic kidney disease with stage 1 through stage 4 chronic kidney disease, or unspecified chronic kidney disease: Secondary | ICD-10-CM | POA: Diagnosis not present

## 2017-06-21 DIAGNOSIS — I129 Hypertensive chronic kidney disease with stage 1 through stage 4 chronic kidney disease, or unspecified chronic kidney disease: Secondary | ICD-10-CM | POA: Diagnosis not present

## 2017-07-06 DIAGNOSIS — H401132 Primary open-angle glaucoma, bilateral, moderate stage: Secondary | ICD-10-CM | POA: Diagnosis not present

## 2017-07-06 DIAGNOSIS — H2513 Age-related nuclear cataract, bilateral: Secondary | ICD-10-CM | POA: Diagnosis not present

## 2017-07-06 DIAGNOSIS — N183 Chronic kidney disease, stage 3 (moderate): Secondary | ICD-10-CM | POA: Diagnosis not present

## 2017-07-06 DIAGNOSIS — H53413 Scotoma involving central area, bilateral: Secondary | ICD-10-CM | POA: Diagnosis not present

## 2017-08-01 DIAGNOSIS — E663 Overweight: Secondary | ICD-10-CM | POA: Diagnosis not present

## 2017-08-01 DIAGNOSIS — I129 Hypertensive chronic kidney disease with stage 1 through stage 4 chronic kidney disease, or unspecified chronic kidney disease: Secondary | ICD-10-CM | POA: Diagnosis not present

## 2017-08-01 DIAGNOSIS — N2581 Secondary hyperparathyroidism of renal origin: Secondary | ICD-10-CM | POA: Diagnosis not present

## 2017-08-01 DIAGNOSIS — N529 Male erectile dysfunction, unspecified: Secondary | ICD-10-CM | POA: Diagnosis not present

## 2017-08-01 DIAGNOSIS — E782 Mixed hyperlipidemia: Secondary | ICD-10-CM | POA: Diagnosis not present

## 2017-08-01 DIAGNOSIS — D352 Benign neoplasm of pituitary gland: Secondary | ICD-10-CM | POA: Diagnosis not present

## 2017-08-01 DIAGNOSIS — R319 Hematuria, unspecified: Secondary | ICD-10-CM | POA: Diagnosis not present

## 2017-08-01 DIAGNOSIS — R569 Unspecified convulsions: Secondary | ICD-10-CM | POA: Diagnosis not present

## 2017-08-01 DIAGNOSIS — N183 Chronic kidney disease, stage 3 (moderate): Secondary | ICD-10-CM | POA: Diagnosis not present

## 2017-08-01 DIAGNOSIS — D631 Anemia in chronic kidney disease: Secondary | ICD-10-CM | POA: Diagnosis not present

## 2017-08-01 DIAGNOSIS — Z0000 Encounter for general adult medical examination without abnormal findings: Secondary | ICD-10-CM | POA: Diagnosis not present

## 2017-08-01 DIAGNOSIS — R809 Proteinuria, unspecified: Secondary | ICD-10-CM | POA: Diagnosis not present

## 2017-11-04 DIAGNOSIS — H401132 Primary open-angle glaucoma, bilateral, moderate stage: Secondary | ICD-10-CM | POA: Diagnosis not present

## 2017-11-30 ENCOUNTER — Ambulatory Visit (INDEPENDENT_AMBULATORY_CARE_PROVIDER_SITE_OTHER): Payer: Medicare Other | Admitting: Adult Health

## 2017-11-30 ENCOUNTER — Encounter: Payer: Self-pay | Admitting: Adult Health

## 2017-11-30 VITALS — BP 122/79 | HR 56 | Ht 72.0 in | Wt 240.8 lb

## 2017-11-30 DIAGNOSIS — G40909 Epilepsy, unspecified, not intractable, without status epilepticus: Secondary | ICD-10-CM | POA: Diagnosis not present

## 2017-11-30 NOTE — Patient Instructions (Signed)
Your Plan:  Continue Keppra 500 mg twice a day If he has any seizure events please let us know If your symptoms worsen or you develop new symptoms please let us know.    Thank you for coming to see [place] at [facility]. I hope we have been able to provide you high quality care today.  You may receive a patient satisfaction survey over the next few weeks. We would appreciate your feedback and comments so that we may continue to improve ourselves and the health of our patients.

## 2017-11-30 NOTE — Progress Notes (Signed)
PATIENT: Trevor Kelley DOB: 02/21/1951  REASON FOR VISIT: follow up HISTORY FROM: patient  HISTORY OF PRESENT ILLNESS: Today 11/30/17:  Trevor Kelley is a 67 year old male with a history of seizures.  He returns today for follow-up.  At the last visit he was restarted on Keppra 500 mg twice a day.  He denies any seizure events.  He is able to complete all ADLs independently.   the patient is back to operating a motor vehicle.  His last seizure was in October.  Denies any changes in his mood or behavior.  He returns today for evaluation.  HISTORY Trevor Kelley is a 67 year old right-handed black male with a history of a frontal meningioma that was resected in 2015.  The patient had presented with visual disturbances, MRI of the brain showed a large meningioma compressing the optic chiasm.  The patient indicates that his vision has recovered since the surgery.  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  The patient presented with a seizure event while at home on 28 February 2017.  The patient was with his wife at the time, he yelled out and then was noted to have generalized jerking, this was also associated with tongue biting but not with bowel or bladder incontinence.  The patient went to the emergency room, he was placed on Keppra and the patient took the medication for 1 month and then ran out of the prescription.  The patient has been off [place] since late November 2018.  The patient continues to operate a motor vehicle.  He has not had any recurring seizures.  He denies headache, visual changes, numbness or weakness of the face, arms, or legs.  The patient denies any balance issues or difficulty controlling the bowels or the bladder.  He comes to this office for further evaluation.    REVIEW OF SYSTEMS: Out of a complete 14 system review of symptoms, the patient complains only of the following symptoms, and all  other reviewed systems are negative.  See HPI  ALLERGIES: No Known Allergies  HOME MEDICATIONS: Outpatient Medications Prior to Visit  Medication Sig Dispense Refill  . amLODipine (NORVASC) 10 MG tablet Take 10 mg by mouth 2 (two) times daily.     . calcitRIOL (ROCALTROL) 0.25 MCG capsule Take 0.25 mcg by mouth daily.    . carvedilol (COREG) 12.5 MG tablet Take 12.5 mg by mouth 2 (two) times daily with a meal.    . furosemide (LASIX) 80 MG tablet Take 80 mg by mouth 2 (two) times daily.    . hydrocortisone (CORTEF) 10 MG tablet Take 10-15 mg by mouth See admin instructions. Take 1 and 1/2 tablets every morning and take 1 tablet daily    . levETIRAcetam (KEPPRA) 500 MG tablet Take 1 tablet (500 mg total) by mouth 2 (two) times daily. 180 tablet 3  . levothyroxine (SYNTHROID, LEVOTHROID) 88 MCG tablet Take 88 mcg by mouth daily before breakfast.    . lisinopril (PRINIVIL,ZESTRIL) 40 MG tablet Take 40 mg by mouth 2 (two) times daily.     Marland Kitchen LUMIGAN 0.01 % SOLN INSTILL 1 DROP(S) IN EACH EYE BY OPHTHALMIC ROUTE AT BEDTIME  3  . potassium chloride SA (K-DUR,KLOR-CON) 20 MEQ tablet Take 40 mEq by mouth 3 (three) times daily.    . simvastatin (ZOCOR) 20 MG tablet Take 20 mg by mouth daily.    Marland Kitchen spironolactone (ALDACTONE) 25 MG tablet Take 25 mg by  mouth 2 (two) times daily.    . tamsulosin (FLOMAX) 0.4 MG CAPS capsule Take 0.4 mg by mouth daily.     . timolol (TIMOPTIC) 0.5 % ophthalmic solution Place 1 drop into both eyes 2 (two) times daily.    . Testosterone 20.25 MG/1.25GM (1.62%) GEL Apply 1 packet topically daily.     No facility-administered medications prior to visit.     PAST MEDICAL HISTORY: Past Medical History:  Diagnosis Date  . CKD (chronic kidney disease)   . Diabetes mellitus without complication ([place])   . History of pituitary adenoma   . Hypertension   . Hypothyroidism   . OSA (obstructive sleep apnea)   . Seizure disorder ([place]) 05/31/2017    PAST SURGICAL  HISTORY: Past Surgical History:  Procedure Laterality Date  . BRAIN SURGERY      FAMILY HISTORY: Family History  Problem Relation Age of Onset  . Hypothyroidism Unknown   . Diabetes Mellitus II Unknown   . Cancer Unknown   . Hypertension Unknown     SOCIAL HISTORY: Social History   Socioeconomic History  . Marital status: Married    Spouse name: Neoma Laming  . Number of children: 2  . Years of education: 42  . Highest education level: Not on file  Occupational History  . Occupation: Retired  Scientific laboratory technician  . Financial resource strain: Not on file  . Food insecurity:    Worry: Not on file    Inability: Not on file  . Transportation needs:    Medical: Not on file    Non-medical: Not on file  Tobacco Use  . Smoking status: Never Smoker  . Smokeless tobacco: Never Used  Substance and Sexual Activity  . Alcohol use: No  . Drug use: No  . Sexual activity: Never  Lifestyle  . Physical activity:    Days per week: Not on file    Minutes per session: Not on file  . Stress: Not on file  Relationships  . Social connections:    Talks on phone: Not on file    Gets together: Not on file    Attends religious service: Not on file    Active member of club or organization: Not on file    Attends meetings of clubs or organizations: Not on file    Relationship status: Not on file  . Intimate partner violence:    Fear of current or ex partner: Not on file    Emotionally abused: Not on file    Physically abused: Not on file    Forced sexual activity: Not on file  Other Topics Concern  . Not on file  Social History Narrative   Lives with wife   Married, 2 children   Right handed   12 th grade   1 cup daily      PHYSICAL EXAM  Vitals:   11/30/17 0805  BP: 122/79  Pulse: (!) 56  Weight: 240 lb 12.8 oz (109.2 kg)  Height: 6' (1.829 m)   Body mass index is 32.66 kg/m.  Generalized: Well developed, in no acute distress   Neurological examination  Mentation: Alert  oriented to time, place, history taking. Follows all commands speech and language fluent Cranial nerve II-XII: Pupils were equal round reactive to light. Extraocular movements were full, visual field were full on confrontational test. Facial sensation and strength were normal. Uvula tongue midline. Head turning and shoulder shrug  were normal and symmetric. Motor: The motor testing reveals 5 over 5 strength  of all 4 extremities. Good symmetric motor tone is noted throughout.  Sensory: Sensory testing is intact to soft touch on all 4 extremities. No evidence of extinction is noted.  Coordination: Cerebellar testing reveals good finger-nose-finger and heel-to-shin bilaterally.  Gait and station: Gait is normal. Tandem gait is normal. Romberg is negative. No drift is seen.  Reflexes: Deep tendon reflexes are symmetric and normal bilaterally.   DIAGNOSTIC DATA (LABS, IMAGING, TESTING) - I reviewed patient records, labs, notes, testing and imaging myself where available.  Lab Results  Component Value Date   WBC 7.4 02/28/2017   HGB 10.8 (L) 02/28/2017   HCT 33.6 (L) 02/28/2017   MCV 76.2 (L) 02/28/2017   PLT 159 02/28/2017      Component Value Date/Time   NA 143 02/28/2017 0034   K 2.9 (L) 02/28/2017 0034   CL 112 (H) 02/28/2017 0034   CO2 24 02/28/2017 0034   GLUCOSE 88 02/28/2017 0034   BUN 12 02/28/2017 0034   CREATININE 1.38 (H) 02/28/2017 0034   CALCIUM 7.5 (L) 02/28/2017 0034   PROT 6.0 (L) 02/28/2017 0034   ALBUMIN 2.9 (L) 02/28/2017 0034   AST 21 02/28/2017 0034   ALT 15 (L) 02/28/2017 0034   ALKPHOS 48 02/28/2017 0034   BILITOT 0.5 02/28/2017 0034   GFRNONAA 51 (L) 02/28/2017 0034   GFRAA 59 (L) 02/28/2017 0034   Lab Results  Component Value Date   CHOL 122 09/03/2013   HDL 30 (L) 09/03/2013   LDLCALC 71 09/03/2013   TRIG 103 09/03/2013   Lab Results  Component Value Date   HGBA1C 5.9 (H) 12/31/2016   Lab Results  Component Value Date   VITAMINB12 302 12/31/2016    No results found for: TSH    ASSESSMENT AND PLAN 67 y.o. year old male  has a past medical history of CKD (chronic kidney disease), Diabetes mellitus without complication ([place]), History of pituitary adenoma, Hypertension, Hypothyroidism, OSA (obstructive sleep apnea), and Seizure disorder ([place]) (05/31/2017). here with:  1.  Seizures  Overall the patient is doing well.  He will continue on Keppra 500 mg twice a day.  He is advised that if he has any seizure events he should let me know.  He will follow-up in 6 months or sooner if needed.   I spent 15 minutes with the patient. 50% of this time was spent discussing his medication.   Ward Givens, MSN, NP-C 11/30/2017, 8:06 AM [facility] [address] [place], [place] [address] [phone]

## 2017-11-30 NOTE — Progress Notes (Signed)
I have read the note, and I agree with the clinical assessment and plan.  Chaquita Basques K Brentlee Sciara   

## 2018-01-02 DIAGNOSIS — Z0000 Encounter for general adult medical examination without abnormal findings: Secondary | ICD-10-CM | POA: Diagnosis not present

## 2018-01-02 DIAGNOSIS — I129 Hypertensive chronic kidney disease with stage 1 through stage 4 chronic kidney disease, or unspecified chronic kidney disease: Secondary | ICD-10-CM | POA: Diagnosis not present

## 2018-01-02 DIAGNOSIS — N2581 Secondary hyperparathyroidism of renal origin: Secondary | ICD-10-CM | POA: Diagnosis not present

## 2018-01-02 DIAGNOSIS — R569 Unspecified convulsions: Secondary | ICD-10-CM | POA: Diagnosis not present

## 2018-01-02 DIAGNOSIS — N183 Chronic kidney disease, stage 3 (moderate): Secondary | ICD-10-CM | POA: Diagnosis not present

## 2018-01-02 DIAGNOSIS — D631 Anemia in chronic kidney disease: Secondary | ICD-10-CM | POA: Diagnosis not present

## 2018-01-02 DIAGNOSIS — D352 Benign neoplasm of pituitary gland: Secondary | ICD-10-CM | POA: Diagnosis not present

## 2018-01-02 DIAGNOSIS — N529 Male erectile dysfunction, unspecified: Secondary | ICD-10-CM | POA: Diagnosis not present

## 2018-01-02 DIAGNOSIS — R319 Hematuria, unspecified: Secondary | ICD-10-CM | POA: Diagnosis not present

## 2018-01-02 DIAGNOSIS — R809 Proteinuria, unspecified: Secondary | ICD-10-CM | POA: Diagnosis not present

## 2018-01-02 DIAGNOSIS — E782 Mixed hyperlipidemia: Secondary | ICD-10-CM | POA: Diagnosis not present

## 2018-01-02 DIAGNOSIS — E663 Overweight: Secondary | ICD-10-CM | POA: Diagnosis not present

## 2018-01-20 DIAGNOSIS — E291 Testicular hypofunction: Secondary | ICD-10-CM | POA: Diagnosis not present

## 2018-01-20 DIAGNOSIS — N401 Enlarged prostate with lower urinary tract symptoms: Secondary | ICD-10-CM | POA: Diagnosis not present

## 2018-01-25 DIAGNOSIS — R351 Nocturia: Secondary | ICD-10-CM | POA: Diagnosis not present

## 2018-01-25 DIAGNOSIS — N401 Enlarged prostate with lower urinary tract symptoms: Secondary | ICD-10-CM | POA: Diagnosis not present

## 2018-01-25 DIAGNOSIS — E291 Testicular hypofunction: Secondary | ICD-10-CM | POA: Diagnosis not present

## 2018-01-25 DIAGNOSIS — N5201 Erectile dysfunction due to arterial insufficiency: Secondary | ICD-10-CM | POA: Diagnosis not present

## 2018-02-22 DIAGNOSIS — H401132 Primary open-angle glaucoma, bilateral, moderate stage: Secondary | ICD-10-CM | POA: Diagnosis not present

## 2018-02-22 DIAGNOSIS — H53431 Sector or arcuate defects, right eye: Secondary | ICD-10-CM | POA: Diagnosis not present

## 2018-02-22 DIAGNOSIS — E119 Type 2 diabetes mellitus without complications: Secondary | ICD-10-CM | POA: Diagnosis not present

## 2018-02-22 DIAGNOSIS — H2513 Age-related nuclear cataract, bilateral: Secondary | ICD-10-CM | POA: Diagnosis not present

## 2018-02-23 ENCOUNTER — Encounter (HOSPITAL_COMMUNITY): Payer: Self-pay

## 2018-02-23 ENCOUNTER — Inpatient Hospital Stay (HOSPITAL_COMMUNITY): Payer: Medicare Other

## 2018-02-23 ENCOUNTER — Emergency Department (HOSPITAL_COMMUNITY): Payer: Medicare Other

## 2018-02-23 ENCOUNTER — Inpatient Hospital Stay (HOSPITAL_COMMUNITY)
Admission: EM | Admit: 2018-02-23 | Discharge: 2018-02-26 | DRG: 871 | Disposition: A | Discharge status: Home / Self Care | Payer: Medicare Other | Attending: Internal Medicine | Admitting: Internal Medicine

## 2018-02-23 DIAGNOSIS — Z79890 Hormone replacement therapy (postmenopausal): Secondary | ICD-10-CM

## 2018-02-23 DIAGNOSIS — A419 Sepsis, unspecified organism: Principal | ICD-10-CM

## 2018-02-23 DIAGNOSIS — M19011 Primary osteoarthritis, right shoulder: Secondary | ICD-10-CM | POA: Diagnosis present

## 2018-02-23 DIAGNOSIS — E039 Hypothyroidism, unspecified: Secondary | ICD-10-CM | POA: Diagnosis present

## 2018-02-23 DIAGNOSIS — R6521 Severe sepsis with septic shock: Secondary | ICD-10-CM | POA: Diagnosis present

## 2018-02-23 DIAGNOSIS — E1122 Type 2 diabetes mellitus with diabetic chronic kidney disease: Secondary | ICD-10-CM | POA: Diagnosis present

## 2018-02-23 DIAGNOSIS — D352 Benign neoplasm of pituitary gland: Secondary | ICD-10-CM | POA: Diagnosis present

## 2018-02-23 DIAGNOSIS — E785 Hyperlipidemia, unspecified: Secondary | ICD-10-CM | POA: Diagnosis present

## 2018-02-23 DIAGNOSIS — D899 Disorder involving the immune mechanism, unspecified: Secondary | ICD-10-CM | POA: Diagnosis present

## 2018-02-23 DIAGNOSIS — N179 Acute kidney failure, unspecified: Secondary | ICD-10-CM | POA: Diagnosis not present

## 2018-02-23 DIAGNOSIS — N183 Chronic kidney disease, stage 3 (moderate): Secondary | ICD-10-CM | POA: Diagnosis present

## 2018-02-23 DIAGNOSIS — G40909 Epilepsy, unspecified, not intractable, without status epilepticus: Secondary | ICD-10-CM | POA: Diagnosis present

## 2018-02-23 DIAGNOSIS — Z7952 Long term (current) use of systemic steroids: Secondary | ICD-10-CM | POA: Diagnosis not present

## 2018-02-23 DIAGNOSIS — E2749 Other adrenocortical insufficiency: Secondary | ICD-10-CM | POA: Diagnosis present

## 2018-02-23 DIAGNOSIS — Z6831 Body mass index (BMI) 31.0-31.9, adult: Secondary | ICD-10-CM

## 2018-02-23 DIAGNOSIS — E669 Obesity, unspecified: Secondary | ICD-10-CM | POA: Diagnosis not present

## 2018-02-23 DIAGNOSIS — L03115 Cellulitis of right lower limb: Secondary | ICD-10-CM | POA: Diagnosis present

## 2018-02-23 DIAGNOSIS — E1169 Type 2 diabetes mellitus with other specified complication: Secondary | ICD-10-CM

## 2018-02-23 DIAGNOSIS — G4733 Obstructive sleep apnea (adult) (pediatric): Secondary | ICD-10-CM | POA: Diagnosis not present

## 2018-02-23 DIAGNOSIS — N281 Cyst of kidney, acquired: Secondary | ICD-10-CM | POA: Diagnosis not present

## 2018-02-23 DIAGNOSIS — Z23 Encounter for immunization: Secondary | ICD-10-CM | POA: Diagnosis not present

## 2018-02-23 DIAGNOSIS — E230 Hypopituitarism: Secondary | ICD-10-CM | POA: Diagnosis present

## 2018-02-23 DIAGNOSIS — E038 Other specified hypothyroidism: Secondary | ICD-10-CM | POA: Diagnosis present

## 2018-02-23 DIAGNOSIS — R6520 Severe sepsis without septic shock: Secondary | ICD-10-CM | POA: Diagnosis not present

## 2018-02-23 DIAGNOSIS — I10 Essential (primary) hypertension: Secondary | ICD-10-CM | POA: Diagnosis not present

## 2018-02-23 DIAGNOSIS — Z79899 Other long term (current) drug therapy: Secondary | ICD-10-CM

## 2018-02-23 DIAGNOSIS — E119 Type 2 diabetes mellitus without complications: Secondary | ICD-10-CM | POA: Diagnosis present

## 2018-02-23 LAB — CBC WITH DIFFERENTIAL/PLATELET
ABS IMMATURE GRANULOCYTES: 0.19 10*3/uL — AB (ref 0.00–0.07)
Basophils Absolute: 0 10*3/uL (ref 0.0–0.1)
Basophils Relative: 0 %
Eosinophils Absolute: 0 10*3/uL (ref 0.0–0.5)
Eosinophils Relative: 0 %
HEMATOCRIT: 36.7 % — AB (ref 39.0–52.0)
HEMOGLOBIN: 11.2 g/dL — AB (ref 13.0–17.0)
IMMATURE GRANULOCYTES: 1 %
LYMPHS ABS: 2.2 10*3/uL (ref 0.7–4.0)
LYMPHS PCT: 12 %
MCH: 24.3 pg — AB (ref 26.0–34.0)
MCHC: 30.5 g/dL (ref 30.0–36.0)
MCV: 79.8 fL — AB (ref 80.0–100.0)
MONO ABS: 2.2 10*3/uL — AB (ref 0.1–1.0)
MONOS PCT: 12 %
NEUTROS ABS: 14 10*3/uL — AB (ref 1.7–7.7)
NEUTROS PCT: 75 %
Platelets: 229 10*3/uL (ref 150–400)
RBC: 4.6 MIL/uL (ref 4.22–5.81)
RDW: 14.1 % (ref 11.5–15.5)
WBC: 18.7 10*3/uL — ABNORMAL HIGH (ref 4.0–10.5)
nRBC: 0 % (ref 0.0–0.2)

## 2018-02-23 LAB — URINALYSIS, ROUTINE W REFLEX MICROSCOPIC
Bilirubin Urine: NEGATIVE
GLUCOSE, UA: NEGATIVE mg/dL
HGB URINE DIPSTICK: NEGATIVE
Ketones, ur: NEGATIVE mg/dL
Nitrite: NEGATIVE
PH: 5 (ref 5.0–8.0)
PROTEIN: NEGATIVE mg/dL
Specific Gravity, Urine: 1.011 (ref 1.005–1.030)

## 2018-02-23 LAB — COMPREHENSIVE METABOLIC PANEL
ALT: 22 U/L (ref 0–44)
AST: 23 U/L (ref 15–41)
Albumin: 3.6 g/dL (ref 3.5–5.0)
Alkaline Phosphatase: 49 U/L (ref 38–126)
Anion gap: 13 (ref 5–15)
BILIRUBIN TOTAL: 0.6 mg/dL (ref 0.3–1.2)
BUN: 66 mg/dL — ABNORMAL HIGH (ref 8–23)
CHLORIDE: 104 mmol/L (ref 98–111)
CO2: 19 mmol/L — ABNORMAL LOW (ref 22–32)
Calcium: 9.2 mg/dL (ref 8.9–10.3)
Creatinine, Ser: 4.12 mg/dL — ABNORMAL HIGH (ref 0.61–1.24)
GFR, EST AFRICAN AMERICAN: 16 mL/min — AB (ref >60)
GFR, EST NON AFRICAN AMERICAN: 13 mL/min — AB (ref >60)
Glucose, Bld: 99 mg/dL (ref 70–99)
POTASSIUM: 5 mmol/L (ref 3.5–5.1)
Sodium: 136 mmol/L (ref 135–145)
TOTAL PROTEIN: 7.5 g/dL (ref 6.5–8.1)

## 2018-02-23 LAB — INFLUENZA PANEL BY PCR (TYPE A & B)
INFLAPCR: NEGATIVE
Influenza B By PCR: NEGATIVE

## 2018-02-23 LAB — I-STAT CG4 LACTIC ACID, ED
Lactic Acid, Venous: 1.09 mmol/L (ref 0.5–1.9)
Lactic Acid, Venous: 2.15 mmol/L (ref 0.5–1.9)

## 2018-02-23 LAB — PROTIME-INR
INR: 1.21
Prothrombin Time: 15.2 seconds (ref 11.4–15.2)

## 2018-02-23 LAB — GLUCOSE, CAPILLARY: Glucose-Capillary: 137 mg/dL — ABNORMAL HIGH (ref 70–99)

## 2018-02-23 MED ORDER — LATANOPROST 0.005 % OP SOLN
1.0000 [drp] | Freq: Every day | OPHTHALMIC | Status: DC
  Administered 2018-02-23 – 2018-02-25 (×3): 1 [drp] via OPHTHALMIC
  Filled 2018-02-23: qty 2.5

## 2018-02-23 MED ORDER — TIMOLOL MALEATE 0.5 % OP SOLN
1.0000 [drp] | Freq: Two times a day (BID) | OPHTHALMIC | Status: DC
  Administered 2018-02-23 – 2018-02-26 (×5): 1 [drp] via OPHTHALMIC
  Filled 2018-02-23: qty 5

## 2018-02-23 MED ORDER — SIMVASTATIN 20 MG PO TABS
20.0000 mg | ORAL_TABLET | Freq: Every day | ORAL | Status: DC
  Administered 2018-02-23 – 2018-02-26 (×4): 20 mg via ORAL
  Filled 2018-02-23 (×4): qty 1

## 2018-02-23 MED ORDER — VANCOMYCIN HCL 10 G IV SOLR
2000.0000 mg | Freq: Once | INTRAVENOUS | Status: AC
  Administered 2018-02-23: 2000 mg via INTRAVENOUS
  Filled 2018-02-23: qty 2000

## 2018-02-23 MED ORDER — LACTATED RINGERS IV SOLN
INTRAVENOUS | Status: DC
  Administered 2018-02-23 – 2018-02-25 (×4): via INTRAVENOUS

## 2018-02-23 MED ORDER — ONDANSETRON HCL 4 MG/2ML IJ SOLN: 4.0000 mg | Freq: Four times a day (QID) | INTRAMUSCULAR | Status: DC | PRN

## 2018-02-23 MED ORDER — ALBUTEROL SULFATE (2.5 MG/3ML) 0.083% IN NEBU: 2.5000 mg | INHALATION_SOLUTION | RESPIRATORY_TRACT | Status: DC | PRN

## 2018-02-23 MED ORDER — SODIUM CHLORIDE 0.9 % IV BOLUS (SEPSIS)
500.0000 mL | Freq: Once | INTRAVENOUS | Status: AC
  Administered 2018-02-23: 500 mL via INTRAVENOUS

## 2018-02-23 MED ORDER — CALCITRIOL 0.25 MCG PO CAPS
0.2500 ug | ORAL_CAPSULE | Freq: Every day | ORAL | Status: DC
  Administered 2018-02-24 – 2018-02-26 (×3): 0.25 ug via ORAL
  Filled 2018-02-23 (×3): qty 1

## 2018-02-23 MED ORDER — ACETAMINOPHEN 325 MG PO TABS: 650.0000 mg | ORAL_TABLET | Freq: Four times a day (QID) | ORAL | Status: DC | PRN

## 2018-02-23 MED ORDER — ACETAMINOPHEN 650 MG RE SUPP: 650.0000 mg | Freq: Four times a day (QID) | RECTAL | Status: DC | PRN

## 2018-02-23 MED ORDER — HYDROCORTISONE NA SUCCINATE PF 100 MG IJ SOLR
100.0000 mg | Freq: Three times a day (TID) | INTRAMUSCULAR | Status: DC
  Administered 2018-02-23 – 2018-02-24 (×2): 100 mg via INTRAVENOUS
  Filled 2018-02-23 (×2): qty 2

## 2018-02-23 MED ORDER — SODIUM CHLORIDE 0.9 % IV BOLUS (SEPSIS)
1000.0000 mL | Freq: Once | INTRAVENOUS | Status: AC
  Administered 2018-02-23: 1000 mL via INTRAVENOUS

## 2018-02-23 MED ORDER — TESTOSTERONE 20.25 MG/1.25GM (1.62%) TD GEL: 1.0000 | Freq: Every day | TRANSDERMAL | Status: DC

## 2018-02-23 MED ORDER — SODIUM CHLORIDE 0.9 % IV SOLN
2.0000 g | INTRAVENOUS | Status: DC
  Administered 2018-02-23: 2 g via INTRAVENOUS
  Filled 2018-02-23: qty 20

## 2018-02-23 MED ORDER — VANCOMYCIN VARIABLE DOSE PER UNSTABLE RENAL FUNCTION (PHARMACIST DOSING): Status: DC

## 2018-02-23 MED ORDER — ACETAMINOPHEN 325 MG PO TABS
650.0000 mg | ORAL_TABLET | Freq: Once | ORAL | Status: AC
  Administered 2018-02-23: 650 mg via ORAL
  Filled 2018-02-23: qty 2

## 2018-02-23 MED ORDER — HYDROCORTISONE NA SUCCINATE PF 100 MG IJ SOLR
100.0000 mg | Freq: Once | INTRAMUSCULAR | Status: AC
  Administered 2018-02-23: 100 mg via INTRAVENOUS
  Filled 2018-02-23: qty 2

## 2018-02-23 MED ORDER — HEPARIN SODIUM (PORCINE) 5000 UNIT/ML IJ SOLN
5000.0000 [IU] | Freq: Three times a day (TID) | INTRAMUSCULAR | Status: DC
  Administered 2018-02-23 – 2018-02-26 (×8): 5000 [IU] via SUBCUTANEOUS
  Filled 2018-02-23 (×8): qty 1

## 2018-02-23 MED ORDER — ONDANSETRON HCL 4 MG PO TABS: 4.0000 mg | ORAL_TABLET | Freq: Four times a day (QID) | ORAL | Status: DC | PRN

## 2018-02-23 MED ORDER — SODIUM CHLORIDE 0.9 % IV SOLN
1.0000 g | INTRAVENOUS | Status: DC
  Administered 2018-02-23 – 2018-02-25 (×3): 1 g via INTRAVENOUS
  Filled 2018-02-23 (×4): qty 1

## 2018-02-23 MED ORDER — LEVETIRACETAM 500 MG PO TABS
500.0000 mg | ORAL_TABLET | Freq: Two times a day (BID) | ORAL | Status: DC
  Administered 2018-02-23 – 2018-02-26 (×6): 500 mg via ORAL
  Filled 2018-02-23 (×6): qty 1

## 2018-02-23 MED ORDER — LEVOTHYROXINE SODIUM 88 MCG PO TABS
88.0000 ug | ORAL_TABLET | Freq: Every day | ORAL | Status: DC
  Administered 2018-02-24 – 2018-02-26 (×3): 88 ug via ORAL
  Filled 2018-02-23 (×3): qty 1

## 2018-02-23 MED ORDER — POLYETHYLENE GLYCOL 3350 17 G PO PACK: 17.0000 g | PACK | Freq: Every day | ORAL | Status: DC | PRN

## 2018-02-23 MED ORDER — INSULIN ASPART 100 UNIT/ML ~~LOC~~ SOLN
0.0000 [IU] | Freq: Three times a day (TID) | SUBCUTANEOUS | Status: DC
  Administered 2018-02-24 (×2): 2 [IU] via SUBCUTANEOUS
  Administered 2018-02-25 (×2): 1 [IU] via SUBCUTANEOUS

## 2018-02-23 NOTE — ED Notes (Signed)
Attempted report x1. 

## 2018-02-23 NOTE — H&P (Signed)
HISTORY AND PHYSICAL       PATIENT DETAILS Name: Trevor Kelley Age: 67 y.o. Sex: male Date of Birth: [birth year] Admit Date: 02/23/2018 [id number], Jeneen Rinks, MD   Patient coming from: Home   CHIEF COMPLAINT:  Weakness, right shoulder pain, fever for the past 3-4 days.  HPI: Trevor Kelley is a 66 y.o. 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  Per patient since earlier this week-he started feeling weak and fatigued.  He was sleeping most of the time.  He did also start having subjective fever with chills since yesterday.  For the past few days he has had some intermittent "aching" in his right shoulder-he claims that a few days back he was not able to even move his shoulder freely, however over the past few days the shoulder pain has abated and he is much comfortable today.  Since he continued to be weak-and was having subjective fever-he was brought to the emergency room, where he was found to be febrile (temp 103), hypotensive to 70 systolic-blood work revealed AKI.  Hospitalist service was consulted for further inpatient treatment.  Patient denies any headache, denies any neck pain.  He denies any cough or shortness of breath.  Patient denies any abdominal pain.  He denies any nausea, vomiting or diarrhea.  He has not noticed any skin discoloration from his usual self.  Patient does complain of myalgias mostly around the right shoulder.  ED Course:  Found to be hypotensive-creatinine up to 4.12-given ceftriaxone and 100 mg of IV hydrocortisone.  Given IV fluid boluses.  Blood cultures drawn.  Note: Lives at: Home Mobility: Independent Chronic Indwelling Foley:no   REVIEW OF SYSTEMS:  Constitutional:   No  weight loss, night sweats,  chills  HEENT:    No headaches,  Dysphagia,Tooth/dental problems,Sore throat  Cardio-vascular: No chest pain,Orthopnea, PND,lower extremity edema, anasarca, palpitations  GI:  No heartburn, indigestion, abdominal pain, nausea, vomiting, diarrhea, melena or hematochezia  Resp: No shortness of breath, hemoptysis,plueritic chest pain.   Skin:  No rash or lesions.  GU:  No dysuria, change in color of urine, no urgency or frequency.    Musculoskeletal: No joint pain or swelling.  No decreased range of motion.   Endocrine: No heat intolerance, no cold intolerance, no polyuria, no polydipsia  Psych: No change in mood or affect. No depression or anxiety.  No memory loss.   ALLERGIES:  No Known Allergies  PAST MEDICAL HISTORY: Past Medical History:  Diagnosis Date  . CKD (chronic kidney disease)   . Diabetes mellitus without complication ([place])   . History of pituitary adenoma   . Hypertension   . Hypothyroidism   . OSA (obstructive sleep apnea)   . Seizure disorder ([place]) 05/31/2017    PAST SURGICAL HISTORY: Past Surgical History:  Procedure Laterality Date  . BRAIN SURGERY      MEDICATIONS AT HOME: Prior to Admission medications   Medication Sig Start Date End Date Taking? Authorizing Provider  amLODipine (NORVASC) 10 MG tablet Take 10 mg by mouth 2 (two) times daily.     [provider]  calcitRIOL (ROCALTROL) 0.25 MCG capsule Take 0.25 mcg by mouth daily.    [provider]  carvedilol (COREG) 12.5 MG tablet Take 12.5 mg by mouth 2 (two) times daily with a meal.    [provider]  furosemide (LASIX) 80 MG tablet Take 80 mg by mouth 2 (two) times daily.    [provider]  hydrocortisone (CORTEF) 10 MG tablet Take 10-15 mg by mouth See admin instructions. Take 1 and 1/2 tablets every morning and take 1 tablet daily 02/25/17   [provider]  levETIRAcetam (KEPPRA) 500 MG tablet Take 1 tablet (500 mg total) by mouth 2 (two) times daily. 05/31/17   Kathrynn Ducking, MD  levothyroxine (SYNTHROID, LEVOTHROID) 88 MCG tablet Take 88 mcg by mouth daily before breakfast.    [provider]  lisinopril (PRINIVIL,ZESTRIL) 40 MG tablet Take 40 mg by mouth 2 (two) times daily.     [provider]  LUMIGAN 0.01 % SOLN INSTILL 1 DROP(S) IN [facility] EYE BY OPHTHALMIC ROUTE AT BEDTIME 11/08/17   [provider]  potassium chloride SA (K-DUR,KLOR-CON) 20 MEQ tablet Take 40 mEq by mouth 3 (three) times daily.    [provider]  simvastatin (ZOCOR) 20 MG tablet Take 20 mg by mouth daily.    [provider]  spironolactone (ALDACTONE) 25 MG tablet Take 25 mg by mouth 2 (two) times daily.    [provider]  tamsulosin (FLOMAX) 0.4 MG CAPS capsule Take 0.4 mg by mouth daily.     [provider]  Testosterone 20.25 MG/1.25GM (1.62%) GEL Apply 1 packet topically daily. 11/23/16   [provider]  timolol (TIMOPTIC) 0.5 % ophthalmic solution Place 1 drop into both eyes 2 (two) times daily. 10/26/16   [provider]    FAMILY HISTORY: Family History  Problem Relation Age of Onset  . Hypothyroidism Unknown   . Diabetes Mellitus II Unknown   . Cancer Unknown   . Hypertension Unknown     SOCIAL HISTORY:  reports that he has never smoked. He has never used smokeless tobacco. He reports that he does not drink alcohol or use drugs.  PHYSICAL EXAM: Blood pressure 92/60, pulse 71, temperature (!) 102 F (38.9 C), temperature source Rectal, resp. rate 13, height 6' (1.829 m), weight 104.3 kg, SpO2 98 %.  General appearance :Awake, alert, not in any distress. Eyes:, pupils equally reactive to light and accomodation,no scleral icterus. HEENT: Atraumatic and Normocephalic Neck: supple, no JVD.  Resp:Good air entry bilaterally, no added sounds  CVS: S1 S2 regular, no murmurs.  GI: Bowel sounds present, Non tender and not distended with no gaurding, rigidity or rebound.No  organomegaly Extremities: B/L Lower Ext shows no edema, both legs are warm to touch Neurology:  speech clear,Non focal, sensation is grossly intact. Psychiatric: Normal judgment and insight. Alert and oriented x 3. Normal mood. Musculoskeletal:gait appears to be normal.No digital cyanosis Skin:No Rash, warm and dry Wounds:N/A  LABS ON ADMISSION:  I have personally reviewed following labs and imaging studies  CBC: Recent Labs  Lab 02/23/18 1129  WBC 18.7*  NEUTROABS 14.0*  HGB 11.2*  HCT 36.7*  MCV 79.8*  PLT 621    Basic Metabolic Panel: Recent Labs  Lab 02/23/18 1150  NA 136  K 5.0  CL 104  CO2 19*  GLUCOSE 99  BUN 66*  CREATININE 4.12*  CALCIUM 9.2    GFR: Estimated Creatinine Clearance: 21.1 mL/min (A) (by C-G formula based on SCr of 4.12 mg/dL (H)).  Liver Function Tests: Recent Labs  Lab 02/23/18 1150  AST 23  ALT 22  ALKPHOS 49  BILITOT 0.6  PROT 7.5  ALBUMIN 3.6   No results for input(s): LIPASE,  AMYLASE in the last 168 hours. No results for input(s): AMMONIA in the last 168 hours.  Coagulation Profile: Recent Labs  Lab 02/23/18 1129  INR 1.21    Cardiac Enzymes: No results for input(s): CKTOTAL, CKMB, CKMBINDEX, TROPONINI in the last 168 hours.  BNP (last 3 results) No results for input(s): PROBNP in the last 8760 hours.  HbA1C: No results for input(s): HGBA1C in the last 72 hours.  CBG: No results for input(s): GLUCAP in the last 168 hours.  Lipid Profile: No results for input(s): CHOL, HDL, LDLCALC, TRIG, CHOLHDL, LDLDIRECT in the last 72 hours.  Thyroid Function Tests: No results for input(s): TSH, T4TOTAL, FREET4, T3FREE, THYROIDAB in the last 72 hours.  Anemia Panel: No results for input(s): VITAMINB12, FOLATE, FERRITIN, TIBC, IRON, RETICCTPCT in the last 72 hours.  Urine analysis:    Component Value Date/Time   COLORURINE YELLOW 02/23/2018 1406   APPEARANCEUR CLEAR 02/23/2018 1406   LABSPEC 1.011 02/23/2018 1406    PHURINE 5.0 02/23/2018 1406   GLUCOSEU NEGATIVE 02/23/2018 1406   [place] 02/23/2018 1406   [place] 02/23/2018 1406   [place] 02/23/2018 1406   PROTEINUR NEGATIVE 02/23/2018 1406   NITRITE NEGATIVE 02/23/2018 1406   LEUKOCYTESUR MODERATE (A) 02/23/2018 1406    Sepsis Labs: Lactic Acid, Venous    Component Value Date/Time   LATICACIDVEN 1.09 02/23/2018 1319     Microbiology: No results found for this or any previous visit (from the past 240 hour(s)).    RADIOLOGIC STUDIES ON ADMISSION: Dg Chest Port 1 View  Result Date: 02/23/2018 CLINICAL DATA:  67 year old male with chills and body ache for 2 days. Fever of 100 F. Sepsis. EXAM: PORTABLE CHEST 1 VIEW COMPARISON:  01/01/2017 chest radiographs. FINDINGS: Portable AP upright view at 1150 hours. Stable tortuosity of the thoracic aorta. Other mediastinal contours are within normal limits. Visualized tracheal air column is within normal limits. Allowing for portable technique the lungs are clear. No pneumothorax or pleural effusion. IMPRESSION: Negative.  No cardiopulmonary abnormality. Electronically Signed   By: Genevie Ann M.D.   On: 02/23/2018 12:17    EKG:  Personally reviewed.  Sinus tachycardia  ASSESSMENT AND PLAN: Severe sepsis: Hypotensive on presentation-immunocompromised (chronic steroids with history of panhypopituitarism) no foci of infection apparent on imaging, UA or by physical exam.  I do not see any foci of cellulitis on my exam in his lower extremities.  He does complain of intermittent right shoulder pain-but has no signs of right shoulder infection.  Plans are to continue IV fluid hydration, stress dose hydrocortisone-empiric IV antimicrobial therapy with vancomycin and cefepime.  We will going obtain a CT scan of his right shoulder without contrast.  We will go and check a influenza panel as well.  He will be monitored closely in a stepdown unit.  AKI on stage III chronic kidney disease:  Likely hemodynamically mediated in the setting of sepsis, and use of lisinopril/diuretics.  Electrolytes relatively stable-check frequent bladder scans, renal ultrasound to make sure he has no obstructive uropathy contributing.  UA without proteinuria.  Continue to hydrate-avoid nephrotoxic agents and repeat electrolytes tomorrow.  If renal function worsens-we will consult nephrology.  Panhypopituitarism: Secondary to excision of the pituitary adenoma-we will stress dose with IV hydrocortisone 100 mg 3 times daily given acute illness, continue Synthroid.  DM-2: Hold all oral hypoglycemics-monitor CBGs with exercise.  Hypertension: Hold all antihypertensives-resume when able-when blood pressure allows.  OSA: CPAP nightly  Further plan will depend as patient's clinical  course evolves and further radiologic and laboratory data become available. Patient will be monitored closely.  Above noted plan was discussed with patient/spouse face to face at bedside, they were in agreement.   CONSULTS: None   DVT Prophylaxis: Prophylactic Heparin  Code Status: Full Code  Disposition Plan:  Discharge back home  possibly in 3-5 days, depending on clinical course  Admission status:  Inpatient going toSDU  The medical decision making on this patient was of high complexity and the patient is at high risk for clinical deterioration, therefore this is a level 3 visit.   Total time spent  55 minutes.Greater than 50% of this time was spent in counseling, explanation of diagnosis, planning of further management, and coordination of care.  Severity of Illness: The appropriate patient status for this patient is INPATIENT. Inpatient status is judged to be reasonable and necessary in order to provide the required intensity of service to ensure the patient's safety. The patient's presenting symptoms, physical exam findings, and initial radiographic and laboratory data in the context of their chronic comorbidities  is felt to place them at high risk for further clinical deterioration. Furthermore, it is not anticipated that the patient will be medically stable for discharge from the hospital within 2 midnights of admission. The following factors support the patient status of inpatient.   " The patient's presenting symptoms include sepsis with hypotension and acute kidney injury " The worrisome physical exam findings include lethargy " The initial radiographic and laboratory data are worrisome because of acute kidney injury " The chronic co-morbidities include panhypopituitarism, DM-2, immunocompromise state, hypertension  * I certify that at the point of admission it is my clinical judgment that the patient will require inpatient hospital care spanning beyond 2 midnights from the point of admission due to high intensity of service, high risk for further deterioration and high frequency of surveillance required.  Oren Binet [facility] Pager [phone]  If 7PM-7AM, please contact night-coverage [email or web address] Password [email or web address] 02/23/2018, 3:36 PM

## 2018-02-23 NOTE — ED Provider Notes (Signed)
[facility] EMERGENCY DEPARTMENT Provider Note   CSN: [id number] Arrival date & time: 02/23/18  1112     History   Chief Complaint Chief Complaint  Patient presents with  . Chills  . Generalized Body Aches    HPI VERA WISHART is a 67 y.o. male.  Presenting from home with chills and bilateral shoulder pain.  He thinks he injured his right rotator cuff last week and has been giving him some troubles.  For the last 2 days he is been very achy.  At triage she was noted to be febrile and hypotensive.  No sick contacts no recent travel.  Denies any headache cough abdominal pain nausea vomiting or urine symptoms.  Denies any rashes or tick bites.  The history is provided by the patient and the spouse.  Illness  This is a new problem. The current episode started yesterday. The problem occurs constantly. The problem has not changed since onset.Pertinent negatives include no chest pain, no abdominal pain, no headaches and no shortness of breath. Nothing aggravates the symptoms. Nothing relieves the symptoms. He has tried nothing for the symptoms. The treatment provided no relief.    Past Medical History:  Diagnosis Date  . CKD (chronic kidney disease)   . Diabetes mellitus without complication ([place])   . History of pituitary adenoma   . Hypertension   . Hypothyroidism   . OSA (obstructive sleep apnea)   . Seizure disorder ([place]) 05/31/2017    Patient Active Problem List   Diagnosis Date Noted  . Seizure disorder ([place]) 05/31/2017  . AKI (acute kidney injury) ([place]) 01/03/2017  . Thrombocytopenia ([place]) 01/01/2017  . Hypokalemia 01/01/2017  . Sepsis ([place]) 12/31/2016  . Cellulitis 12/31/2016  . Diarrhea 12/31/2016  . Anemia 12/31/2016  . OSA (obstructive sleep apnea) 12/03/2014  . Pituitary adenoma ([place]) 02/12/2014  . Diabetes mellitus type 2 in obese ([place]) 02/05/2014  . Obesity (BMI 30-39.9) 02/05/2014  . 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 BRAIN SURGERY          Home Medications    Prior to Admission medications   Medication Sig Start Date End Date Taking? Authorizing Provider  amLODipine (NORVASC) 10 MG tablet Take 10 mg by mouth 2 (two) times daily.     [provider]  calcitRIOL (ROCALTROL) 0.25 MCG capsule Take 0.25 mcg by mouth daily.    [provider]  carvedilol (COREG) 12.5 MG tablet Take 12.5 mg by mouth 2 (two) times daily with a meal.    [provider]  furosemide (LASIX) 80 MG tablet Take 80 mg by mouth 2 (two) times daily.    [provider]  hydrocortisone (CORTEF) 10 MG tablet Take 10-15 mg by mouth See admin instructions. Take 1 and 1/2 tablets every morning and take 1 tablet daily 02/25/17   [provider]  levETIRAcetam (KEPPRA) 500 MG tablet Take 1 tablet (500 mg total) by mouth 2 (two) times daily. 05/31/17   Kathrynn Ducking, MD  levothyroxine (SYNTHROID, LEVOTHROID) 88 MCG tablet Take 88 mcg by mouth daily before breakfast.    [provider]  lisinopril (PRINIVIL,ZESTRIL) 40 MG tablet Take 40 mg by mouth 2 (two) times daily.     [provider]  LUMIGAN 0.01 % SOLN INSTILL 1 DROP(S) IN [facility] BY OPHTHALMIC ROUTE AT BEDTIME 11/08/17   [provider]  potassium chloride SA (K-DUR,KLOR-CON) 20 MEQ tablet Take 40 mEq by mouth 3 (three) times daily.    [provider]  simvastatin (ZOCOR) 20 MG tablet Take 20 mg by mouth daily.    [provider]  spironolactone (ALDACTONE) 25 MG tablet Take 25 mg by mouth 2 (two) times daily.    [provider]  tamsulosin (FLOMAX) 0.4 MG CAPS capsule Take 0.4 mg by mouth daily.     [provider]  Testosterone 20.25 MG/1.25GM (1.62%) GEL Apply 1 packet topically daily. 11/23/16   [provider]  timolol (TIMOPTIC)  0.5 % ophthalmic solution Place 1 drop into both eyes 2 (two) times daily. 10/26/16   [provider]    Family History Family History  Problem Relation Age of Onset  . Hypothyroidism Unknown   . Diabetes Mellitus II Unknown   . Cancer Unknown   . Hypertension Unknown     Social History Social History   Tobacco Use  . Smoking status: Never Smoker  . Smokeless tobacco: Never Used  Substance Use Topics  . Alcohol use: No  . Drug use: No     Allergies   Patient has no known allergies.   Review of Systems Review of Systems  Constitutional: Positive for chills, fatigue and fever.  HENT: Negative for sore throat.   Eyes: Negative for visual disturbance.  Respiratory: Negative for shortness of breath.   Cardiovascular: Negative for chest pain.  Gastrointestinal: Negative for abdominal pain.  Genitourinary: Negative for dysuria.  Musculoskeletal: Positive for arthralgias and myalgias.  Skin: Negative for rash.  Neurological: Negative for headaches.     Physical Exam Updated Vital Signs BP 100/70 (BP Location: Left Arm)   Pulse (!) 110   Temp 100 F (37.8 C) (Oral)   Resp 18   Ht 6' (1.829 m)   Wt 104.3 kg   SpO2 98%   BMI 31.19 kg/m   Physical Exam  Constitutional: He appears well-developed and well-nourished. No distress.  HENT:  Head: Normocephalic and atraumatic.  Right Ear: External ear normal.  Left Ear: External ear normal.  Nose: Nose normal.  Mouth/Throat: Oropharynx is clear and moist.  Eyes: Pupils are equal, round, and reactive to light. Conjunctivae and EOM are normal.  Neck: Neck supple.  Cardiovascular: Regular rhythm and normal heart sounds. Tachycardia present.  No murmur heard. Pulmonary/Chest: Effort normal and breath sounds normal. No respiratory distress.  Abdominal: Soft. He exhibits no mass. There is no tenderness. There is no guarding.  Musculoskeletal: Normal range of motion. He exhibits no edema, tenderness or deformity.    Neurological: He is alert.  Skin: Skin is warm and dry. Capillary refill takes less than 2 seconds.  There is some redness and warmth of his right lower leg.  No obvious open wounds.  Psychiatric: He has a normal mood and affect.  Nursing note and vitals reviewed.    ED Treatments / Results  Labs (all labs ordered are listed, but only abnormal results are displayed) Labs Reviewed  CBC WITH DIFFERENTIAL/PLATELET - Abnormal; Notable for the following components:      Result Value   WBC 18.7 (*)    Hemoglobin 11.2 (*)    HCT 36.7 (*)    MCV 79.8 (*)    MCH 24.3 (*)    Neutro Abs 14.0 (*)    Monocytes Absolute 2.2 (*)    Abs Immature Granulocytes 0.19 (*)    All other components within normal limits  COMPREHENSIVE METABOLIC PANEL - Abnormal; Notable for the following components:   CO2 19 (*)    BUN 66 (*)    Creatinine, Ser 4.12 (*)    GFR calc non Af Amer 13 (*)    GFR calc Af Amer 16 (*)    All other components within normal limits  URINALYSIS, ROUTINE W REFLEX MICROSCOPIC - Abnormal; Notable for the following components:   Leukocytes, UA MODERATE (*)    Bacteria, UA RARE (*)    All other components within normal limits  CBC - Abnormal; Notable for the following components:   WBC 15.1 (*)    RBC 3.88 (*)    Hemoglobin 9.7 (*)    HCT 30.2 (*)    MCV 77.8 (*)    MCH 25.0 (*)    All other components within normal limits  COMPREHENSIVE METABOLIC PANEL - Abnormal; Notable for the following components:   CO2 17 (*)    Glucose, Bld 134 (*)    BUN 58 (*)    Creatinine, Ser 3.54 (*)    Calcium 8.6 (*)    Albumin 2.9 (*)    GFR calc non Af Amer 16 (*)    GFR calc Af Amer 19 (*)    All other components within normal limits  GLUCOSE, CAPILLARY - Abnormal; Notable for the following components:   Glucose-Capillary 137 (*)    All other components within normal limits  GLUCOSE, CAPILLARY - Abnormal; Notable for the following components:   Glucose-Capillary 117 (*)    All  other components within normal limits  I-STAT CG4 LACTIC ACID, ED - Abnormal; Notable for the following components:   Lactic Acid, Venous 2.15 (*)    All other components within normal limits  CULTURE, BLOOD (ROUTINE X 2)  CULTURE, BLOOD (ROUTINE X 2)  MRSA PCR SCREENING  PROTIME-INR  INFLUENZA PANEL BY PCR (TYPE A & B)  HIV ANTIBODY (ROUTINE TESTING W REFLEX)  I-STAT CG4 LACTIC ACID, ED    EKG EKG Interpretation  Date/Time:  Thursday February 23 2018 11:36:09 EDT Ventricular Rate:  101 PR Interval:    QRS Duration: 97 QT Interval:  333 QTC Calculation: 432 R Axis:   0 Text Interpretation:  Sinus tachycardia Borderline repolarization abnormality Baseline wander in lead(s) I III aVR aVL aVF V2 V4 V5 V6 similar pattern to prior 10/18 Confirmed by Aletta Edouard [phone]) on 02/23/2018 12:39:09 PM Also confirmed by Aletta Edouard [phone]), editor Hattie Perch ([id number])  on 02/23/2018 2:08:41 PM   Radiology Ct Shoulder Right Wo Contrast  Result Date: 02/23/2018 CLINICAL DATA:  Intermittent aching in the right shoulder question shoulder infection EXAM: CT OF THE UPPER RIGHT EXTREMITY WITHOUT CONTRAST TECHNIQUE: Multidetector CT imaging of the upper right extremity was performed according to the standard protocol. COMPARISON:  None. FINDINGS: Bones/Joint/Cartilage Osteoarthritis of the [facility] and glenohumeral joints. No joint effusion or frank bone destruction. No fracture identified about the right shoulder. Ligaments Suboptimally assessed by CT. Muscles and Tendons Muscle edema, hemorrhage, mass or atrophy. Biceps tendon is seated within its biceps groove. 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 No evidence of septic arthritis, osteomyelitis or fracture. Electronically Signed   By: Ashley Royalty M.D.   On: 02/23/2018 17:19   US Renal  Result Date: 02/24/2018 CLINICAL DATA:  67 year old male with a history of acute kidney injury EXAM: RENAL /  URINARY TRACT ULTRASOUND COMPLETE COMPARISON:  None. FINDINGS: Right Kidney: Length: 12.1 cm. No evidence of hydronephrosis. Flow confirmed  in the hilum of the right kidney. Anechoic lesion at the superolateral cortex of the right kidney with through transmission and no internal complexity or flow measures 3.3 cm. Left Kidney: Length: 12.4 cm. No evidence of hydronephrosis. Flow confirmed in the hilum of the left kidney. Anechoic lesion at the inferolateral cortex of the left kidney with through transmission, no internal flow or complexity measures 2.4 cm. Bladder: Prostate tissue with significant impression on the bladder base with otherwise unremarkable bladder. IMPRESSION: No evidence of hydronephrosis. Bilateral renal cysts. Large impression of the bladder base by prostate. Recommend correlation with possible chronic bladder outlet obstruction. Electronically Signed   By: Corrie Mckusick D.O.   On: 02/24/2018 09:16   Dg Chest Port 1 View  Result Date: 02/23/2018 CLINICAL DATA:  67 year old male with chills and body ache for 2 days. Fever of 100 F. Sepsis. EXAM: PORTABLE CHEST 1 VIEW COMPARISON:  01/01/2017 chest radiographs. FINDINGS: Portable AP upright view at 1150 hours. Stable tortuosity of the thoracic aorta. Other mediastinal contours are within normal limits. Visualized tracheal air column is within normal limits. Allowing for portable technique the lungs are clear. No pneumothorax or pleural effusion. IMPRESSION: Negative.  No cardiopulmonary abnormality. Electronically Signed   By: Genevie Ann M.D.   On: 02/23/2018 12:17    Procedures .Critical Care Performed by: Hayden Rasmussen, MD Authorized by: Hayden Rasmussen, MD   Critical care provider statement:    Critical care time (minutes):  45   Critical care was necessary to treat or prevent imminent or life-threatening deterioration of the following conditions:  Sepsis and renal failure   Critical care was time spent personally by me on the  following activities:  Discussions with consultants, evaluation of patient's response to treatment, examination of patient, ordering and performing treatments and interventions, ordering and review of laboratory studies, ordering and review of radiographic studies, pulse oximetry, re-evaluation of patient's condition, obtaining history from patient or surrogate, review of old charts and development of treatment plan with patient or surrogate   I assumed direction of critical care for this patient from another provider in my specialty: no     (including critical care time)  Medications Ordered in ED Medications  sodium chloride 0.9 % bolus 1,000 mL (has no administration in time range)    And  sodium chloride 0.9 % bolus 1,000 mL (has no administration in time range)    And  sodium chloride 0.9 % bolus 1,000 mL (has no administration in time range)    And  sodium chloride 0.9 % bolus 500 mL (has no administration in time range)  acetaminophen (TYLENOL) tablet 650 mg (has no administration in time range)     Initial Impression / Assessment and Plan / ED Course  I have reviewed the triage vital signs and the nursing notes.  Pertinent labs & imaging results that were available during my care of the patient were reviewed by me and considered in my medical decision making (see chart for details).  Clinical Course as of Feb 24 1217  Thu Feb 23, 6766  [id number] 67 year old male here with fever chills and hypotension.  Presumptive diagnosis of sepsis although he does not have a clear source.  He does have some erythema of his right lower leg although it is not tender.  His labs are starting to come back and is an elevated white blood cell count of 18.  He has a history of adrenal insufficiency and is on steroids.  I have  ordered him a dose of hydrocortisone and starting empiric antibiotics ceftriaxone 2 g IV.   [MB]  5825 Patient states his blood pressure usually runs around 100.  He is currently with  systolics in the 18F.   [MB]  8421 Discussed with tried hospitalist who will evaluate patient in the ED for admission.   [MB]    Clinical Course User Index [MB] Hayden Rasmussen, MD      Final Clinical Impressions(s) / ED Diagnoses   Final diagnoses:  Sepsis with acute renal failure and septic shock, due to unspecified organism, unspecified acute renal failure type ([place])  Cellulitis of right lower extremity  AKI (acute kidney injury) [facility])    ED Discharge Orders    None       Hayden Rasmussen, MD 02/24/18 1219

## 2018-02-23 NOTE — ED Triage Notes (Addendum)
Pt endorses chills and generalized bodyaches x 2 days. 100.0 oral temp in triage. Denies any other sx. Tachy. Hypotensive 73/61 and 64/49. Pt placed in reclining chair, laid back, then BP manual 100/70. Axox4.

## 2018-02-23 NOTE — Progress Notes (Signed)
Pharmacy Antibiotic Note  Trevor Kelley is a 67 y.o. male admitted on 02/23/2018 with sepsis of unknown source.  Pharmacy has been consulted for Vancomycin and Cefepime dosing. Pt received one dose of ceftriaxone 2g in ED. Pt likely has acute on chronic AKI d/t elevated Scr of 4.12, up from baseline at 1.4-2.0. Will continue to monitor renal function prior to ordering next vanc dose. WBC - 18.7, Tmax - 102, lactic acid - 1.09, Scr-4.12  Plan: Vancomcyin 2g x1 in ED; will continue to monitor renal function and adjust vanc dose accordingly Cefepime 1g q24h Monitor and adjust abx per renal fx, C&S, vanc trough as needed  Height: 6' (182.9 cm) Weight: 230 lb (104.3 kg) IBW/kg (Calculated) : 77.6  Temp (24hrs), Avg:101 F (38.3 C), Min:100 F (37.8 C), Max:102 F (38.9 C)  Recent Labs  Lab 02/23/18 1129 02/23/18 1150 02/23/18 1314 02/23/18 1319  WBC 18.7*  --   --   --   CREATININE  --  4.12*  --   --   LATICACIDVEN  --   --  2.15* 1.09    Estimated Creatinine Clearance: 21.1 mL/min (A) (by C-G formula based on SCr of 4.12 mg/dL (H)).    No Known Allergies  Antimicrobials this admission: Ceftriaxone 10/17 x1 Cefepime 10/17 >>  Vancomycin 10/17 >>  Dose adjustments this admission: N/A  Microbiology results: Pending  Thank you for allowing pharmacy to be a part of this patient's care.  Tyson Babinski 02/23/2018 4:03 PM

## 2018-02-24 ENCOUNTER — Other Ambulatory Visit: Payer: Self-pay

## 2018-02-24 ENCOUNTER — Inpatient Hospital Stay (HOSPITAL_COMMUNITY): Payer: Medicare Other

## 2018-02-24 ENCOUNTER — Encounter (HOSPITAL_COMMUNITY): Payer: Self-pay

## 2018-02-24 LAB — COMPREHENSIVE METABOLIC PANEL
ALT: 18 U/L (ref 0–44)
ANION GAP: 9 (ref 5–15)
AST: 18 U/L (ref 15–41)
Albumin: 2.9 g/dL — ABNORMAL LOW (ref 3.5–5.0)
Alkaline Phosphatase: 39 U/L (ref 38–126)
BILIRUBIN TOTAL: 0.5 mg/dL (ref 0.3–1.2)
BUN: 58 mg/dL — ABNORMAL HIGH (ref 8–23)
CO2: 17 mmol/L — ABNORMAL LOW (ref 22–32)
Calcium: 8.6 mg/dL — ABNORMAL LOW (ref 8.9–10.3)
Chloride: 110 mmol/L (ref 98–111)
Creatinine, Ser: 3.54 mg/dL — ABNORMAL HIGH (ref 0.61–1.24)
GFR calc Af Amer: 19 mL/min — ABNORMAL LOW (ref >60)
GFR, EST NON AFRICAN AMERICAN: 16 mL/min — AB (ref >60)
Glucose, Bld: 134 mg/dL — ABNORMAL HIGH (ref 70–99)
Potassium: 4.7 mmol/L (ref 3.5–5.1)
Sodium: 136 mmol/L (ref 135–145)
TOTAL PROTEIN: 6.5 g/dL (ref 6.5–8.1)

## 2018-02-24 LAB — CBC
HEMATOCRIT: 30.2 % — AB (ref 39.0–52.0)
Hemoglobin: 9.7 g/dL — ABNORMAL LOW (ref 13.0–17.0)
MCH: 25 pg — ABNORMAL LOW (ref 26.0–34.0)
MCHC: 32.1 g/dL (ref 30.0–36.0)
MCV: 77.8 fL — AB (ref 80.0–100.0)
Platelets: 198 10*3/uL (ref 150–400)
RBC: 3.88 MIL/uL — ABNORMAL LOW (ref 4.22–5.81)
RDW: 14.3 % (ref 11.5–15.5)
WBC: 15.1 10*3/uL — ABNORMAL HIGH (ref 4.0–10.5)
nRBC: 0 % (ref 0.0–0.2)

## 2018-02-24 LAB — GLUCOSE, CAPILLARY
GLUCOSE-CAPILLARY: 171 mg/dL — AB (ref 70–99)
Glucose-Capillary: 117 mg/dL — ABNORMAL HIGH (ref 70–99)
Glucose-Capillary: 163 mg/dL — ABNORMAL HIGH (ref 70–99)
Glucose-Capillary: 152 mg/dL — ABNORMAL HIGH (ref 70–99)

## 2018-02-24 LAB — HIV ANTIBODY (ROUTINE TESTING W REFLEX): HIV Screen 4th Generation wRfx: NONREACTIVE

## 2018-02-24 LAB — MRSA PCR SCREENING: MRSA BY PCR: NEGATIVE

## 2018-02-24 MED ORDER — PANTOPRAZOLE SODIUM 40 MG PO TBEC
40.0000 mg | DELAYED_RELEASE_TABLET | Freq: Every day | ORAL | Status: DC
  Administered 2018-02-24 – 2018-02-26 (×3): 40 mg via ORAL
  Filled 2018-02-24 (×3): qty 1

## 2018-02-24 MED ORDER — INFLUENZA VAC SPLIT HIGH-DOSE 0.5 ML IM SUSY
0.5000 mL | PREFILLED_SYRINGE | INTRAMUSCULAR | Status: AC
  Administered 2018-02-25: 0.5 mL via INTRAMUSCULAR
  Filled 2018-02-24: qty 0.5

## 2018-02-24 MED ORDER — HYDROCORTISONE NA SUCCINATE PF 100 MG IJ SOLR
75.0000 mg | Freq: Three times a day (TID) | INTRAMUSCULAR | Status: DC
  Administered 2018-02-24 – 2018-02-25 (×3): 75 mg via INTRAVENOUS
  Filled 2018-02-24 (×3): qty 2

## 2018-02-24 MED ORDER — PNEUMOCOCCAL VAC POLYVALENT 25 MCG/0.5ML IJ INJ
0.5000 mL | INJECTION | INTRAMUSCULAR | Status: AC
  Administered 2018-02-26: 0.5 mL via INTRAMUSCULAR
  Filled 2018-02-24: qty 0.5

## 2018-02-24 NOTE — Progress Notes (Addendum)
PROGRESS NOTE    Trevor Kelley  QMG:[id number] DOB: [birth year] DOA: 02/23/2018 PCP: Mauricia Area, MD   Brief Narrative: Trevor Kelley 67 y.o with history of panhypopituitarism, DM-2, hypertension, seizure disorder, CKD stage III presented with sepsis of unknown etiology along with acute kidney injury.  Improving with supportive care-see below for further details.   Assessment & Plan: Sepsis: Sepsis physiology has improved-no foci of infection evident yet, CT scan of the right shoulder does not show any infection.  Blood culture negative so far-stop vancomycin, continue cefepime pending further results.  Continue stress dose steroids but start tapering-continue IV fluids.  AKI on stage III chronic kidney disease: Likely hemodynamically mediated in the setting of sepsis.  Creatinine downtrending but not yet back to baseline.  Follow renal function.  Avoid nephrotoxic agents, renal ultrasound negative for hydronephrosis.  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  Dieabetes mellitus type 2 in obese: CBG stable-continue sliding scale-and follow.    HTN: Blood pressure improved-continue to hold all antihypertensives-we will resume when able.  Hyperlipidemia: continue simvastatin  OSA: continue on CPAP at night.  DVT prophylaxis:  Heparin  Code Status:  Full  Family Communication:  No family present at bed side  Disposition Plan:  Home over the next 2-3 days  Consultants:  None  Procedures:  None  Antimicrobials: Vancomycin 10/17>>10/18 Cefepime 10/17>>   Subjective: Patient reported that his shoulder pain is getting better. No issues overnight. Denied chest pan or SOB.  Has no obvious erythema in the lower extremities-claims skin in his lower extremities is of usual color.  Denies any pain in the lower  extremities.  Objective: Vitals:   02/23/18 2049 02/23/18 2100 02/24/18 0100 02/24/18 0424  BP:  96/65 96/75   Pulse:  69 79   Resp:  17 (!) 22   Temp: 98.1 F (36.7 C)   98.8 F (37.1 C)  TempSrc: Oral   Oral  SpO2:  98% 96%   Weight:      Height:        Intake/Output Summary (Last 24 hours) at 02/24/2018 0901 Last data filed at 02/24/2018 0825 Gross per 24 hour  Intake 6377.84 ml  Output 1650 ml  Net 4727.84 ml   Filed Weights   02/23/18 1119  Weight: 104.3 kg    Examination:  General exam: Appears calm and comfortable  Respiratory system: Clear to auscultation. Respiratory effort normal. Cardiovascular system: S1 & S2 heard, RRR. No JVD, murmurs, rubs, gallops or clicks. No pedal edema. Gastrointestinal system: Abdomen is nondistended, soft and nontender. No organomegaly or masses felt. Normal bowel sounds heard. Central nervous system: Alert and oriented. No focal neurological deficits. Extremities: Symmetric 5 x 5 power. Skin: No rashes, lesions or ulcers Psychiatry: Judgement and insight appear normal. Mood & affect appropriate.   Data Reviewed:  CBC: Recent Labs  Lab 02/23/18 1129 02/24/18 0312  WBC 18.7* 15.1*  NEUTROABS 14.0*  --   HGB 11.2* 9.7*  HCT 36.7* 30.2*  MCV 79.8* 77.8*  PLT 229 326   Basic Metabolic Panel: Recent Labs  Lab 02/23/18 1150 02/24/18 0312  NA 136 136  K 5.0 4.7  CL 104 110  CO2 19* 17*  GLUCOSE 99 134*  BUN 66* 58*  CREATININE 4.12* 3.54*  CALCIUM 9.2 8.6*   GFR: Estimated Creatinine Clearance: 24.6 mL/min (A) (by C-G formula based on SCr of  3.54 mg/dL (H)). Liver Function Tests: Recent Labs  Lab 02/23/18 1150 02/24/18 0312  AST 23 18  ALT 22 18  ALKPHOS 49 39  BILITOT 0.6 0.5  PROT 7.5 6.5  ALBUMIN 3.6 2.9*   No results for input(s): LIPASE, AMYLASE in the last 168 hours. No results for input(s): AMMONIA in the last 168 hours. Coagulation Profile: Recent Labs  Lab 02/23/18 1129  INR 1.21    Cardiac Enzymes: No results for input(s): CKTOTAL, CKMB, CKMBINDEX, TROPONINI in the last 168 hours. BNP (last 3 results) No results for input(s): PROBNP in the last 8760 hours. HbA1C: No results for input(s): HGBA1C in the last 72 hours. CBG: Recent Labs  Lab 02/23/18 2125 02/24/18 0823  GLUCAP 137* 117*   Lipid Profile: No results for input(s): CHOL, HDL, LDLCALC, TRIG, CHOLHDL, LDLDIRECT in the last 72 hours. Thyroid Function Tests: No results for input(s): TSH, T4TOTAL, FREET4, T3FREE, THYROIDAB in the last 72 hours. Anemia Panel: No results for input(s): VITAMINB12, FOLATE, FERRITIN, TIBC, IRON, RETICCTPCT in the last 72 hours. Urine analysis:    Component Value Date/Time   COLORURINE YELLOW 02/23/2018 1406   APPEARANCEUR CLEAR 02/23/2018 1406   LABSPEC 1.011 02/23/2018 1406   PHURINE 5.0 02/23/2018 1406   GLUCOSEU NEGATIVE 02/23/2018 1406   [place] 02/23/2018 1406   [place] 02/23/2018 1406   [place] 02/23/2018 1406   PROTEINUR NEGATIVE 02/23/2018 1406   NITRITE NEGATIVE 02/23/2018 1406   LEUKOCYTESUR MODERATE (A) 02/23/2018 1406   Sepsis Labs: @LABRCNTIP (procalcitonin:4,lacticidven:4)  ) Recent Results (from the past 240 hour(s))  Culture, blood (Routine x 2)     Status: None (Preliminary result)   Collection Time: 02/23/18 11:29 AM  Result Value Ref Range Status   Specimen Description BLOOD RIGHT ANTECUBITAL  Final   Special Requests   Final    BOTTLES DRAWN AEROBIC AND ANAEROBIC Blood Culture adequate volume   Culture   Final    NO GROWTH < 24 HOURS Performed at [facility], [place] [address], [place], [place] [address]    Report Status PENDING  Incomplete  Culture, blood (Routine x 2)     Status: None (Preliminary result)   Collection Time: 02/23/18 11:34 AM  Result Value Ref Range Status   Specimen Description BLOOD RIGHT WRIST  Final   Special Requests   Final    BOTTLES DRAWN AEROBIC AND ANAEROBIC Blood Culture  adequate volume   Culture   Final    NO GROWTH < 24 HOURS Performed at [facility], [place] [address], [place], [place] [address]    Report Status PENDING  Incomplete         Radiology Studies: Ct Shoulder Right Wo Contrast  Result Date: 02/23/2018 CLINICAL DATA:  Intermittent aching in the right shoulder question shoulder infection EXAM: CT OF THE UPPER RIGHT EXTREMITY WITHOUT CONTRAST TECHNIQUE: Multidetector CT imaging of the upper right extremity was performed according to the standard protocol. COMPARISON:  None. FINDINGS: Bones/Joint/Cartilage Osteoarthritis of the [employer] and glenohumeral joints. No joint effusion or frank bone destruction. No fracture identified about the right shoulder. Ligaments Suboptimally assessed by CT. Muscles and Tendons Muscle edema, hemorrhage, mass or atrophy. Biceps tendon is seated within its biceps groove. 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 No evidence of septic arthritis, osteomyelitis or fracture. Electronically Signed   By: Ashley Royalty M.D.   On: 02/23/2018 17:19   Dg Chest Port 1 View  Result Date: 02/23/2018 CLINICAL  DATA:  67 year old male with chills and body ache for 2 days. Fever of 100 F. Sepsis. EXAM: PORTABLE CHEST 1 VIEW COMPARISON:  01/01/2017 chest radiographs. FINDINGS: Portable AP upright view at 1150 hours. Stable tortuosity of the thoracic aorta. Other mediastinal contours are within normal limits. Visualized tracheal air column is within normal limits. Allowing for portable technique the lungs are clear. No pneumothorax or pleural effusion. IMPRESSION: Negative.  No cardiopulmonary abnormality. Electronically Signed   By: Genevie Ann M.D.   On: 02/23/2018 12:17        Scheduled Meds: . calcitRIOL  0.25 mcg Oral Daily  . heparin  5,000 Units Subcutaneous Q8H  . hydrocortisone sod succinate (SOLU-CORTEF) inj  100 mg Intravenous Q8H  . [START ON 02/25/2018] Influenza vac split  quadrivalent PF  0.5 mL Intramuscular Tomorrow-1000  . insulin aspart  0-9 Units Subcutaneous TID WC  . latanoprost  1 drop Both Eyes QHS  . levETIRAcetam  500 mg Oral BID  . levothyroxine  88 mcg Oral QAC breakfast  . pantoprazole  40 mg Oral Q1200  . [START ON 02/25/2018] pneumococcal 23 valent vaccine  0.5 mL Intramuscular Tomorrow-1000  . simvastatin  20 mg Oral Daily  . timolol  1 drop Both Eyes BID  . vancomycin variable dose per unstable renal function (pharmacist dosing)   Does not apply See admin instructions   Continuous Infusions: . ceFEPime (MAXIPIME) IV 1 g (02/23/18 2225)  . lactated ringers 125 mL/hr at 02/24/18 0206     LOS: 1 day    Time spent:25 min    Nena Alexander MD [facility]   If 7PM-7AM, please contact night-coverage [email or web address] Password [email or web address] 02/24/2018, 9:01 AM

## 2018-02-25 LAB — BASIC METABOLIC PANEL
ANION GAP: 7 (ref 5–15)
BUN: 58 mg/dL — ABNORMAL HIGH (ref 8–23)
CALCIUM: 8.7 mg/dL — AB (ref 8.9–10.3)
CO2: 20 mmol/L — ABNORMAL LOW (ref 22–32)
CREATININE: 2.97 mg/dL — AB (ref 0.61–1.24)
Chloride: 111 mmol/L (ref 98–111)
GFR, EST AFRICAN AMERICAN: 23 mL/min — AB (ref >60)
GFR, EST NON AFRICAN AMERICAN: 20 mL/min — AB (ref >60)
Glucose, Bld: 147 mg/dL — ABNORMAL HIGH (ref 70–99)
Potassium: 5 mmol/L (ref 3.5–5.1)
Sodium: 138 mmol/L (ref 135–145)

## 2018-02-25 LAB — GLUCOSE, CAPILLARY
GLUCOSE-CAPILLARY: 128 mg/dL — AB (ref 70–99)
GLUCOSE-CAPILLARY: 132 mg/dL — AB (ref 70–99)
GLUCOSE-CAPILLARY: 141 mg/dL — AB (ref 70–99)
Glucose-Capillary: 119 mg/dL — ABNORMAL HIGH (ref 70–99)

## 2018-02-25 LAB — CBC
HCT: 28.7 % — ABNORMAL LOW (ref 39.0–52.0)
Hemoglobin: 8.7 g/dL — ABNORMAL LOW (ref 13.0–17.0)
MCH: 24.7 pg — AB (ref 26.0–34.0)
MCHC: 30.3 g/dL (ref 30.0–36.0)
MCV: 81.5 fL (ref 80.0–100.0)
NRBC: 0 % (ref 0.0–0.2)
PLATELETS: 196 10*3/uL (ref 150–400)
RBC: 3.52 MIL/uL — AB (ref 4.22–5.81)
RDW: 14.6 % (ref 11.5–15.5)
WBC: 14.1 10*3/uL — ABNORMAL HIGH (ref 4.0–10.5)

## 2018-02-25 MED ORDER — HYDROCORTISONE NA SUCCINATE PF 100 MG IJ SOLR: 50.0000 mg | Freq: Three times a day (TID) | INTRAMUSCULAR | Status: DC

## 2018-02-25 MED ORDER — HYDROCORTISONE NA SUCCINATE PF 100 MG IJ SOLR
25.0000 mg | Freq: Three times a day (TID) | INTRAMUSCULAR | Status: DC
  Administered 2018-02-25 – 2018-02-26 (×3): 25 mg via INTRAVENOUS
  Filled 2018-02-25 (×3): qty 2

## 2018-02-25 NOTE — Progress Notes (Signed)
PROGRESS NOTE    Trevor Kelley  OEU:[id number] DOB: 06/10/1950 DOA: 02/23/2018 PCP: Mauricia Area, MD   Brief Narrative: Trevor Kelley 67 y.o with history of panhypopituitarism, DM-2, hypertension, seizure disorder, CKD stage III presented with sepsis of unknown etiology along with acute kidney injury.  Improving with supportive care-see below for further details.  Subjective: Patient denies any chest pain or shortness of breath-feels much better.  Assessment & Plan: Sepsis: Sepsis physiology has resolved, markedly improved with empiric antimicrobial therapy.  No foci of infection apparent by exam or history-blood culture negative so far.  CT scan of the right shoulder does not show any infection.  Stop all IV fluids-continue IV Rocephin for another day-continue hydrocortisone but taper further.  AKI on stage III chronic kidney disease: Likely hemodynamically mediated in the setting of sepsis-volume status is stable-patient is euvolemic-stop IV fluids and repeat renal function tomorrow.  Continue to avoid nephrotoxic agents-renal ultrasound negative for hydronephrosis.    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    Diabetes mellitus type 2 in obese: CBG stable-continue SSI.  HTN: BP stable-no further high hypotensive episodes-continue to hold all antihypertensives for now.    Hyperlipidemia: continue simvastatin  OSA: continue on CPAP at night.  DVT prophylaxis:  Heparin  Code Status:  Full  Family Communication:  No family present at bed side  Disposition Plan:  Home hopefully tomorrow.  Consultants:  None  Procedures:  None  Antimicrobials: Vancomycin 10/17>>10/18 Cefepime 10/17>>  Objective: Vitals:   02/24/18 0954 02/24/18 2203 02/25/18 0545 02/25/18 1303  BP: 103/74 108/61 113/72 112/76  Pulse: 86 69 (!) 58  (!) 52  Resp: 14 20 18 18   Temp:  99.4 F (37.4 C) 98.8 F (37.1 C) 97.8 F (36.6 C)  TempSrc:  Oral Oral   SpO2: 97% 98% 98% 100%  Weight:      Height:        Intake/Output Summary (Last 24 hours) at 02/25/2018 1311 Last data filed at 02/25/2018 1100 Gross per 24 hour  Intake 500 ml  Output 625 ml  Net -125 ml   Filed Weights   02/23/18 1119  Weight: 104.3 kg    Examination: General appearance:Awake, alert, not in any distress.  Eyes:no scleral icterus. HEENT: Atraumatic and Normocephalic Neck: supple, no JVD. Resp:Good air entry bilaterally,no rales or rhonchi CVS: S1 S2 regular, no murmurs.  GI: Bowel sounds present, Non tender and not distended with no gaurding, rigidity or rebound. Extremities: B/L Lower Ext shows no edema, both legs are warm to touch Neurology:  Non focal Psychiatric: Normal judgment and insight. Normal mood. Musculoskeletal:No digital cyanosis Skin:No Rash, warm and dry Wounds:N/A  Data Reviewed:  CBC: Recent Labs  Lab 02/23/18 1129 02/24/18 0312 02/25/18 0504  WBC 18.7* 15.1* 14.1*  NEUTROABS 14.0*  --   --   HGB 11.2* 9.7* 8.7*  HCT 36.7* 30.2* 28.7*  MCV 79.8* 77.8* 81.5  PLT 229 198 154   Basic Metabolic Panel: Recent Labs  Lab 02/23/18 1150 02/24/18 0312 02/25/18 0504  NA 136 136 138  K 5.0 4.7 5.0  CL 104 110 111  CO2 19* 17* 20*  GLUCOSE 99 134* 147*  BUN 66* 58* 58*  CREATININE 4.12* 3.54* 2.97*  CALCIUM 9.2 8.6* 8.7*   GFR: Estimated Creatinine Clearance: 29.3 mL/min (A) (by C-G formula based on SCr of 2.97 mg/dL (H)). Liver Function Tests: Recent Labs  Lab 02/23/18 1150 02/24/18 0312  AST 23 18  ALT 22 18  ALKPHOS 49 39  BILITOT 0.6 0.5  PROT 7.5 6.5  ALBUMIN 3.6 2.9*   No results for input(s): LIPASE, AMYLASE in the last 168 hours. No results for input(s): AMMONIA in the last 168 hours. Coagulation Profile: Recent Labs  Lab 02/23/18 1129  INR 1.21   Cardiac Enzymes: No results for input(s):  CKTOTAL, CKMB, CKMBINDEX, TROPONINI in the last 168 hours. BNP (last 3 results) No results for input(s): PROBNP in the last 8760 hours. HbA1C: No results for input(s): HGBA1C in the last 72 hours. CBG: Recent Labs  Lab 02/24/18 1240 02/24/18 1644 02/24/18 2159 02/25/18 0754 02/25/18 1228  GLUCAP 163* 171* 152* 128* 119*   Lipid Profile: No results for input(s): CHOL, HDL, LDLCALC, TRIG, CHOLHDL, LDLDIRECT in the last 72 hours. Thyroid Function Tests: No results for input(s): TSH, T4TOTAL, FREET4, T3FREE, THYROIDAB in the last 72 hours. Anemia Panel: No results for input(s): VITAMINB12, FOLATE, FERRITIN, TIBC, IRON, RETICCTPCT in the last 72 hours. Urine analysis:    Component Value Date/Time   COLORURINE YELLOW 02/23/2018 1406   APPEARANCEUR CLEAR 02/23/2018 1406   LABSPEC 1.011 02/23/2018 1406   PHURINE 5.0 02/23/2018 1406   GLUCOSEU NEGATIVE 02/23/2018 1406   [place] 02/23/2018 1406   [place] 02/23/2018 1406   [place] 02/23/2018 1406   PROTEINUR NEGATIVE 02/23/2018 1406   NITRITE NEGATIVE 02/23/2018 1406   LEUKOCYTESUR MODERATE (A) 02/23/2018 1406   Sepsis Labs: @LABRCNTIP (procalcitonin:4,lacticidven:4)  ) Recent Results (from the past 240 hour(s))  Culture, blood (Routine x 2)     Status: None (Preliminary result)   Collection Time: 02/23/18 11:29 AM  Result Value Ref Range Status   Specimen Description BLOOD RIGHT ANTECUBITAL  Final   Special Requests   Final    BOTTLES DRAWN AEROBIC AND ANAEROBIC Blood Culture adequate volume   Culture   Final    NO GROWTH 2 DAYS Performed at [facility], [place] [address] [place], [place] [address]    Report Status PENDING  Incomplete  Culture, blood (Routine x 2)     Status: None (Preliminary result)   Collection Time: 02/23/18 11:34 AM  Result Value Ref Range Status   Specimen Description BLOOD RIGHT WRIST  Final   Special Requests   Final    BOTTLES DRAWN AEROBIC AND ANAEROBIC Blood  Culture adequate volume   Culture   Final    NO GROWTH 2 DAYS Performed at [facility], [address] [place], [place] [address]    Report Status PENDING  Incomplete  MRSA PCR Screening     Status: None   Collection Time: 02/24/18  2:27 AM  Result Value Ref Range Status   MRSA by PCR NEGATIVE NEGATIVE Final    Comment:        The GeneXpert MRSA Assay (FDA approved for NASAL specimens only), is one component of a comprehensive MRSA colonization surveillance program. It is not intended to diagnose MRSA infection nor to guide or monitor treatment for MRSA infections. Performed at [facility], [place] [address] [place], [place] [address]          Radiology Studies: Ct Shoulder Right Wo Contrast  Result Date: 02/23/2018 CLINICAL DATA:  Intermittent aching in the right shoulder question shoulder infection EXAM: CT OF THE UPPER RIGHT EXTREMITY WITHOUT CONTRAST TECHNIQUE: Multidetector CT imaging of the upper right extremity was performed according to the standard protocol. COMPARISON:  None. FINDINGS: Bones/Joint/Cartilage Osteoarthritis of the [facility] and glenohumeral joints. No joint effusion or frank bone destruction. No fracture identified about the right shoulder. Ligaments Suboptimally assessed by CT. Muscles and Tendons Muscle edema, hemorrhage, mass or atrophy. Biceps tendon is seated within its biceps groove. 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 No evidence of septic arthritis, osteomyelitis or fracture. Electronically Signed   By: Ashley Royalty M.D.   On: 02/23/2018 17:19   US Renal  Result Date: 02/24/2018 CLINICAL DATA:  67 year old male with a history of acute kidney injury EXAM: RENAL / URINARY TRACT ULTRASOUND COMPLETE COMPARISON:  None. FINDINGS: Right Kidney: Length: 12.1 cm. No evidence of hydronephrosis. Flow confirmed in the hilum of the right kidney. Anechoic lesion at the superolateral cortex of the right kidney  with through transmission and no internal complexity or flow measures 3.3 cm. Left Kidney: Length: 12.4 cm. No evidence of hydronephrosis. Flow confirmed in the hilum of the left kidney. Anechoic lesion at the inferolateral cortex of the left kidney with through transmission, no internal flow or complexity measures 2.4 cm. Bladder: Prostate tissue with significant impression on the bladder base with otherwise unremarkable bladder. IMPRESSION: No evidence of hydronephrosis. Bilateral renal cysts. Large impression of the bladder base by prostate. Recommend correlation with possible chronic bladder outlet obstruction. Electronically Signed   By: Corrie Mckusick D.O.   On: 02/24/2018 09:16        Scheduled Meds: . calcitRIOL  0.25 mcg Oral Daily  . heparin  5,000 Units Subcutaneous Q8H  . hydrocortisone sod succinate (SOLU-CORTEF) inj  50 mg Intravenous Q8H  . insulin aspart  0-9 Units Subcutaneous TID WC  . latanoprost  1 drop Both Eyes QHS  . levETIRAcetam  500 mg Oral BID  . levothyroxine  88 mcg Oral QAC breakfast  . pantoprazole  40 mg Oral Q1200  . pneumococcal 23 valent vaccine  0.5 mL Intramuscular Tomorrow-1000  . simvastatin  20 mg Oral Daily  . timolol  1 drop Both Eyes BID   Continuous Infusions: . ceFEPime (MAXIPIME) IV 1 g (02/24/18 2232)  . lactated ringers 100 mL/hr at 02/25/18 1045     LOS: 2 days    Time spent:25 min    Nena Alexander MD [facility]   If 7PM-7AM, please contact night-coverage [email or web address] Password [email or web address] 02/25/2018, 1:11 PM

## 2018-02-25 NOTE — Progress Notes (Signed)
Placed pt on dream station with EPAP of 10, added sterile water and fitted L mask to pt, 21% FIO2. Pt tolerating well and educated pt on mask and machine to place self on and off if needed to.

## 2018-02-26 DIAGNOSIS — E2749 Other adrenocortical insufficiency: Secondary | ICD-10-CM

## 2018-02-26 LAB — BASIC METABOLIC PANEL
Anion gap: 5 (ref 5–15)
BUN: 51 mg/dL — AB (ref 8–23)
CALCIUM: 8.5 mg/dL — AB (ref 8.9–10.3)
CHLORIDE: 113 mmol/L — AB (ref 98–111)
CO2: 21 mmol/L — AB (ref 22–32)
CREATININE: 2.48 mg/dL — AB (ref 0.61–1.24)
GFR calc non Af Amer: 25 mL/min — ABNORMAL LOW (ref >60)
GFR, EST AFRICAN AMERICAN: 29 mL/min — AB (ref >60)
GLUCOSE: 112 mg/dL — AB (ref 70–99)
Potassium: 4.7 mmol/L (ref 3.5–5.1)
Sodium: 139 mmol/L (ref 135–145)

## 2018-02-26 LAB — GLUCOSE, CAPILLARY
GLUCOSE-CAPILLARY: 106 mg/dL — AB (ref 70–99)
Glucose-Capillary: 101 mg/dL — ABNORMAL HIGH (ref 70–99)

## 2018-02-26 MED ORDER — HYDROCORTISONE 5 MG PO TABS: 15.0000 mg | ORAL_TABLET | Freq: Every day | ORAL | Status: DC

## 2018-02-26 MED ORDER — HYDROCORTISONE 5 MG PO TABS
15.0000 mg | ORAL_TABLET | Freq: Every day | ORAL | Status: DC
  Administered 2018-02-26: 15 mg via ORAL
  Filled 2018-02-26: qty 1

## 2018-02-26 MED ORDER — AMOXICILLIN-POT CLAVULANATE 500-125 MG PO TABS: 1.0000 | ORAL_TABLET | Freq: Three times a day (TID) | ORAL | 0 refills | Status: DC

## 2018-02-26 MED ORDER — HYDROCORTISONE 10 MG PO TABS
10.0000 mg | ORAL_TABLET | Freq: Every day | ORAL | Status: DC
  Filled 2018-02-26: qty 1

## 2018-02-26 NOTE — Progress Notes (Signed)
Placed pt on dream station with EPAP of 10, added sterile water and fitted L mask to pt, 21% FIO2. Pt tolerating well

## 2018-02-26 NOTE — Progress Notes (Signed)
Trevor Kelley to be D/C'd Home per MD order.  Discussed with the patient and all questions fully answered.  VSS, Skin clean, dry and intact without evidence of skin break down, no evidence of skin tears noted. IV catheter discontinued intact. Site without signs and symptoms of complications. Dressing and pressure applied.  An After Visit Summary was printed and given to the patient. Patient received prescription.  D/c education completed with patient/family including follow up instructions, medication list, d/c activities limitations if indicated, with other d/c instructions as indicated by MD - patient able to verbalize understanding, all questions fully answered.   Patient instructed to return to ED, call 911, or call MD for any changes in condition.   Patient escorted via [place], and D/C home via private auto.  [name] 02/26/2018 11:30 AM

## 2018-02-26 NOTE — Discharge Summary (Signed)
PATIENT DETAILS Name: Trevor Kelley Age: 67 y.o. Sex: male Date of Birth: 09/12/1950 MRN: [id number]. Admitting Physician: Jonetta Osgood, MD SAY:[id number], Jeneen Rinks, MD  Admit Date: 02/23/2018 Discharge date: 02/26/2018  Recommendations for Outpatient Follow-up:  1. Follow up with PCP in 1-2 weeks 2. Please obtain BMP/CBC in 2-3 days 3. Follow blood cultures until final 4. Resuming amlodipine on discharge-holding Coreg and diuretics for another few more days-please resume if renal function continues to improve.  Admitted From:  Home  Disposition: [place]: No  Equipment/Devices: None  Discharge Condition: Stable  CODE STATUS: FULL CODE  Diet recommendation:  Heart Healthy / Carb Modified  Brief Summary: See H&P, Labs, Consult and Test reports for all details in brief, Trevor Kelley 67 y.o with history of panhypopituitarism, DM-2, hypertension, seizure disorder, CKD stage III presented with sepsis of unknown etiology along with acute kidney injury.    Markedly improved with supportive care-empiric antibiotics.  See below for further details.  Brief Hospital Course: Sepsis: Sepsis physiology has resolved, markedly improved with empiric antimicrobial therapy.  No foci of infection apparent by exam or history-blood culture negative so far.  CT scan of the right shoulder does not show any infection.    Patient was initially on broad-spectrum antimicrobial therapy with vancomycin and cefepime-all cultures were negative-vancomycin was discontinued a few days back.  There continues to be no obvious source of infection at this time-patient has markedly improved with no fever for more than 48 hours-renal function is improving rapidly as well.  Patient is immunocompromised-is on chronic steroids for panhypopituitarism-hence will switch to oral Augmentin on discharge for a few more days to complete a 7-day course of empiric antimicrobial therapy.    AKI on  stage III chronic kidney disease: Likely hemodynamically mediated in the setting of sepsis-volume status is stable-patient is euvolemic-all IV fluids were stopped on 10/19-renal function continues to improve without IV fluids.  For now continue to hold all diuretics-patient instructed to follow with his primary care practitioner in the next 2-3 days before resuming diuretic regimen.    Seizure disorder: Continue Keppra  Panhypopituitarism: Was started on stress dose hydrocortisone-this was tapered down-by day of discharge he has been transitioned to his usual dosing of hydrocortisone.  Continue Synthroid.  Diabetes mellitus type 2 in obese: CBG stable-managed with SSI during this hospital stay-diet controlled at home.  HTN:  BP stable-slowly creeping up-was hypotensive on initial presentation-hence all antihypertensives were held.  Will resume amlodipine on discharge-we will continue to hold diuretics and Coreg until seen by PCP.  Patient instructed to follow with PCP in the next 2-3 days.    Hyperlipidemia: continue simvastatin  OSA: continue on CPAP at night.  Procedures/Studies: None  Discharge Diagnoses:  Principal Problem:   Sepsis ([place]) Active Problems:   Diabetes mellitus type 2 in obese (HCC)   OSA (obstructive sleep apnea)   Pituitary adenoma (HCC)   Secondary adrenal insufficiency (HCC)   Secondary hypothyroidism   AKI (acute kidney injury) ([place])   Seizure disorder [facility])   Discharge Instructions:  Activity:  As tolerated  Discharge Instructions    Call MD for:  extreme fatigue   Complete by:  As directed    Call MD for:  persistant dizziness or light-headedness   Complete by:  As directed    Call MD for:  persistant nausea and vomiting   Complete by:  As directed    Call MD for:  severe uncontrolled pain  Complete by:  As directed    Diet - low sodium heart healthy   Complete by:  As directed    Discharge instructions   Complete by:  As directed     Follow with your Primary Care MD in 1 week  Follow with Deterding, Jeneen Rinks, MD in week  Some of your fluid pills/blood pressure pills are on hold-please follow with your primary care MD in next 2-3 days, and resume accordingly  Your kidney function is improving rapidly-please ask your primary care MD to recheck blood work at next visit.  Please get a complete blood count and chemistry panel checked by your Primary MD at your next visit, and again as instructed by your Primary MD.  Get Medicines reviewed and adjusted: Please take all your medications with you for your next visit with your Primary MD  Laboratory/radiological data: Please request your Primary MD to go over all hospital tests and procedure/radiological results at the follow up, please ask your Primary MD to get all Hospital records sent to his/her office.  In some cases, they will be blood work, cultures and biopsy results pending at the time of your discharge. Please request that your primary care M.D. follows up on these results.  Also Note the following: If you experience worsening of your admission symptoms, develop shortness of breath, life threatening emergency, suicidal or homicidal thoughts you must seek medical attention immediately by calling 911 or calling your MD immediately  if symptoms less severe.  You must read complete instructions/literature along with all the possible adverse reactions/side effects for all the Medicines you take and that have been prescribed to you. Take any new Medicines after you have completely understood and accpet all the possible adverse reactions/side effects.   Do not drive when taking Pain medications or sleeping medications (Benzodaizepines)  Do not take more than prescribed Pain, Sleep and Anxiety Medications. It is not advisable to combine anxiety,sleep and pain medications without talking with your primary care practitioner  Special Instructions: If you have smoked or chewed  Tobacco  in the last 2 yrs please stop smoking, stop any regular Alcohol  and or any Recreational drug use.  Wear Seat belts while driving.  Please note: You were cared for by a hospitalist during your hospital stay. Once you are discharged, your primary care physician will handle any further medical issues. Please note that NO REFILLS for any discharge medications will be authorized once you are discharged, as it is imperative that you return to your primary care physician (or establish a relationship with a primary care physician if you do not have one) for your post hospital discharge needs so that they can reassess your need for medications and monitor your lab values.   Increase activity slowly   Complete by:  As directed      Allergies as of 02/26/2018   No Known Allergies     Medication List    STOP taking these medications   carvedilol 6.25 MG tablet Commonly known as:  COREG   furosemide 80 MG tablet Commonly known as:  LASIX   potassium chloride SA 20 MEQ tablet Commonly known as:  K-DUR,KLOR-CON   spironolactone 25 MG tablet Commonly known as:  ALDACTONE     TAKE these medications   amLODipine 10 MG tablet Commonly known as:  NORVASC Take 10 mg by mouth 2 (two) times daily.   amoxicillin-clavulanate 500-125 MG tablet Commonly known as:  AUGMENTIN Take 1 tablet (500 mg total) by mouth  3 (three) times daily.   calcitRIOL 0.25 MCG capsule Commonly known as:  ROCALTROL Take 0.25 mcg by mouth daily.   hydrocortisone 10 MG tablet Commonly known as:  CORTEF Take 10-15 mg by mouth See admin instructions. Take 15 mg  tablets every morning and take 10 mg tablet daily   levETIRAcetam 500 MG tablet Commonly known as:  KEPPRA Take 1 tablet (500 mg total) by mouth 2 (two) times daily.   levothyroxine 88 MCG tablet Commonly known as:  SYNTHROID, LEVOTHROID Take 88 mcg by mouth daily before breakfast.   LUMIGAN 0.01 % Soln Generic drug:  bimatoprost Place 1 drop  into both eyes at bedtime.   simvastatin 20 MG tablet Commonly known as:  ZOCOR Take 20 mg by mouth daily at 6 PM.   tamsulosin 0.4 MG Caps capsule Commonly known as:  FLOMAX Take 0.4 mg by mouth daily.   timolol 0.5 % ophthalmic solution Commonly known as:  TIMOPTIC Place 1 drop into both eyes daily.      Follow-up Information    Deterding, Jeneen Rinks, MD. Schedule an appointment as soon as possible for a visit in 1 week(s).   Specialty:  Nephrology Contact information: [place] [address] [phone]        Primary Care MD. Go to.   Why:  IN 2-3 DAYS         No Known Allergies  Consultations:   None   Other Procedures/Studies: Ct Shoulder Right Wo Contrast  Result Date: 02/23/2018 CLINICAL DATA:  Intermittent aching in the right shoulder question shoulder infection EXAM: CT OF THE UPPER RIGHT EXTREMITY WITHOUT CONTRAST TECHNIQUE: Multidetector CT imaging of the upper right extremity was performed according to the standard protocol. COMPARISON:  None. FINDINGS: Bones/Joint/Cartilage Osteoarthritis of the [facility] and glenohumeral joints. No joint effusion or frank bone destruction. No fracture identified about the right shoulder. Ligaments Suboptimally assessed by CT. Muscles and Tendons Muscle edema, hemorrhage, mass or atrophy. Biceps tendon is seated within its biceps groove. 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 No evidence of septic arthritis, osteomyelitis or fracture. Electronically Signed   By: Ashley Royalty M.D.   On: 02/23/2018 17:19   US Renal  Result Date: 02/24/2018 CLINICAL DATA:  67 year old male with a history of acute kidney injury EXAM: RENAL / URINARY TRACT ULTRASOUND COMPLETE COMPARISON:  None. FINDINGS: Right Kidney: Length: 12.1 cm. No evidence of hydronephrosis. Flow confirmed in the hilum of the right kidney. Anechoic lesion at the superolateral cortex of the right kidney with through  transmission and no internal complexity or flow measures 3.3 cm. Left Kidney: Length: 12.4 cm. No evidence of hydronephrosis. Flow confirmed in the hilum of the left kidney. Anechoic lesion at the inferolateral cortex of the left kidney with through transmission, no internal flow or complexity measures 2.4 cm. Bladder: Prostate tissue with significant impression on the bladder base with otherwise unremarkable bladder. IMPRESSION: No evidence of hydronephrosis. Bilateral renal cysts. Large impression of the bladder base by prostate. Recommend correlation with possible chronic bladder outlet obstruction. Electronically Signed   By: Corrie Mckusick D.O.   On: 02/24/2018 09:16   Dg Chest Port 1 View  Result Date: 02/23/2018 CLINICAL DATA:  67 year old male with chills and body ache for 2 days. Fever of 100 F. Sepsis. EXAM: PORTABLE CHEST 1 VIEW COMPARISON:  01/01/2017 chest radiographs. FINDINGS: Portable AP upright view at 1150 hours. Stable tortuosity of the thoracic aorta. Other mediastinal contours are within normal  limits. Visualized tracheal air column is within normal limits. Allowing for portable technique the lungs are clear. No pneumothorax or pleural effusion. IMPRESSION: Negative.  No cardiopulmonary abnormality. Electronically Signed   By: Genevie Ann M.D.   On: 02/23/2018 12:17      TODAY-DAY OF DISCHARGE:  Subjective:   Trevor Kelley today has no headache,no chest abdominal pain,no new weakness tingling or numbness, feels much better wants to go home today.   Objective:   Blood pressure (!) 121/54, pulse 64, temperature 97.9 F (36.6 C), temperature source Oral, resp. rate 18, height 6' (1.829 m), weight 104.3 kg, SpO2 95 %.  Intake/Output Summary (Last 24 hours) at 02/26/2018 0845 Last data filed at 02/26/2018 0730 Gross per 24 hour  Intake 360 ml  Output 1550 ml  Net -1190 ml   Filed Weights   02/23/18 1119  Weight: 104.3 kg    Exam: Awake Alert, Oriented *3, No new F.N  deficits, Normal affect [place].AT,PERRAL Supple Neck,No JVD, No cervical lymphadenopathy appriciated.  Symmetrical Chest wall movement, Good air movement bilaterally, CTAB RRR,No Gallops,Rubs or new Murmurs, No Parasternal Heave +ve B.Sounds, Abd Soft, Non tender, No organomegaly appriciated, No rebound -guarding or rigidity. No Cyanosis, Clubbing or edema, No new Rash or bruise   PERTINENT RADIOLOGIC STUDIES: Ct Shoulder Right Wo Contrast  Result Date: 02/23/2018 CLINICAL DATA:  Intermittent aching in the right shoulder question shoulder infection EXAM: CT OF THE UPPER RIGHT EXTREMITY WITHOUT CONTRAST TECHNIQUE: Multidetector CT imaging of the upper right extremity was performed according to the standard protocol. COMPARISON:  None. 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 No joint effusion or frank bone destruction. No fracture identified about the right shoulder. Ligaments Suboptimally assessed by CT. Muscles and Tendons Muscle edema, hemorrhage, mass or atrophy. Biceps tendon is seated within its biceps groove. 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 No evidence of septic arthritis, osteomyelitis or fracture. Electronically Signed   By: Ashley Royalty M.D.   On: 02/23/2018 17:19   US Renal  Result Date: 02/24/2018 CLINICAL DATA:  67 year old male with a history of acute kidney injury EXAM: RENAL / URINARY TRACT ULTRASOUND COMPLETE COMPARISON:  None. FINDINGS: Right Kidney: Length: 12.1 cm. No evidence of hydronephrosis. Flow confirmed in the hilum of the right kidney. Anechoic lesion at the superolateral cortex of the right kidney with through transmission and no internal complexity or flow measures 3.3 cm. Left Kidney: Length: 12.4 cm. No evidence of hydronephrosis. Flow confirmed in the hilum of the left kidney. Anechoic lesion at the inferolateral cortex of the left kidney with through transmission, no internal  flow or complexity measures 2.4 cm. Bladder: Prostate tissue with significant impression on the bladder base with otherwise unremarkable bladder. IMPRESSION: No evidence of hydronephrosis. Bilateral renal cysts. Large impression of the bladder base by prostate. Recommend correlation with possible chronic bladder outlet obstruction. Electronically Signed   By: Corrie Mckusick D.O.   On: 02/24/2018 09:16   Dg Chest Port 1 View  Result Date: 02/23/2018 CLINICAL DATA:  67 year old male with chills and body ache for 2 days. Fever of 100 F. Sepsis. EXAM: PORTABLE CHEST 1 VIEW COMPARISON:  01/01/2017 chest radiographs. FINDINGS: Portable AP upright view at 1150 hours. Stable tortuosity of the thoracic aorta. Other mediastinal contours are within normal limits. Visualized tracheal air column is within normal limits. Allowing for portable technique the lungs are clear. No pneumothorax or pleural effusion. IMPRESSION: Negative.  No cardiopulmonary abnormality. Electronically Signed  By: Genevie Ann M.D.   On: 02/23/2018 12:17     PERTINENT LAB RESULTS: CBC: Recent Labs    02/24/18 0312 02/25/18 0504  WBC 15.1* 14.1*  HGB 9.7* 8.7*  HCT 30.2* 28.7*  PLT 198 196   CMET CMP     Component Value Date/Time   NA 139 02/26/2018 0350   K 4.7 02/26/2018 0350   CL 113 (H) 02/26/2018 0350   CO2 21 (L) 02/26/2018 0350   GLUCOSE 112 (H) 02/26/2018 0350   BUN 51 (H) 02/26/2018 0350   CREATININE 2.48 (H) 02/26/2018 0350   CALCIUM 8.5 (L) 02/26/2018 0350   PROT 6.5 02/24/2018 0312   ALBUMIN 2.9 (L) 02/24/2018 0312   AST 18 02/24/2018 0312   ALT 18 02/24/2018 0312   ALKPHOS 39 02/24/2018 0312   BILITOT 0.5 02/24/2018 0312   GFRNONAA 25 (L) 02/26/2018 0350   GFRAA 29 (L) 02/26/2018 0350    GFR Estimated Creatinine Clearance: 35.1 mL/min (A) (by C-G formula based on SCr of 2.48 mg/dL (H)). No results for input(s): LIPASE, AMYLASE in the last 72 hours. No results for input(s): CKTOTAL, CKMB, CKMBINDEX,  TROPONINI in the last 72 hours. Invalid input(s): POCBNP No results for input(s): DDIMER in the last 72 hours. No results for input(s): HGBA1C in the last 72 hours. No results for input(s): CHOL, HDL, LDLCALC, TRIG, CHOLHDL, LDLDIRECT in the last 72 hours. No results for input(s): TSH, T4TOTAL, T3FREE, THYROIDAB in the last 72 hours.  Invalid input(s): FREET3 No results for input(s): VITAMINB12, FOLATE, FERRITIN, TIBC, IRON, RETICCTPCT in the last 72 hours. Coags: Recent Labs    02/23/18 1129  INR 1.21   Microbiology: Recent Results (from the past 240 hour(s))  Culture, blood (Routine x 2)     Status: None (Preliminary result)   Collection Time: 02/23/18 11:29 AM  Result Value Ref Range Status   Specimen Description BLOOD RIGHT ANTECUBITAL  Final   Special Requests   Final    BOTTLES DRAWN AEROBIC AND ANAEROBIC Blood Culture adequate volume   Culture   Final    NO GROWTH 2 DAYS Performed at [facility], [address], [place], [place] [address]    Report Status PENDING  Incomplete  Culture, blood (Routine x 2)     Status: None (Preliminary result)   Collection Time: 02/23/18 11:34 AM  Result Value Ref Range Status   Specimen Description BLOOD RIGHT WRIST  Final   Special Requests   Final    BOTTLES DRAWN AEROBIC AND ANAEROBIC Blood Culture adequate volume   Culture   Final    NO GROWTH 2 DAYS Performed at [facility], [address], [place], [place] [address]    Report Status PENDING  Incomplete  MRSA PCR Screening     Status: None   Collection Time: 02/24/18  2:27 AM  Result Value Ref Range Status   MRSA by PCR NEGATIVE NEGATIVE Final    Comment:        The GeneXpert MRSA Assay (FDA approved for NASAL specimens only), is one component of a comprehensive MRSA colonization surveillance program. It is not intended to diagnose MRSA infection nor to guide or monitor treatment for MRSA infections. Performed at [facility], [place] [address]  [place], [place] [address]     FURTHER DISCHARGE INSTRUCTIONS:  Get Medicines reviewed and adjusted: Please take all your medications with you for your next visit with your Primary MD  Laboratory/radiological data: Please request  your Primary MD to go over all hospital tests and procedure/radiological results at the follow up, please ask your Primary MD to get all Hospital records sent to his/her office.  In some cases, they will be blood work, cultures and biopsy results pending at the time of your discharge. Please request that your primary care M.D. goes through all the records of your hospital data and follows up on these results.  Also Note the following: If you experience worsening of your admission symptoms, develop shortness of breath, life threatening emergency, suicidal or homicidal thoughts you must seek medical attention immediately by calling 911 or calling your MD immediately  if symptoms less severe.  You must read complete instructions/literature along with all the possible adverse reactions/side effects for all the Medicines you take and that have been prescribed to you. Take any new Medicines after you have completely understood and accpet all the possible adverse reactions/side effects.   Do not drive when taking Pain medications or sleeping medications (Benzodaizepines)  Do not take more than prescribed Pain, Sleep and Anxiety Medications. It is not advisable to combine anxiety,sleep and pain medications without talking with your primary care practitioner  Special Instructions: If you have smoked or chewed Tobacco  in the last 2 yrs please stop smoking, stop any regular Alcohol  and or any Recreational drug use.  Wear Seat belts while driving.  Please note: You were cared for by a hospitalist during your hospital stay. Once you are discharged, your primary care physician will handle any further medical issues. Please note that NO REFILLS for any discharge medications will be  authorized once you are discharged, as it is imperative that you return to your primary care physician (or establish a relationship with a primary care physician if you do not have one) for your post hospital discharge needs so that they can reassess your need for medications and monitor your lab values.  Total Time spent coordinating discharge including counseling, education and face to face time equals 35 minutes.  Signed: Izzac Kelley 02/26/2018 8:45 AM

## 2018-02-28 LAB — CULTURE, BLOOD (ROUTINE X 2)
CULTURE: NO GROWTH
Culture: NO GROWTH
SPECIAL REQUESTS: ADEQUATE
Special Requests: ADEQUATE

## 2018-03-07 DIAGNOSIS — Z09 Encounter for follow-up examination after completed treatment for conditions other than malignant neoplasm: Secondary | ICD-10-CM | POA: Diagnosis not present

## 2018-03-07 DIAGNOSIS — Z86018 Personal history of other benign neoplasm: Secondary | ICD-10-CM | POA: Diagnosis not present

## 2018-03-07 DIAGNOSIS — Z9889 Other specified postprocedural states: Secondary | ICD-10-CM | POA: Diagnosis not present

## 2018-03-07 DIAGNOSIS — Z86011 Personal history of benign neoplasm of the brain: Secondary | ICD-10-CM | POA: Diagnosis not present

## 2018-03-15 DIAGNOSIS — I129 Hypertensive chronic kidney disease with stage 1 through stage 4 chronic kidney disease, or unspecified chronic kidney disease: Secondary | ICD-10-CM | POA: Diagnosis not present

## 2018-03-23 DIAGNOSIS — N189 Chronic kidney disease, unspecified: Secondary | ICD-10-CM | POA: Diagnosis not present

## 2018-03-28 DIAGNOSIS — N2581 Secondary hyperparathyroidism of renal origin: Secondary | ICD-10-CM | POA: Diagnosis not present

## 2018-03-28 DIAGNOSIS — Z0000 Encounter for general adult medical examination without abnormal findings: Secondary | ICD-10-CM | POA: Diagnosis not present

## 2018-03-28 DIAGNOSIS — D631 Anemia in chronic kidney disease: Secondary | ICD-10-CM | POA: Diagnosis not present

## 2018-03-28 DIAGNOSIS — R809 Proteinuria, unspecified: Secondary | ICD-10-CM | POA: Diagnosis not present

## 2018-03-28 DIAGNOSIS — E782 Mixed hyperlipidemia: Secondary | ICD-10-CM | POA: Diagnosis not present

## 2018-03-28 DIAGNOSIS — I129 Hypertensive chronic kidney disease with stage 1 through stage 4 chronic kidney disease, or unspecified chronic kidney disease: Secondary | ICD-10-CM | POA: Diagnosis not present

## 2018-03-28 DIAGNOSIS — E663 Overweight: Secondary | ICD-10-CM | POA: Diagnosis not present

## 2018-03-28 DIAGNOSIS — R319 Hematuria, unspecified: Secondary | ICD-10-CM | POA: Diagnosis not present

## 2018-03-28 DIAGNOSIS — E038 Other specified hypothyroidism: Secondary | ICD-10-CM | POA: Diagnosis not present

## 2018-03-28 DIAGNOSIS — D352 Benign neoplasm of pituitary gland: Secondary | ICD-10-CM | POA: Diagnosis not present

## 2018-03-28 DIAGNOSIS — N529 Male erectile dysfunction, unspecified: Secondary | ICD-10-CM | POA: Diagnosis not present

## 2018-03-28 DIAGNOSIS — N183 Chronic kidney disease, stage 3 (moderate): Secondary | ICD-10-CM | POA: Diagnosis not present

## 2018-04-19 DIAGNOSIS — I129 Hypertensive chronic kidney disease with stage 1 through stage 4 chronic kidney disease, or unspecified chronic kidney disease: Secondary | ICD-10-CM | POA: Diagnosis not present

## 2018-04-19 DIAGNOSIS — N183 Chronic kidney disease, stage 3 (moderate): Secondary | ICD-10-CM | POA: Diagnosis not present

## 2018-05-17 DIAGNOSIS — E230 Hypopituitarism: Secondary | ICD-10-CM | POA: Diagnosis not present

## 2018-05-17 DIAGNOSIS — E1169 Type 2 diabetes mellitus with other specified complication: Secondary | ICD-10-CM | POA: Diagnosis not present

## 2018-05-17 DIAGNOSIS — E669 Obesity, unspecified: Secondary | ICD-10-CM | POA: Diagnosis not present

## 2018-05-17 DIAGNOSIS — E2749 Other adrenocortical insufficiency: Secondary | ICD-10-CM | POA: Diagnosis not present

## 2018-05-17 DIAGNOSIS — I10 Essential (primary) hypertension: Secondary | ICD-10-CM | POA: Diagnosis not present

## 2018-05-18 DIAGNOSIS — N183 Chronic kidney disease, stage 3 (moderate): Secondary | ICD-10-CM | POA: Diagnosis not present

## 2018-05-27 ENCOUNTER — Other Ambulatory Visit: Payer: Self-pay | Admitting: Neurology

## 2018-06-02 DIAGNOSIS — N183 Chronic kidney disease, stage 3 (moderate): Secondary | ICD-10-CM | POA: Diagnosis not present

## 2018-06-06 ENCOUNTER — Other Ambulatory Visit: Payer: Self-pay | Admitting: *Deleted

## 2018-06-06 DIAGNOSIS — E038 Other specified hypothyroidism: Secondary | ICD-10-CM | POA: Diagnosis not present

## 2018-06-06 DIAGNOSIS — R809 Proteinuria, unspecified: Secondary | ICD-10-CM | POA: Diagnosis not present

## 2018-06-06 DIAGNOSIS — I129 Hypertensive chronic kidney disease with stage 1 through stage 4 chronic kidney disease, or unspecified chronic kidney disease: Secondary | ICD-10-CM | POA: Diagnosis not present

## 2018-06-06 DIAGNOSIS — N2581 Secondary hyperparathyroidism of renal origin: Secondary | ICD-10-CM | POA: Diagnosis not present

## 2018-06-06 DIAGNOSIS — R319 Hematuria, unspecified: Secondary | ICD-10-CM | POA: Diagnosis not present

## 2018-06-06 DIAGNOSIS — D352 Benign neoplasm of pituitary gland: Secondary | ICD-10-CM | POA: Diagnosis not present

## 2018-06-06 DIAGNOSIS — D631 Anemia in chronic kidney disease: Secondary | ICD-10-CM | POA: Diagnosis not present

## 2018-06-06 DIAGNOSIS — E782 Mixed hyperlipidemia: Secondary | ICD-10-CM | POA: Diagnosis not present

## 2018-06-06 DIAGNOSIS — E663 Overweight: Secondary | ICD-10-CM | POA: Diagnosis not present

## 2018-06-06 DIAGNOSIS — N183 Chronic kidney disease, stage 3 (moderate): Secondary | ICD-10-CM | POA: Diagnosis not present

## 2018-06-06 DIAGNOSIS — N529 Male erectile dysfunction, unspecified: Secondary | ICD-10-CM | POA: Diagnosis not present

## 2018-06-06 DIAGNOSIS — E2749 Other adrenocortical insufficiency: Secondary | ICD-10-CM | POA: Diagnosis not present

## 2018-06-06 NOTE — Telephone Encounter (Signed)
Called patient and informed him his FU tomorrow needs to be rescheduled. He agreed to reschedule with Maryjean Ka, NP for Dr Jannifer Franklin. I rescheduled, gave him date/time. He then asked if he could have a printed Rx for levetiracetam. He stated he can get is for 50% less through an app on his phone, but he has to have printed Rx to take to pharmacy.  I advised him it was refilled to [facility] on 05/29/18. He stated it costs too much. I advised him a printed RX should be able to be done. He would like a call back when it is ready to be picked up at front desk. I advised him I will call him.  Patient verbalized understanding, appreciation.

## 2018-06-07 ENCOUNTER — Ambulatory Visit: Payer: Medicare Other | Admitting: Adult Health

## 2018-06-07 MED ORDER — LEVETIRACETAM 500 MG PO TABS: 500.0000 mg | ORAL_TABLET | Freq: Two times a day (BID) | ORAL | 3 refills | Status: DC

## 2018-06-07 NOTE — Telephone Encounter (Addendum)
Per patient's request and NP's approval, levetiracetam refill Rx printed. Rx signed. I called patient and informed him the Rx is at front desk for him to pick up. Patient verbalized understanding, appreciation.

## 2018-06-07 NOTE — Telephone Encounter (Signed)
This is ok

## 2018-06-20 DIAGNOSIS — H401132 Primary open-angle glaucoma, bilateral, moderate stage: Secondary | ICD-10-CM | POA: Diagnosis not present

## 2018-06-20 DIAGNOSIS — H5340 Unspecified visual field defects: Secondary | ICD-10-CM | POA: Diagnosis not present

## 2018-07-06 NOTE — Progress Notes (Signed)
PATIENT: Trevor Kelley DOB: 03/13/1951  REASON FOR VISIT: follow up HISTORY FROM: patient  HISTORY OF PRESENT ILLNESS: Today 07/11/18  [name] is a 68 year old male who presents for follow-up with a history of seizures.  He is currently taking Keppra 500 mg twice a day.  He is tolerating the medication well.  His last seizure event was in October 2018.  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  He is currently retired after working for [employer] for 40 years.  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  He presents today for follow-up he denies any new problems or concerns.  HISTORY 11/30/2017 MM [name] is a 68 year old male with a history of seizures.  He returns today for follow-up.  At the last visit he was restarted on Keppra 500 mg twice a day.  He denies any seizure events.  He is able to complete all ADLs independently.   the patient is back to operating a motor vehicle.  His last seizure was in October.  Denies any changes in his mood or behavior.  He returns today for evaluation.   REVIEW OF SYSTEMS: Out of a complete 14 system review of symptoms, the patient complains only of the following symptoms, and all other reviewed systems are negative.    ALLERGIES: No Known Allergies  HOME MEDICATIONS: Outpatient Medications Prior to Visit  Medication Sig Dispense Refill  . amLODipine (NORVASC) 10 MG tablet Take 10 mg by mouth 2 (two) times daily.     Marland Kitchen amoxicillin-clavulanate (AUGMENTIN) 500-125 MG tablet Take 1 tablet (500 mg total) by mouth 3 (three) times daily. 6 tablet 0  . calcitRIOL (ROCALTROL) 0.25 MCG capsule Take 0.25 mcg by mouth daily.    . hydrocortisone (CORTEF) 10 MG tablet Take 10-15 mg by mouth See admin instructions. Take 15 mg  tablets every morning and take 10 mg tablet daily    . levETIRAcetam (KEPPRA) 500 MG  tablet Take 1 tablet (500 mg total) by mouth 2 (two) times daily. 180 tablet 3  . levothyroxine (SYNTHROID, LEVOTHROID) 88 MCG tablet Take 88 mcg by mouth daily before breakfast.    . LUMIGAN 0.01 % SOLN Place 1 drop into both eyes at bedtime.   3  . simvastatin (ZOCOR) 20 MG tablet Take 20 mg by mouth daily at 6 PM.     . tamsulosin (FLOMAX) 0.4 MG CAPS capsule Take 0.4 mg by mouth daily.     . timolol (TIMOPTIC) 0.5 % ophthalmic solution Place 1 drop into both eyes daily.      No facility-administered medications prior to visit.     PAST MEDICAL HISTORY: Past Medical History:  Diagnosis Date  . CKD (chronic kidney disease)   . Diabetes mellitus without complication ([place])   . History of pituitary adenoma   . Hypertension   . Hypothyroidism   . OSA (obstructive sleep apnea)   . Seizure disorder ([place]) 05/31/2017    PAST SURGICAL HISTORY: Past Surgical History:  Procedure Laterality Date  . BRAIN SURGERY      FAMILY HISTORY: Family History  Problem Relation Age of Onset  . Hypothyroidism Unknown   . Diabetes Mellitus II Unknown   . Cancer Unknown   . Hypertension Unknown     SOCIAL HISTORY: Social History   Socioeconomic History  . Marital status: Married  Spouse name: Neoma Laming  . Number of children: 2  . Years of education: 60  . Highest education level: Not on file  Occupational History  . Occupation: Retired  Scientific laboratory technician  . Financial resource strain: Not on file  . Food insecurity:    Worry: Not on file    Inability: Not on file  . Transportation needs:    Medical: Not on file    Non-medical: Not on file  Tobacco Use  . Smoking status: Never Smoker  . Smokeless tobacco: Never Used  Substance and Sexual Activity  . Alcohol use: No  . Drug use: No  . Sexual activity: Never  Lifestyle  . Physical activity:    Days per week: Not on file    Minutes per session: Not on file  . Stress: Not on file  Relationships  . Social connections:    Talks on  phone: Not on file    Gets together: Not on file    Attends religious service: Not on file    Active member of club or organization: Not on file    Attends meetings of clubs or organizations: Not on file    Relationship status: Not on file  . Intimate partner violence:    Fear of current or ex partner: Not on file    Emotionally abused: Not on file    Physically abused: Not on file    Forced sexual activity: Not on file  Other Topics Concern  . Not on file  Social History Narrative   Lives with wife   Married, 2 children   Right handed   12 th grade   1 cup daily      PHYSICAL EXAM  Vitals:   07/11/18 0840  BP: 118/76  Pulse: 70  Weight: 245 lb (111.1 kg)  Height: 6' (1.829 m)   Body mass index is 33.23 kg/m.  Generalized: Well developed, in no acute distress   Neurological examination  Mentation: Alert oriented to time, place, history taking. Follows all commands speech and language fluent Cranial nerve II-XII: Pupils were equal round reactive to light. Extraocular movements were full, visual field were full on confrontational test. Facial sensation and strength were normal. Uvula tongue midline. Head turning and shoulder shrug  were normal and symmetric. Motor: The motor testing reveals 5 over 5 strength of all 4 extremities. Good symmetric motor tone is noted throughout.  Sensory: Sensory testing is intact to soft touch on all 4 extremities. No evidence of extinction is noted.  Coordination: Cerebellar testing reveals good finger-nose-finger and heel-to-shin bilaterally.  Gait and station: Gait is normal. Tandem gait is normal. Romberg is negative. No drift is seen.  Reflexes: Deep tendon reflexes are symmetric and normal bilaterally.   DIAGNOSTIC DATA (LABS, IMAGING, TESTING) - I reviewed patient records, labs, notes, testing and imaging myself where available.  Lab Results  Component Value Date   WBC 14.1 (H) 02/25/2018   HGB 8.7 (L) 02/25/2018   HCT 28.7 (L)  02/25/2018   MCV 81.5 02/25/2018   PLT 196 02/25/2018      Component Value Date/Time   NA 139 02/26/2018 0350   K 4.7 02/26/2018 0350   CL 113 (H) 02/26/2018 0350   CO2 21 (L) 02/26/2018 0350   GLUCOSE 112 (H) 02/26/2018 0350   BUN 51 (H) 02/26/2018 0350   CREATININE 2.48 (H) 02/26/2018 0350   CALCIUM 8.5 (L) 02/26/2018 0350   PROT 6.5 02/24/2018 0312   ALBUMIN 2.9 (L) 02/24/2018 2725  AST 18 02/24/2018 0312   ALT 18 02/24/2018 0312   ALKPHOS 39 02/24/2018 0312   BILITOT 0.5 02/24/2018 0312   GFRNONAA 25 (L) 02/26/2018 0350   GFRAA 29 (L) 02/26/2018 0350   Lab Results  Component Value Date   CHOL 122 09/03/2013   HDL 30 (L) 09/03/2013   LDLCALC 71 09/03/2013   TRIG 103 09/03/2013   Lab Results  Component Value Date   HGBA1C 5.9 (H) 12/31/2016   Lab Results  Component Value Date   VITAMINB12 302 12/31/2016   No results found for: TSH    ASSESSMENT AND PLAN 68 y.o. year old male  has a past medical history of CKD (chronic kidney disease), Diabetes mellitus without complication ([place]), History of pituitary adenoma, Hypertension, Hypothyroidism, OSA (obstructive sleep apnea), and Seizure disorder ([place]) (05/31/2017). here with:  1.  Seizure disorder  Overall he is doing very well.  He will continue taking Keppra 500 mg twice daily.  He has not had any seizure events since October 2018. He will follow-up in 6 months or sooner if needed.  We discussed that he may be able to follow-up on an annual basis after his next visit.  I advised him that if he has any further seizure events he should let us know.   I spent 15 minutes with the patient. 50% of this time was spent discussing his plan of care.    Butler Denmark, AGNP-C, DNP 07/11/2018, 9:07 AM [facility] [address], [place], [place] [address] [phone]

## 2018-07-11 ENCOUNTER — Encounter: Payer: Self-pay | Admitting: Neurology

## 2018-07-11 ENCOUNTER — Ambulatory Visit (INDEPENDENT_AMBULATORY_CARE_PROVIDER_SITE_OTHER): Payer: Medicare Other | Admitting: Neurology

## 2018-07-11 VITALS — BP 118/76 | HR 70 | Ht 72.0 in | Wt 245.0 lb

## 2018-07-11 DIAGNOSIS — G40909 Epilepsy, unspecified, not intractable, without status epilepticus: Secondary | ICD-10-CM

## 2018-07-11 NOTE — Progress Notes (Signed)
I have read the note, and I agree with the clinical assessment and plan.  [name]   

## 2018-09-05 DIAGNOSIS — E113291 Type 2 diabetes mellitus with mild nonproliferative diabetic retinopathy without macular edema, right eye: Secondary | ICD-10-CM | POA: Diagnosis not present

## 2018-09-05 DIAGNOSIS — H401132 Primary open-angle glaucoma, bilateral, moderate stage: Secondary | ICD-10-CM | POA: Diagnosis not present

## 2018-09-05 DIAGNOSIS — H2513 Age-related nuclear cataract, bilateral: Secondary | ICD-10-CM | POA: Diagnosis not present

## 2018-09-27 DIAGNOSIS — E038 Other specified hypothyroidism: Secondary | ICD-10-CM | POA: Diagnosis not present

## 2018-09-27 DIAGNOSIS — D631 Anemia in chronic kidney disease: Secondary | ICD-10-CM | POA: Diagnosis not present

## 2018-09-27 DIAGNOSIS — N183 Chronic kidney disease, stage 3 (moderate): Secondary | ICD-10-CM | POA: Diagnosis not present

## 2018-09-27 DIAGNOSIS — D352 Benign neoplasm of pituitary gland: Secondary | ICD-10-CM | POA: Diagnosis not present

## 2018-09-27 DIAGNOSIS — N189 Chronic kidney disease, unspecified: Secondary | ICD-10-CM | POA: Diagnosis not present

## 2018-09-27 DIAGNOSIS — R809 Proteinuria, unspecified: Secondary | ICD-10-CM | POA: Diagnosis not present

## 2018-09-27 DIAGNOSIS — R319 Hematuria, unspecified: Secondary | ICD-10-CM | POA: Diagnosis not present

## 2018-11-15 DIAGNOSIS — E669 Obesity, unspecified: Secondary | ICD-10-CM | POA: Diagnosis not present

## 2018-11-15 DIAGNOSIS — I10 Essential (primary) hypertension: Secondary | ICD-10-CM | POA: Diagnosis not present

## 2018-11-15 DIAGNOSIS — Z6832 Body mass index (BMI) 32.0-32.9, adult: Secondary | ICD-10-CM | POA: Diagnosis not present

## 2018-11-15 DIAGNOSIS — E1169 Type 2 diabetes mellitus with other specified complication: Secondary | ICD-10-CM | POA: Diagnosis not present

## 2018-11-15 DIAGNOSIS — D352 Benign neoplasm of pituitary gland: Secondary | ICD-10-CM | POA: Diagnosis not present

## 2018-11-15 DIAGNOSIS — E230 Hypopituitarism: Secondary | ICD-10-CM | POA: Diagnosis not present

## 2018-11-15 DIAGNOSIS — E2749 Other adrenocortical insufficiency: Secondary | ICD-10-CM | POA: Diagnosis not present

## 2019-01-02 DIAGNOSIS — H5340 Unspecified visual field defects: Secondary | ICD-10-CM | POA: Diagnosis not present

## 2019-01-02 DIAGNOSIS — H401132 Primary open-angle glaucoma, bilateral, moderate stage: Secondary | ICD-10-CM | POA: Diagnosis not present

## 2019-01-10 NOTE — Progress Notes (Signed)
PATIENT: Trevor Kelley DOB: 1950-12-28  REASON FOR VISIT: follow up HISTORY FROM: patient  HISTORY OF PRESENT ILLNESS: Today 01/11/19  [name] is a 68 year old male with history of seizures.  He remains on Keppra 500 mg twice a day.  His last seizure event was in October 2018, which is actually the only seizure event he has had.  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  He has not had recurrent seizure.  He does operate a motor vehicle without difficulty.  He is retired, is active in his church.  He indicates he is doing very well.  He walks every day.  He lives with his wife.  He reports his diabetes is under good control, with an A1C of 6.1.  He says he feels great.  He presents today for follow-up unaccompanied.  HISTORY 07/11/2018 SS: [name] is a 68 year old male who presents for follow-up with a history of seizures.  He is currently taking Keppra 500 mg twice a day.  He is tolerating the medication well.  His last seizure event was in October 2018.  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  He is currently retired after working for [employer] for 40 years.  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  He presents today for follow-up he denies any new problems or concerns.  REVIEW OF SYSTEMS: Out of a complete 14 system review of symptoms, the patient complains only of the following symptoms, and all other reviewed systems are negative.  Seizures  ALLERGIES: No Known Allergies  HOME MEDICATIONS: Outpatient Medications Prior to Visit  Medication Sig Dispense Refill  . amLODipine (NORVASC) 10 MG tablet Take 10 mg by mouth 2 (two) times daily.     . calcitRIOL (ROCALTROL) 0.25 MCG capsule Take 0.25 mcg by mouth daily.    . furosemide  (LASIX) 80 MG tablet Take 80 mg by mouth daily.    . hydrocortisone (CORTEF) 10 MG tablet Take 10-15 mg by mouth See admin instructions. Take 15 mg  tablets every morning and take 10 mg tablet daily    . levETIRAcetam (KEPPRA) 500 MG tablet Take 1 tablet (500 mg total) by mouth 2 (two) times daily. 180 tablet 3  . levothyroxine (SYNTHROID, LEVOTHROID) 88 MCG tablet Take 88 mcg by mouth daily before breakfast.    . simvastatin (ZOCOR) 20 MG tablet Take 20 mg by mouth daily at 6 PM.     . tamsulosin (FLOMAX) 0.4 MG CAPS capsule Take 0.4 mg by mouth daily.     . timolol (TIMOPTIC) 0.5 % ophthalmic solution Place 1 drop into both eyes daily.     Marland Kitchen amoxicillin-clavulanate (AUGMENTIN) 500-125 MG tablet Take 1 tablet (500 mg total) by mouth 3 (three) times daily. (Patient not taking: Reported on 01/11/2019) 6 tablet 0  . LUMIGAN 0.01 % SOLN Place 1 drop into both eyes at bedtime.   3   No facility-administered medications prior to visit.     PAST MEDICAL HISTORY: Past Medical History:  Diagnosis Date  . CKD (chronic kidney disease)   . Diabetes mellitus without complication ([place])   . History of pituitary adenoma   . Hypertension   . Hypothyroidism   . OSA (obstructive sleep apnea)   . Seizure disorder (  [place]) 05/31/2017    PAST SURGICAL HISTORY: Past Surgical History:  Procedure Laterality Date  . BRAIN SURGERY      FAMILY HISTORY: Family History  Problem Relation Age of Onset  . Hypothyroidism Unknown   . Diabetes Mellitus II Unknown   . Cancer Unknown   . Hypertension Unknown     SOCIAL HISTORY: Social History   Socioeconomic History  . Marital status: Married    Spouse name: Neoma Laming  . Number of children: 2  . Years of education: 20  . Highest education level: Not on file  Occupational History  . Occupation: Retired  Scientific laboratory technician  . Financial resource strain: Not on file  . Food insecurity    Worry: Not on file    Inability: Not on file  . Transportation needs     Medical: Not on file    Non-medical: Not on file  Tobacco Use  . Smoking status: Never Smoker  . Smokeless tobacco: Never Used  Substance and Sexual Activity  . Alcohol use: No  . Drug use: No  . Sexual activity: Never  Lifestyle  . Physical activity    Days per week: Not on file    Minutes per session: Not on file  . Stress: Not on file  Relationships  . Social Herbalist on phone: Not on file    Gets together: Not on file    Attends religious service: Not on file    Active member of club or organization: Not on file    Attends meetings of clubs or organizations: Not on file    Relationship status: Not on file  . Intimate partner violence    Fear of current or ex partner: Not on file    Emotionally abused: Not on file    Physically abused: Not on file    Forced sexual activity: Not on file  Other Topics Concern  . Not on file  Social History Narrative   Lives with wife   Married, 2 children   Right handed   12 th grade   1 cup daily    PHYSICAL EXAM  Vitals:   01/11/19 1003  BP: (!) 145/97  Pulse: 62  Temp: 98 F (36.7 C)  TempSrc: Oral  Weight: 236 lb (107 kg)  Height: 6' (1.829 m)   Body mass index is 32.01 kg/m.  Generalized: Well developed, in no acute distress   Neurological examination  Mentation: Alert oriented to time, place, history taking. Follows all commands speech and language fluent Cranial nerve II-XII: Pupils were equal round reactive to light. Extraocular movements were full, visual field were full on confrontational test. Facial sensation and strength were normal. Head turning and shoulder shrug  were normal and symmetric. Motor: The motor testing reveals 5 over 5 strength of all 4 extremities. Good symmetric motor tone is noted throughout.  Sensory: Sensory testing is intact to soft touch on all 4 extremities. No evidence of extinction is noted.  Coordination: Cerebellar testing reveals good finger-nose-finger and heel-to-shin  bilaterally.  Gait and station: Gait is normal. Tandem gait is normal. Romberg is negative. No drift is seen.  Reflexes: Deep tendon reflexes are symmetric and normal bilaterally.   DIAGNOSTIC DATA (LABS, IMAGING, TESTING) - I reviewed patient records, labs, notes, testing and imaging myself where available.  Lab Results  Component Value Date   WBC 14.1 (H) 02/25/2018   HGB 8.7 (L) 02/25/2018   HCT 28.7 (L) 02/25/2018   MCV 81.5 02/25/2018  PLT 196 02/25/2018      Component Value Date/Time   NA 139 02/26/2018 0350   K 4.7 02/26/2018 0350   CL 113 (H) 02/26/2018 0350   CO2 21 (L) 02/26/2018 0350   GLUCOSE 112 (H) 02/26/2018 0350   BUN 51 (H) 02/26/2018 0350   CREATININE 2.48 (H) 02/26/2018 0350   CALCIUM 8.5 (L) 02/26/2018 0350   PROT 6.5 02/24/2018 0312   ALBUMIN 2.9 (L) 02/24/2018 0312   AST 18 02/24/2018 0312   ALT 18 02/24/2018 0312   ALKPHOS 39 02/24/2018 0312   BILITOT 0.5 02/24/2018 0312   GFRNONAA 25 (L) 02/26/2018 0350   GFRAA 29 (L) 02/26/2018 0350   Lab Results  Component Value Date   CHOL 122 09/03/2013   HDL 30 (L) 09/03/2013   LDLCALC 71 09/03/2013   TRIG 103 09/03/2013   Lab Results  Component Value Date   HGBA1C 5.9 (H) 12/31/2016   Lab Results  Component Value Date   VITAMINB12 302 12/31/2016   No results found for: TSH   ASSESSMENT AND PLAN 68 y.o. year old male  has a past medical history of CKD (chronic kidney disease), Diabetes mellitus without complication ([place]), History of pituitary adenoma, Hypertension, Hypothyroidism, OSA (obstructive sleep apnea), and Seizure disorder ([place]) (05/31/2017). here with:  1.  Seizures  He continues to do quite well.  He will continue taking Keppra 500 mg twice a day.  He has not had a seizure event since October 2018.  I will send in a refill for Keppra.  He will follow-up in 1 year or sooner if needed.  I did advise if his symptoms worsen or if he develops any new symptoms he should let us know.  I spent  15 minutes with the patient. 50% of this time was spent discussing his plan of care.   [place] Denmark, AGNP-C, DNP 01/11/2019, 10:24 AM [facility] [address] [place], [place] [address] [phone]

## 2019-01-11 ENCOUNTER — Other Ambulatory Visit: Payer: Self-pay

## 2019-01-11 ENCOUNTER — Ambulatory Visit (INDEPENDENT_AMBULATORY_CARE_PROVIDER_SITE_OTHER): Payer: Medicare Other | Admitting: Neurology

## 2019-01-11 ENCOUNTER — Encounter: Payer: Self-pay | Admitting: Neurology

## 2019-01-11 VITALS — BP 145/97 | HR 62 | Temp 98.0°F | Ht 72.0 in | Wt 236.0 lb

## 2019-01-11 DIAGNOSIS — G40909 Epilepsy, unspecified, not intractable, without status epilepticus: Secondary | ICD-10-CM | POA: Diagnosis not present

## 2019-01-11 MED ORDER — LEVETIRACETAM 500 MG PO TABS: 500.0000 mg | ORAL_TABLET | Freq: Two times a day (BID) | ORAL | 3 refills | Status: DC

## 2019-01-11 NOTE — Patient Instructions (Signed)
Continue Keppra, we will see you in 1 year or sooner if needed!

## 2019-01-11 NOTE — Progress Notes (Signed)
I have read the note, and I agree with the clinical assessment and plan.  Trevor Kelley K Trevor Kelley   

## 2019-01-31 DIAGNOSIS — D631 Anemia in chronic kidney disease: Secondary | ICD-10-CM | POA: Diagnosis not present

## 2019-01-31 DIAGNOSIS — R809 Proteinuria, unspecified: Secondary | ICD-10-CM | POA: Diagnosis not present

## 2019-01-31 DIAGNOSIS — N2581 Secondary hyperparathyroidism of renal origin: Secondary | ICD-10-CM | POA: Diagnosis not present

## 2019-01-31 DIAGNOSIS — E669 Obesity, unspecified: Secondary | ICD-10-CM | POA: Diagnosis not present

## 2019-01-31 DIAGNOSIS — N189 Chronic kidney disease, unspecified: Secondary | ICD-10-CM | POA: Diagnosis not present

## 2019-01-31 DIAGNOSIS — N183 Chronic kidney disease, stage 3 (moderate): Secondary | ICD-10-CM | POA: Diagnosis not present

## 2019-01-31 DIAGNOSIS — I129 Hypertensive chronic kidney disease with stage 1 through stage 4 chronic kidney disease, or unspecified chronic kidney disease: Secondary | ICD-10-CM | POA: Diagnosis not present

## 2019-01-31 DIAGNOSIS — R319 Hematuria, unspecified: Secondary | ICD-10-CM | POA: Diagnosis not present

## 2019-01-31 DIAGNOSIS — D352 Benign neoplasm of pituitary gland: Secondary | ICD-10-CM | POA: Diagnosis not present

## 2019-01-31 DIAGNOSIS — E038 Other specified hypothyroidism: Secondary | ICD-10-CM | POA: Diagnosis not present

## 2019-01-31 DIAGNOSIS — Z23 Encounter for immunization: Secondary | ICD-10-CM | POA: Diagnosis not present

## 2019-01-31 DIAGNOSIS — E782 Mixed hyperlipidemia: Secondary | ICD-10-CM | POA: Diagnosis not present

## 2019-01-31 DIAGNOSIS — E2749 Other adrenocortical insufficiency: Secondary | ICD-10-CM | POA: Diagnosis not present

## 2019-02-01 DIAGNOSIS — R809 Proteinuria, unspecified: Secondary | ICD-10-CM | POA: Diagnosis not present

## 2019-03-20 DIAGNOSIS — H401132 Primary open-angle glaucoma, bilateral, moderate stage: Secondary | ICD-10-CM | POA: Diagnosis not present

## 2019-05-15 DIAGNOSIS — E2749 Other adrenocortical insufficiency: Secondary | ICD-10-CM | POA: Diagnosis not present

## 2019-05-15 DIAGNOSIS — E1169 Type 2 diabetes mellitus with other specified complication: Secondary | ICD-10-CM | POA: Diagnosis not present

## 2019-05-15 DIAGNOSIS — E221 Hyperprolactinemia: Secondary | ICD-10-CM | POA: Diagnosis not present

## 2019-05-15 DIAGNOSIS — R799 Abnormal finding of blood chemistry, unspecified: Secondary | ICD-10-CM | POA: Diagnosis not present

## 2019-05-15 DIAGNOSIS — E230 Hypopituitarism: Secondary | ICD-10-CM | POA: Diagnosis not present

## 2019-05-15 DIAGNOSIS — E669 Obesity, unspecified: Secondary | ICD-10-CM | POA: Diagnosis not present

## 2019-05-15 DIAGNOSIS — I10 Essential (primary) hypertension: Secondary | ICD-10-CM | POA: Diagnosis not present

## 2019-05-30 DIAGNOSIS — R319 Hematuria, unspecified: Secondary | ICD-10-CM | POA: Diagnosis not present

## 2019-05-30 DIAGNOSIS — D631 Anemia in chronic kidney disease: Secondary | ICD-10-CM | POA: Diagnosis not present

## 2019-05-30 DIAGNOSIS — I129 Hypertensive chronic kidney disease with stage 1 through stage 4 chronic kidney disease, or unspecified chronic kidney disease: Secondary | ICD-10-CM | POA: Diagnosis not present

## 2019-05-30 DIAGNOSIS — N2581 Secondary hyperparathyroidism of renal origin: Secondary | ICD-10-CM | POA: Diagnosis not present

## 2019-05-30 DIAGNOSIS — N529 Male erectile dysfunction, unspecified: Secondary | ICD-10-CM | POA: Diagnosis not present

## 2019-05-30 DIAGNOSIS — R809 Proteinuria, unspecified: Secondary | ICD-10-CM | POA: Diagnosis not present

## 2019-05-30 DIAGNOSIS — N189 Chronic kidney disease, unspecified: Secondary | ICD-10-CM | POA: Diagnosis not present

## 2019-05-30 DIAGNOSIS — R569 Unspecified convulsions: Secondary | ICD-10-CM | POA: Diagnosis not present

## 2019-05-30 DIAGNOSIS — E2749 Other adrenocortical insufficiency: Secondary | ICD-10-CM | POA: Diagnosis not present

## 2019-05-30 DIAGNOSIS — N183 Chronic kidney disease, stage 3 unspecified: Secondary | ICD-10-CM | POA: Diagnosis not present

## 2019-05-30 DIAGNOSIS — E782 Mixed hyperlipidemia: Secondary | ICD-10-CM | POA: Diagnosis not present

## 2019-05-30 DIAGNOSIS — D352 Benign neoplasm of pituitary gland: Secondary | ICD-10-CM | POA: Diagnosis not present

## 2019-05-30 DIAGNOSIS — E038 Other specified hypothyroidism: Secondary | ICD-10-CM | POA: Diagnosis not present

## 2019-05-31 DIAGNOSIS — N401 Enlarged prostate with lower urinary tract symptoms: Secondary | ICD-10-CM | POA: Diagnosis not present

## 2019-05-31 DIAGNOSIS — N5201 Erectile dysfunction due to arterial insufficiency: Secondary | ICD-10-CM | POA: Diagnosis not present

## 2019-05-31 DIAGNOSIS — E291 Testicular hypofunction: Secondary | ICD-10-CM | POA: Diagnosis not present

## 2019-05-31 DIAGNOSIS — R800 Isolated proteinuria: Secondary | ICD-10-CM | POA: Diagnosis not present

## 2019-05-31 DIAGNOSIS — R351 Nocturia: Secondary | ICD-10-CM | POA: Diagnosis not present

## 2019-07-05 DIAGNOSIS — N401 Enlarged prostate with lower urinary tract symptoms: Secondary | ICD-10-CM | POA: Diagnosis not present

## 2019-07-05 DIAGNOSIS — E291 Testicular hypofunction: Secondary | ICD-10-CM | POA: Diagnosis not present

## 2019-07-27 DIAGNOSIS — H2513 Age-related nuclear cataract, bilateral: Secondary | ICD-10-CM | POA: Diagnosis not present

## 2019-07-27 DIAGNOSIS — H401132 Primary open-angle glaucoma, bilateral, moderate stage: Secondary | ICD-10-CM | POA: Diagnosis not present

## 2019-07-27 DIAGNOSIS — H53431 Sector or arcuate defects, right eye: Secondary | ICD-10-CM | POA: Diagnosis not present

## 2019-08-22 DIAGNOSIS — E291 Testicular hypofunction: Secondary | ICD-10-CM | POA: Diagnosis not present

## 2019-08-22 DIAGNOSIS — R948 Abnormal results of function studies of other organs and systems: Secondary | ICD-10-CM | POA: Diagnosis not present

## 2019-08-29 DIAGNOSIS — N401 Enlarged prostate with lower urinary tract symptoms: Secondary | ICD-10-CM | POA: Diagnosis not present

## 2019-08-29 DIAGNOSIS — R351 Nocturia: Secondary | ICD-10-CM | POA: Diagnosis not present

## 2019-08-29 DIAGNOSIS — N5201 Erectile dysfunction due to arterial insufficiency: Secondary | ICD-10-CM | POA: Diagnosis not present

## 2019-08-29 DIAGNOSIS — R972 Elevated prostate specific antigen [PSA]: Secondary | ICD-10-CM | POA: Diagnosis not present

## 2019-08-29 DIAGNOSIS — E291 Testicular hypofunction: Secondary | ICD-10-CM | POA: Diagnosis not present

## 2019-09-10 IMAGING — CT CT HEAD W/O CM
3 series · 15 of 47 positions shown, 18 images · non-contrast
Comparison: 09/11/2013 MRI of the head

CLINICAL DATA: 66 y/o M; new onset seizure tonight. History of
brain tumor post craniotomy 3 years ago.

EXAM:
CT HEAD WITHOUT CONTRAST
TECHNIQUE: Contiguous axial images were obtained from the base of the skull
through the vertex without intravenous contrast.

[Series 3: head 5.0 h30s · axial · 0.48mm/px · z∈[-98,+47]mm · 9 of 35 slices shown, 12 images]
[im 3/35  brain]
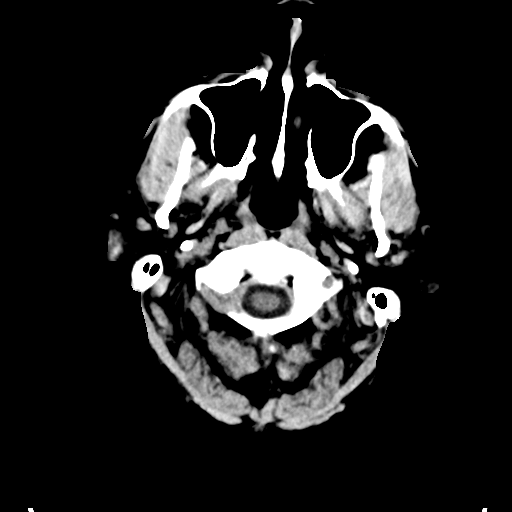
[im 3/35  bone]
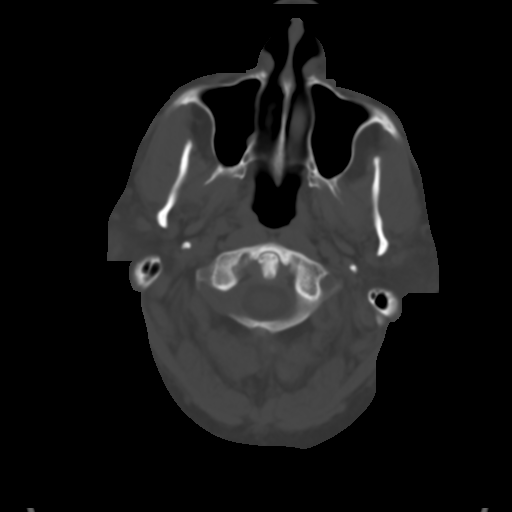
[im 6/35  brain]
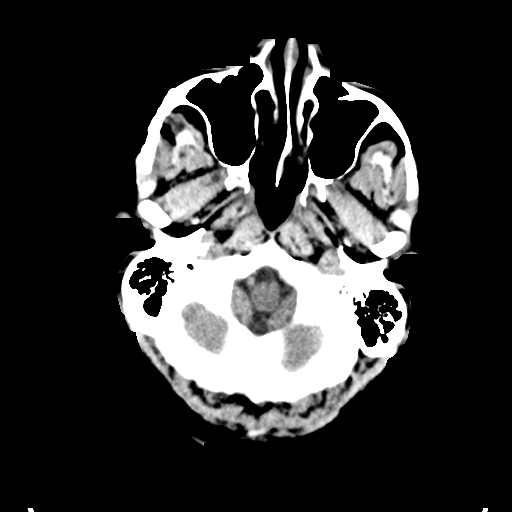
[im 10/35  brain]
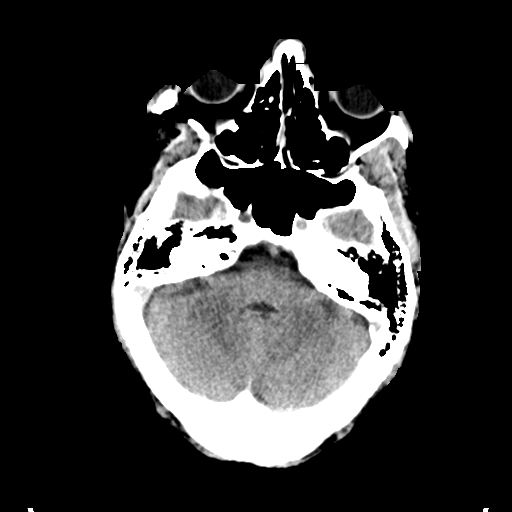
[im 13/35  brain]
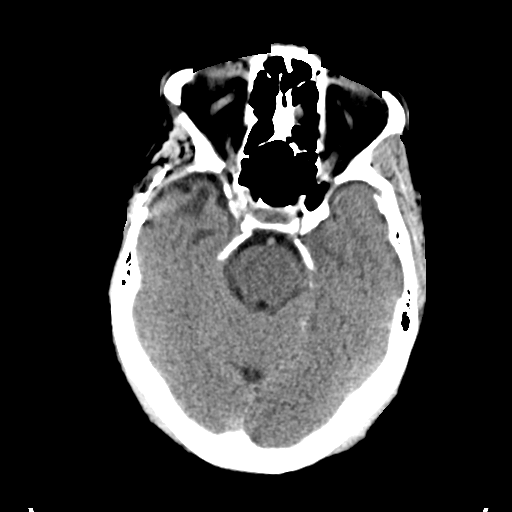
[im 18/35  brain]
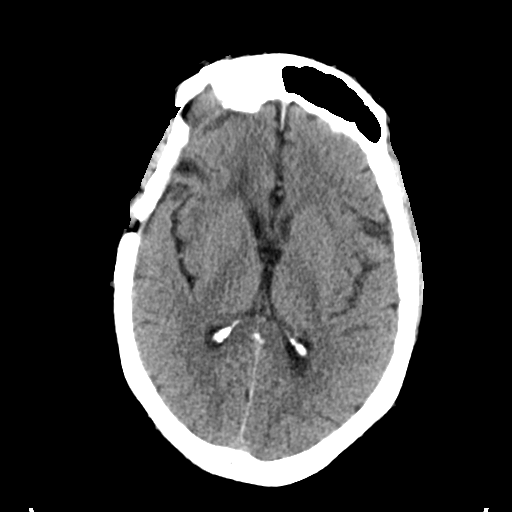
[im 18/35  bone]
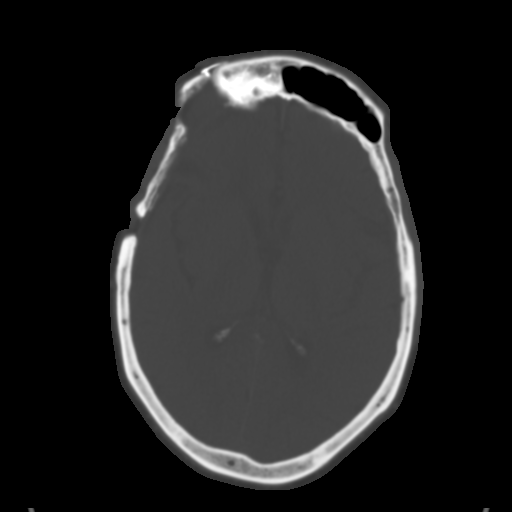
[im 22/35  brain]
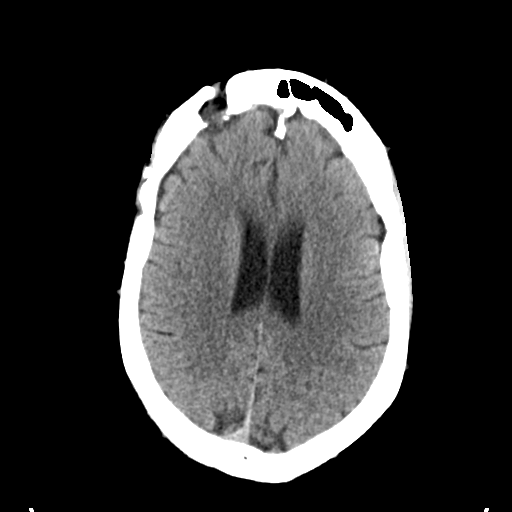
[im 25/35  brain]
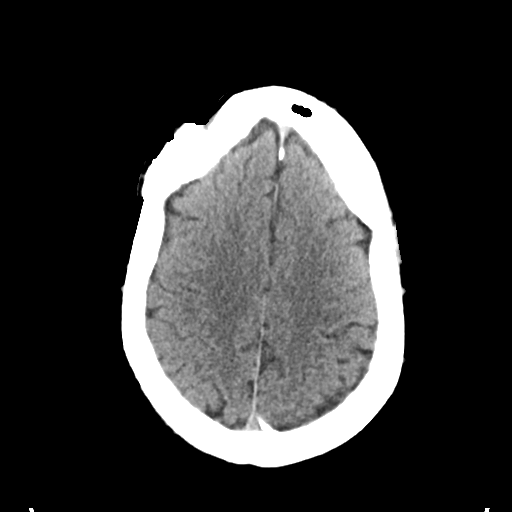
[im 29/35  brain]
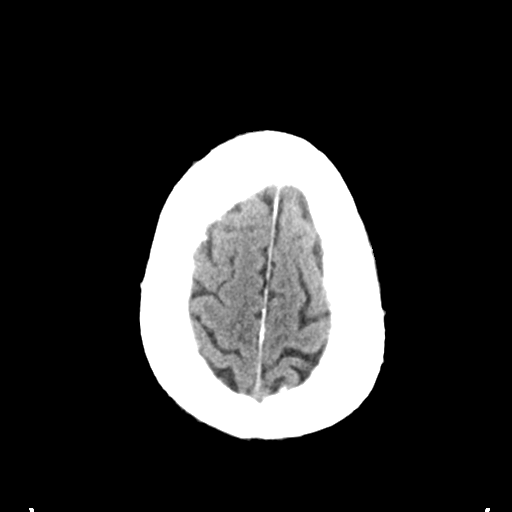
[im 32/35  brain]
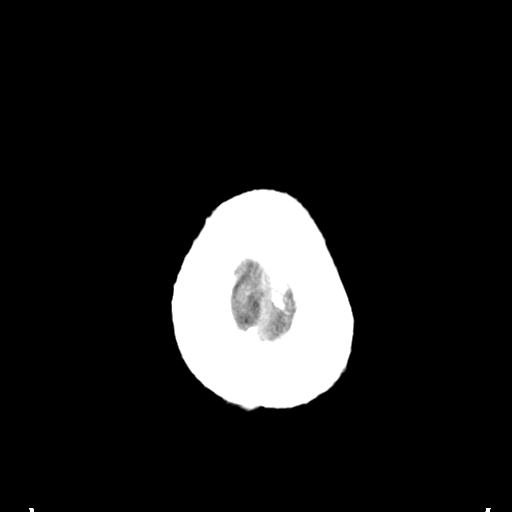
[im 32/35  bone]
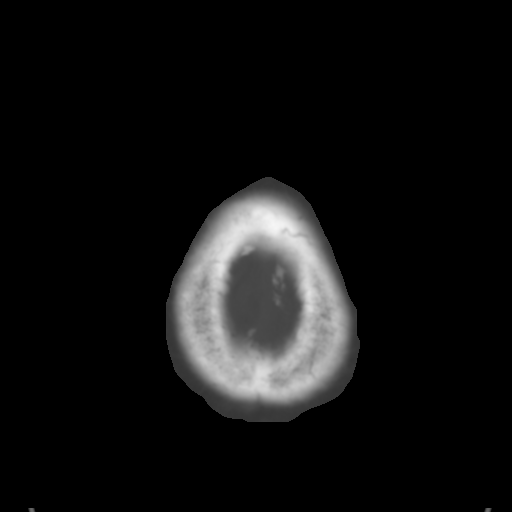

[Series 5: head 3.0 mpr cor · coronal · 0.34mm/px · 3 of 78 slices shown]
[im 26/78  brain]
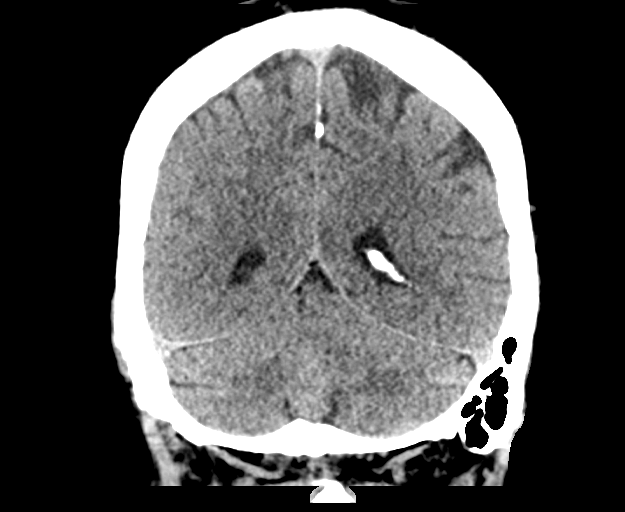
[im 35/78  brain]
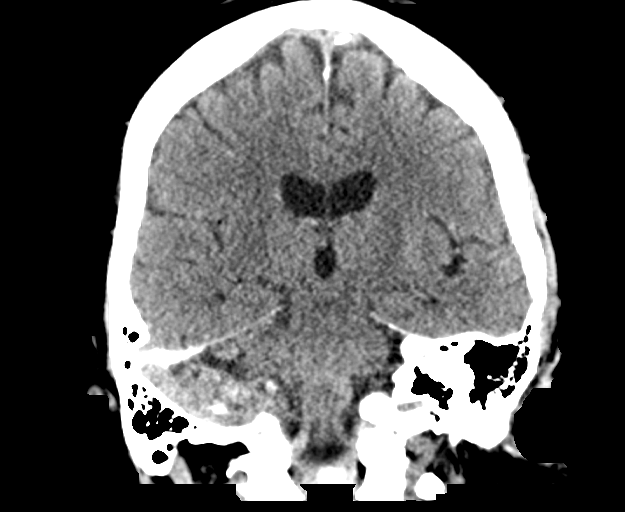
[im 43/78  brain]
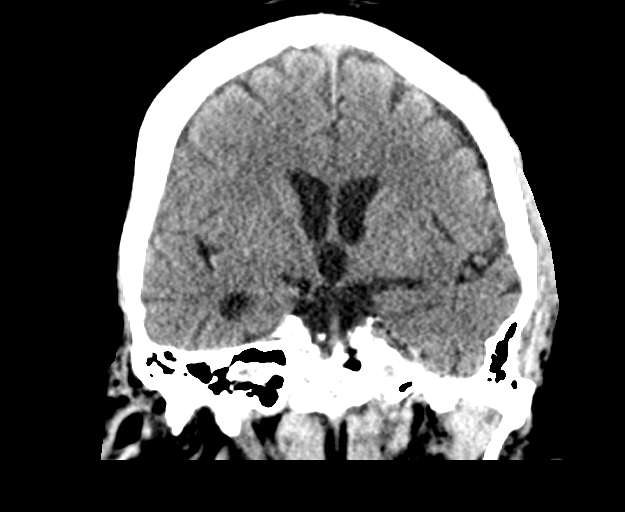

[Series 6: head 3.0 mpr sag · sagittal · 0.38mm/px · 3 of 67 slices shown]
[im 23/67  brain]
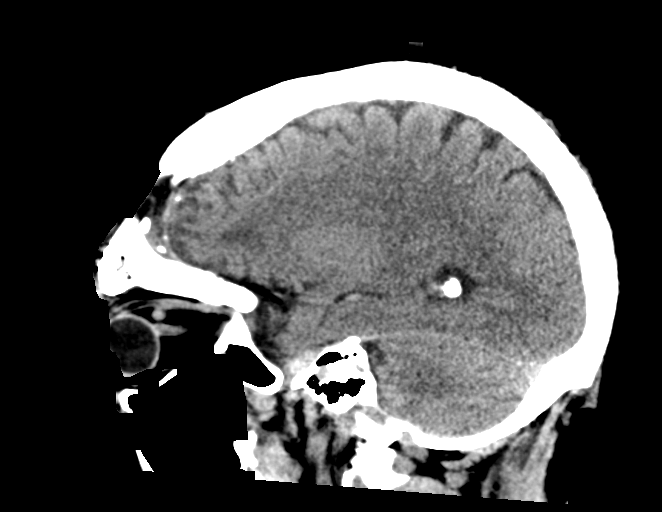
[im 34/67  brain]
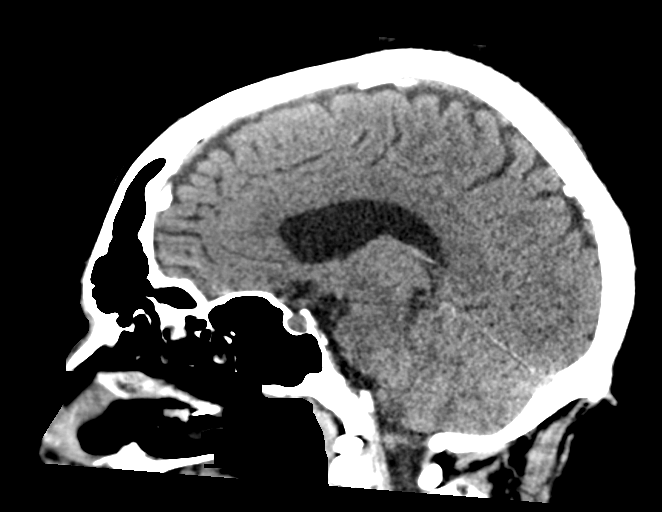
[im 45/67  brain]
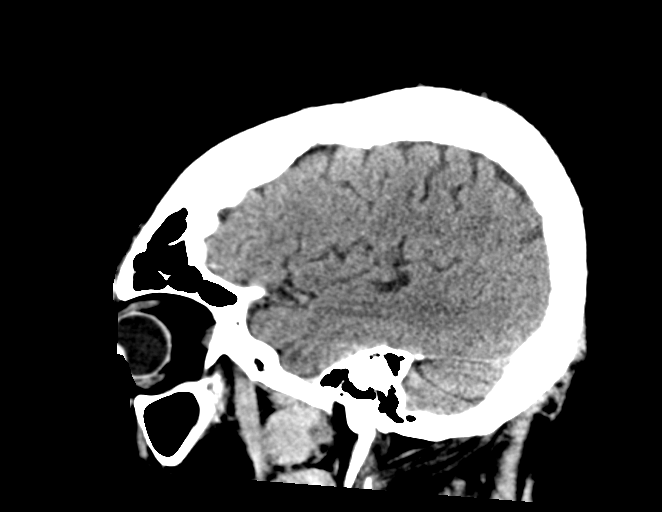

[15 of 47 positions shown; findings below may reference images not displayed]

FINDINGS: Brain: Chronic postsurgical changes related to right frontotemporal
craniotomy with areas of encephalomalacia and inferolateral right
frontal lobe and anterior temporal lobe as well as resection cavity
in the anterior third ventricle.

No acute hemorrhage, large acute infarct, or focal mass effect
identified. No hydrocephalus or effacement of basilar cisterns. No
midline shift.

Vascular: No hyperdense vessel or unexpected calcification.

Skull: Chronic postsurgical changes related to right frontal and
temporal craniotomy. Fixation of the right zygomatic arch. No acute
fracture.

Sinuses/Orbits: Mucous retention cysts are present within the
ethmoid air cells, left sphenoid sinus, and right maxillary sinus.
Normal aeration of mastoid air cells. Chronic postsurgical changes
in the lateral wall of right orbit. Intraorbital contents are
unremarkable.

Other: None.
IMPRESSION: 1. No acute intracranial abnormality.
2. Postsurgical changes with right frontotemporal craniotomy, right
frontal and temporal encephalomalacia, and resection cavity within
anterior third ventricle.
3. Mild paranasal sinus disease.

By: Kv Oneness M.D.

## 2019-11-07 DIAGNOSIS — D631 Anemia in chronic kidney disease: Secondary | ICD-10-CM | POA: Diagnosis not present

## 2019-11-07 DIAGNOSIS — E782 Mixed hyperlipidemia: Secondary | ICD-10-CM | POA: Diagnosis not present

## 2019-11-07 DIAGNOSIS — E2749 Other adrenocortical insufficiency: Secondary | ICD-10-CM | POA: Diagnosis not present

## 2019-11-07 DIAGNOSIS — R319 Hematuria, unspecified: Secondary | ICD-10-CM | POA: Diagnosis not present

## 2019-11-07 DIAGNOSIS — N2581 Secondary hyperparathyroidism of renal origin: Secondary | ICD-10-CM | POA: Diagnosis not present

## 2019-11-07 DIAGNOSIS — N529 Male erectile dysfunction, unspecified: Secondary | ICD-10-CM | POA: Diagnosis not present

## 2019-11-07 DIAGNOSIS — R569 Unspecified convulsions: Secondary | ICD-10-CM | POA: Diagnosis not present

## 2019-11-07 DIAGNOSIS — N1831 Chronic kidney disease, stage 3a: Secondary | ICD-10-CM | POA: Diagnosis not present

## 2019-11-07 DIAGNOSIS — Z0000 Encounter for general adult medical examination without abnormal findings: Secondary | ICD-10-CM | POA: Diagnosis not present

## 2019-11-07 DIAGNOSIS — E038 Other specified hypothyroidism: Secondary | ICD-10-CM | POA: Diagnosis not present

## 2019-11-07 DIAGNOSIS — R809 Proteinuria, unspecified: Secondary | ICD-10-CM | POA: Diagnosis not present

## 2019-11-07 DIAGNOSIS — I129 Hypertensive chronic kidney disease with stage 1 through stage 4 chronic kidney disease, or unspecified chronic kidney disease: Secondary | ICD-10-CM | POA: Diagnosis not present

## 2019-11-13 DIAGNOSIS — I10 Essential (primary) hypertension: Secondary | ICD-10-CM | POA: Diagnosis not present

## 2019-11-13 DIAGNOSIS — E2749 Other adrenocortical insufficiency: Secondary | ICD-10-CM | POA: Diagnosis not present

## 2019-11-13 DIAGNOSIS — E230 Hypopituitarism: Secondary | ICD-10-CM | POA: Diagnosis not present

## 2019-11-13 DIAGNOSIS — E119 Type 2 diabetes mellitus without complications: Secondary | ICD-10-CM | POA: Diagnosis not present

## 2019-11-19 DIAGNOSIS — R972 Elevated prostate specific antigen [PSA]: Secondary | ICD-10-CM | POA: Diagnosis not present

## 2019-11-21 DIAGNOSIS — H2513 Age-related nuclear cataract, bilateral: Secondary | ICD-10-CM | POA: Diagnosis not present

## 2019-11-21 DIAGNOSIS — E119 Type 2 diabetes mellitus without complications: Secondary | ICD-10-CM | POA: Diagnosis not present

## 2019-11-21 DIAGNOSIS — H53431 Sector or arcuate defects, right eye: Secondary | ICD-10-CM | POA: Diagnosis not present

## 2019-11-21 DIAGNOSIS — H401132 Primary open-angle glaucoma, bilateral, moderate stage: Secondary | ICD-10-CM | POA: Diagnosis not present

## 2019-11-26 DIAGNOSIS — R972 Elevated prostate specific antigen [PSA]: Secondary | ICD-10-CM | POA: Diagnosis not present

## 2019-11-26 DIAGNOSIS — E291 Testicular hypofunction: Secondary | ICD-10-CM | POA: Diagnosis not present

## 2019-11-26 DIAGNOSIS — N401 Enlarged prostate with lower urinary tract symptoms: Secondary | ICD-10-CM | POA: Diagnosis not present

## 2019-11-26 DIAGNOSIS — N5201 Erectile dysfunction due to arterial insufficiency: Secondary | ICD-10-CM | POA: Diagnosis not present

## 2019-11-26 DIAGNOSIS — R351 Nocturia: Secondary | ICD-10-CM | POA: Diagnosis not present

## 2019-12-06 DIAGNOSIS — I129 Hypertensive chronic kidney disease with stage 1 through stage 4 chronic kidney disease, or unspecified chronic kidney disease: Secondary | ICD-10-CM | POA: Diagnosis not present

## 2020-01-11 ENCOUNTER — Other Ambulatory Visit: Payer: Self-pay | Admitting: Neurology

## 2020-01-16 ENCOUNTER — Ambulatory Visit (INDEPENDENT_AMBULATORY_CARE_PROVIDER_SITE_OTHER): Payer: Medicare Other | Admitting: Neurology

## 2020-01-16 ENCOUNTER — Encounter: Payer: Self-pay | Admitting: Neurology

## 2020-01-16 ENCOUNTER — Other Ambulatory Visit: Payer: Self-pay

## 2020-01-16 VITALS — BP 140/88 | HR 60 | Ht 72.0 in | Wt 249.6 lb

## 2020-01-16 DIAGNOSIS — G40909 Epilepsy, unspecified, not intractable, without status epilepticus: Secondary | ICD-10-CM | POA: Diagnosis not present

## 2020-01-16 MED ORDER — LEVETIRACETAM 500 MG PO TABS: 500.0000 mg | ORAL_TABLET | Freq: Two times a day (BID) | ORAL | 4 refills | Status: DC

## 2020-01-16 NOTE — Progress Notes (Signed)
PATIENT: Trevor Kelley DOB: Jan 29, 1951  REASON FOR VISIT: follow up HISTORY FROM: patient  HISTORY OF PRESENT ILLNESS: Today 01/16/20 Trevor Kelley 69 year old male with history of seizures.  He is on Keppra.  Last seizure was in October 2018, this is his only seizure event.  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  No recurrent seizure, continues to do well.  Working with PCP for better control of BP, DM well controlled. He walks 1.5 miles every morning.  He remains active, is social. "I feel great".  Would like printed prescription of Keppra to get the best price.  Presents today unaccompanied.  HISTORY 01/11/2019 SS: Trevor Kelley is a 69 year old male with history of seizures.  He remains on Keppra 500 mg twice a day.  His last seizure event was in October 2018, which is actually the only seizure event he has had.  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  He has not had recurrent seizure.  He does operate a motor vehicle without difficulty.  He is retired, is active in his church.  He indicates he is doing very well.  He walks every day.  He lives with his wife.  He reports his diabetes is under good control, with an A1C of 6.1.  He says he feels great.  He presents today for follow-up unaccompanied.  REVIEW OF SYSTEMS: Out of a complete 14 system review of symptoms, the patient complains only of the following symptoms, and all other reviewed systems are negative.  N/A  ALLERGIES: No Known Allergies  HOME MEDICATIONS: Outpatient Medications Prior to Visit  Medication Sig Dispense Refill  . amLODipine (NORVASC) 10 MG tablet Take 10 mg by mouth 2 (two) times daily.     . calcitRIOL (ROCALTROL) 0.25 MCG capsule Take 0.25 mcg by mouth daily.    . furosemide (LASIX) 80 MG tablet  Take 80 mg by mouth daily.    . hydrocortisone (CORTEF) 10 MG tablet Take 10-15 mg by mouth See admin instructions. Take 15 mg  tablets every morning and take 10 mg tablet daily    . latanoprost (XALATAN) 0.005 % ophthalmic solution SMARTSIG:1 Drop(s) In Eye(s) Every Evening    . levothyroxine (SYNTHROID, LEVOTHROID) 88 MCG tablet Take 88 mcg by mouth daily before breakfast.    . LUMIGAN 0.01 % SOLN Place 1 drop into both eyes at bedtime.   3  . simvastatin (ZOCOR) 20 MG tablet Take 20 mg by mouth daily at 6 PM.     . tamsulosin (FLOMAX) 0.4 MG CAPS capsule Take 0.4 mg by mouth daily.     . timolol (TIMOPTIC) 0.5 % ophthalmic solution Place 1 drop into both eyes daily.     Marland Kitchen levETIRAcetam (KEPPRA) 500 MG tablet Take 1 tablet (500 mg total) by mouth 2 (two) times daily. 180 tablet 3  . amoxicillin-clavulanate (AUGMENTIN) 500-125 MG tablet Take 1 tablet (500 mg total) by mouth 3 (three) times daily. (Patient not taking: Reported on 01/11/2019) 6 tablet 0   No facility-administered medications prior to visit.    PAST MEDICAL HISTORY: Past Medical History:  Diagnosis Date  . CKD (chronic kidney disease)   . Diabetes mellitus without complication ([place])   . History of pituitary adenoma   . Hypertension   . Hypothyroidism   . OSA (obstructive sleep apnea)   .  Seizure disorder ([place]) 05/31/2017    PAST SURGICAL HISTORY: Past Surgical History:  Procedure Laterality Date  . BRAIN SURGERY      FAMILY HISTORY: Family History  Problem Relation Age of Onset  . Hypothyroidism Unknown   . Diabetes Mellitus II Unknown   . Cancer Unknown   . Hypertension Unknown     SOCIAL HISTORY: Social History   Socioeconomic History  . Marital status: Married    Spouse name: Trevor Kelley  . Number of children: 2  . Years of education: 49  . Highest education level: Not on file  Occupational History  . Occupation: Retired  Tobacco Use  . Smoking status: Never Smoker  . Smokeless tobacco: Never Used    Vaping Use  . Vaping Use: Never used  Substance and Sexual Activity  . Alcohol use: No  . Drug use: No  . Sexual activity: Never  Other Topics Concern  . Not on file  Social History Narrative   Lives with wife   Married, 2 children   Right handed   12 th grade   1 cup daily   Social Determinants of Health   Financial Resource Strain:   . Difficulty of Paying Living Expenses: Not on file  Food Insecurity:   . Worried About Charity fundraiser in the Last Year: Not on file  . Ran Out of Food in the Last Year: Not on file  Transportation Needs:   . Lack of Transportation (Medical): Not on file  . Lack of Transportation (Non-Medical): Not on file  Physical Activity:   . Days of Exercise per Week: Not on file  . Minutes of Exercise per Session: Not on file  Stress:   . Feeling of Stress : Not on file  Social Connections:   . Frequency of Communication with Friends and Family: Not on file  . Frequency of Social Gatherings with Friends and Family: Not on file  . Attends Religious Services: Not on file  . Active Member of Clubs or Organizations: Not on file  . Attends Archivist Meetings: Not on file  . Marital Status: Not on file  Intimate Partner Violence:   . Fear of Current or Ex-Partner: Not on file  . Emotionally Abused: Not on file  . Physically Abused: Not on file  . Sexually Abused: Not on file  PHYSICAL EXAM  Vitals:   01/16/20 1040  BP: 140/88  Pulse: 60  Weight: 249 lb 9.6 oz (113.2 kg)  Height: 6' (1.829 m)   Body mass index is 33.85 kg/m.  Generalized: Well developed, in no acute distress  Neurological examination  Mentation: Alert oriented to time, place, history taking. Follows all commands speech and language fluent Cranial nerve II-XII: Pupils were equal round reactive to light. Extraocular movements were full, visual field were full on confrontational test. Facial sensation and strength were normal. Head turning and shoulder shrug  were  normal and symmetric. Motor: The motor testing reveals 5 over 5 strength of all 4 extremities. Good symmetric motor tone is noted throughout.  Sensory: Sensory testing is intact to soft touch on all 4 extremities. No evidence of extinction is noted.  Coordination: Cerebellar testing reveals good finger-nose-finger and heel-to-shin bilaterally.  Gait and station: Gait is normal. Reflexes: Deep tendon reflexes are symmetric and normal bilaterally.   DIAGNOSTIC DATA (LABS, IMAGING, TESTING) - I reviewed patient records, labs, notes, testing and imaging myself where available.  Lab Results  Component Value Date   WBC 14.1 (  H) 02/25/2018   HGB 8.7 (L) 02/25/2018   HCT 28.7 (L) 02/25/2018   MCV 81.5 02/25/2018   PLT 196 02/25/2018      Component Value Date/Time   NA 139 02/26/2018 0350   K 4.7 02/26/2018 0350   CL 113 (H) 02/26/2018 0350   CO2 21 (L) 02/26/2018 0350   GLUCOSE 112 (H) 02/26/2018 0350   BUN 51 (H) 02/26/2018 0350   CREATININE 2.48 (H) 02/26/2018 0350   CALCIUM 8.5 (L) 02/26/2018 0350   PROT 6.5 02/24/2018 0312   ALBUMIN 2.9 (L) 02/24/2018 0312   AST 18 02/24/2018 0312   ALT 18 02/24/2018 0312   ALKPHOS 39 02/24/2018 0312   BILITOT 0.5 02/24/2018 0312   GFRNONAA 25 (L) 02/26/2018 0350   GFRAA 29 (L) 02/26/2018 0350   Lab Results  Component Value Date   CHOL 122 09/03/2013   HDL 30 (L) 09/03/2013   LDLCALC 71 09/03/2013   TRIG 103 09/03/2013   Lab Results  Component Value Date   HGBA1C 5.9 (H) 12/31/2016   Lab Results  Component Value Date   VITAMINB12 302 12/31/2016   No results found for: TSH    ASSESSMENT AND PLAN 69 y.o. year old male  has a past medical history of CKD (chronic kidney disease), Diabetes mellitus without complication ([place]), History of pituitary adenoma, Hypertension, Hypothyroidism, OSA (obstructive sleep apnea), and Seizure disorder ([place]) (05/31/2017). here with:  1.  Seizures -Continues to do quite well -Continue Keppra 500 mg  twice a day -Given printed prescription today -Call for seizure activity, follow-up in 1 year  I spent 20 minutes of face-to-face and non-face-to-face time with patient.  This included previsit chart review, lab review, study review, order entry, electronic health record documentation, patient education.  Butler Denmark, AGNP-C, DNP 01/16/2020, 11:05 AM [facility] [address] [place], [place] [address] [phone]

## 2020-01-16 NOTE — Patient Instructions (Signed)
You look great today! Continue keppra at current dosing Will give you a printed rx  See you back in 1 year

## 2020-01-17 NOTE — Progress Notes (Signed)
I have read the note, and I agree with the clinical assessment and plan.  Samreet Edenfield K Darald Uzzle   

## 2020-01-30 DIAGNOSIS — R569 Unspecified convulsions: Secondary | ICD-10-CM | POA: Diagnosis not present

## 2020-01-30 DIAGNOSIS — E782 Mixed hyperlipidemia: Secondary | ICD-10-CM | POA: Diagnosis not present

## 2020-01-30 DIAGNOSIS — R319 Hematuria, unspecified: Secondary | ICD-10-CM | POA: Diagnosis not present

## 2020-01-30 DIAGNOSIS — N529 Male erectile dysfunction, unspecified: Secondary | ICD-10-CM | POA: Diagnosis not present

## 2020-01-30 DIAGNOSIS — D631 Anemia in chronic kidney disease: Secondary | ICD-10-CM | POA: Diagnosis not present

## 2020-01-30 DIAGNOSIS — N189 Chronic kidney disease, unspecified: Secondary | ICD-10-CM | POA: Diagnosis not present

## 2020-01-30 DIAGNOSIS — R809 Proteinuria, unspecified: Secondary | ICD-10-CM | POA: Diagnosis not present

## 2020-01-30 DIAGNOSIS — E038 Other specified hypothyroidism: Secondary | ICD-10-CM | POA: Diagnosis not present

## 2020-01-30 DIAGNOSIS — E2749 Other adrenocortical insufficiency: Secondary | ICD-10-CM | POA: Diagnosis not present

## 2020-01-30 DIAGNOSIS — Z23 Encounter for immunization: Secondary | ICD-10-CM | POA: Diagnosis not present

## 2020-01-30 DIAGNOSIS — I129 Hypertensive chronic kidney disease with stage 1 through stage 4 chronic kidney disease, or unspecified chronic kidney disease: Secondary | ICD-10-CM | POA: Diagnosis not present

## 2020-01-30 DIAGNOSIS — N2581 Secondary hyperparathyroidism of renal origin: Secondary | ICD-10-CM | POA: Diagnosis not present

## 2020-01-30 DIAGNOSIS — N1831 Chronic kidney disease, stage 3a: Secondary | ICD-10-CM | POA: Diagnosis not present

## 2020-02-13 DIAGNOSIS — N189 Chronic kidney disease, unspecified: Secondary | ICD-10-CM | POA: Diagnosis not present

## 2020-02-13 DIAGNOSIS — I129 Hypertensive chronic kidney disease with stage 1 through stage 4 chronic kidney disease, or unspecified chronic kidney disease: Secondary | ICD-10-CM | POA: Diagnosis not present

## 2020-03-22 ENCOUNTER — Ambulatory Visit: Payer: Medicare Other | Attending: Internal Medicine

## 2020-03-22 DIAGNOSIS — Z23 Encounter for immunization: Secondary | ICD-10-CM

## 2020-03-22 NOTE — Progress Notes (Signed)
   Covid-19 Vaccination Clinic  Name:  Trevor Kelley    MRN: [id number] DOB: June 07, 1950  03/22/2020  Trevor Kelley was observed post Covid-19 immunization for 15 minutes without incident. He was provided with Vaccine Information Sheet and instruction to access the V-Safe system.   Trevor Kelley was instructed to call 911 with any severe reactions post vaccine: Marland Kitchen Difficulty breathing  . Swelling of face and throat  . A fast heartbeat  . A bad rash all over body  . Dizziness and weakness   Immunizations Administered    Name Date Dose VIS Date Route   Pfizer COVID-19 Vaccine 03/22/2020  3:28 PM 0.3 mL 02/27/2020 Intramuscular   Manufacturer: [place]   Lot: [id number]   [place]: [address]-7

## 2020-03-27 DIAGNOSIS — H53433 Sector or arcuate defects, bilateral: Secondary | ICD-10-CM | POA: Diagnosis not present

## 2020-03-27 DIAGNOSIS — E119 Type 2 diabetes mellitus without complications: Secondary | ICD-10-CM | POA: Diagnosis not present

## 2020-03-27 DIAGNOSIS — H2513 Age-related nuclear cataract, bilateral: Secondary | ICD-10-CM | POA: Diagnosis not present

## 2020-03-27 DIAGNOSIS — H401132 Primary open-angle glaucoma, bilateral, moderate stage: Secondary | ICD-10-CM | POA: Diagnosis not present

## 2020-04-15 DIAGNOSIS — E1169 Type 2 diabetes mellitus with other specified complication: Secondary | ICD-10-CM | POA: Diagnosis not present

## 2020-04-15 DIAGNOSIS — E669 Obesity, unspecified: Secondary | ICD-10-CM | POA: Diagnosis not present

## 2020-04-15 DIAGNOSIS — E1122 Type 2 diabetes mellitus with diabetic chronic kidney disease: Secondary | ICD-10-CM | POA: Diagnosis not present

## 2020-04-15 DIAGNOSIS — N1831 Chronic kidney disease, stage 3a: Secondary | ICD-10-CM | POA: Diagnosis not present

## 2020-04-15 DIAGNOSIS — E2749 Other adrenocortical insufficiency: Secondary | ICD-10-CM | POA: Diagnosis not present

## 2020-04-15 DIAGNOSIS — Z6833 Body mass index (BMI) 33.0-33.9, adult: Secondary | ICD-10-CM | POA: Diagnosis not present

## 2020-04-15 DIAGNOSIS — E230 Hypopituitarism: Secondary | ICD-10-CM | POA: Diagnosis not present

## 2020-04-18 DIAGNOSIS — E669 Obesity, unspecified: Secondary | ICD-10-CM | POA: Diagnosis not present

## 2020-04-18 DIAGNOSIS — E1169 Type 2 diabetes mellitus with other specified complication: Secondary | ICD-10-CM | POA: Diagnosis not present

## 2020-04-24 DIAGNOSIS — Z86018 Personal history of other benign neoplasm: Secondary | ICD-10-CM | POA: Diagnosis not present

## 2020-04-24 DIAGNOSIS — E038 Other specified hypothyroidism: Secondary | ICD-10-CM | POA: Diagnosis not present

## 2020-04-24 DIAGNOSIS — Z86011 Personal history of benign neoplasm of the brain: Secondary | ICD-10-CM | POA: Diagnosis not present

## 2020-04-24 DIAGNOSIS — Z8639 Personal history of other endocrine, nutritional and metabolic disease: Secondary | ICD-10-CM | POA: Diagnosis not present

## 2020-04-24 DIAGNOSIS — Z48811 Encounter for surgical aftercare following surgery on the nervous system: Secondary | ICD-10-CM | POA: Diagnosis not present

## 2020-04-24 DIAGNOSIS — Z9889 Other specified postprocedural states: Secondary | ICD-10-CM | POA: Diagnosis not present

## 2020-05-20 DIAGNOSIS — E119 Type 2 diabetes mellitus without complications: Secondary | ICD-10-CM | POA: Diagnosis not present

## 2020-05-28 DIAGNOSIS — N1831 Chronic kidney disease, stage 3a: Secondary | ICD-10-CM | POA: Diagnosis not present

## 2020-06-03 DIAGNOSIS — I129 Hypertensive chronic kidney disease with stage 1 through stage 4 chronic kidney disease, or unspecified chronic kidney disease: Secondary | ICD-10-CM | POA: Diagnosis not present

## 2020-06-03 DIAGNOSIS — N2581 Secondary hyperparathyroidism of renal origin: Secondary | ICD-10-CM | POA: Diagnosis not present

## 2020-06-03 DIAGNOSIS — D631 Anemia in chronic kidney disease: Secondary | ICD-10-CM | POA: Diagnosis not present

## 2020-06-03 DIAGNOSIS — N1831 Chronic kidney disease, stage 3a: Secondary | ICD-10-CM | POA: Diagnosis not present

## 2020-06-23 ENCOUNTER — Ambulatory Visit (HOSPITAL_COMMUNITY)
Admission: EM | Admit: 2020-06-23 | Discharge: 2020-06-23 | Disposition: A | Discharge status: Other Acute Inpatient Hospital | Payer: Medicare Other | Attending: Emergency Medicine | Admitting: Emergency Medicine

## 2020-06-23 ENCOUNTER — Other Ambulatory Visit: Payer: Self-pay

## 2020-06-23 ENCOUNTER — Emergency Department (HOSPITAL_BASED_OUTPATIENT_CLINIC_OR_DEPARTMENT_OTHER): Payer: Medicare Other

## 2020-06-23 ENCOUNTER — Emergency Department (HOSPITAL_COMMUNITY)
Admission: EM | Admit: 2020-06-23 | Discharge: 2020-06-24 | Disposition: A | Discharge status: Home / Self Care | Payer: Medicare Other | Attending: Emergency Medicine | Admitting: Emergency Medicine

## 2020-06-23 ENCOUNTER — Encounter (HOSPITAL_COMMUNITY): Payer: Self-pay | Admitting: *Deleted

## 2020-06-23 ENCOUNTER — Emergency Department (HOSPITAL_COMMUNITY): Payer: Medicare Other

## 2020-06-23 DIAGNOSIS — R000 Tachycardia, unspecified: Secondary | ICD-10-CM | POA: Diagnosis not present

## 2020-06-23 DIAGNOSIS — M109 Gout, unspecified: Secondary | ICD-10-CM | POA: Diagnosis not present

## 2020-06-23 DIAGNOSIS — Z20822 Contact with and (suspected) exposure to covid-19: Secondary | ICD-10-CM | POA: Diagnosis not present

## 2020-06-23 DIAGNOSIS — I129 Hypertensive chronic kidney disease with stage 1 through stage 4 chronic kidney disease, or unspecified chronic kidney disease: Secondary | ICD-10-CM | POA: Insufficient documentation

## 2020-06-23 DIAGNOSIS — E039 Hypothyroidism, unspecified: Secondary | ICD-10-CM | POA: Diagnosis not present

## 2020-06-23 DIAGNOSIS — E1122 Type 2 diabetes mellitus with diabetic chronic kidney disease: Secondary | ICD-10-CM | POA: Diagnosis not present

## 2020-06-23 DIAGNOSIS — Z79899 Other long term (current) drug therapy: Secondary | ICD-10-CM | POA: Diagnosis not present

## 2020-06-23 DIAGNOSIS — M10061 Idiopathic gout, right knee: Secondary | ICD-10-CM | POA: Diagnosis not present

## 2020-06-23 DIAGNOSIS — M25561 Pain in right knee: Secondary | ICD-10-CM

## 2020-06-23 DIAGNOSIS — N183 Chronic kidney disease, stage 3 unspecified: Secondary | ICD-10-CM | POA: Insufficient documentation

## 2020-06-23 DIAGNOSIS — M25461 Effusion, right knee: Secondary | ICD-10-CM | POA: Insufficient documentation

## 2020-06-23 DIAGNOSIS — R509 Fever, unspecified: Secondary | ICD-10-CM

## 2020-06-23 LAB — CBC WITH DIFFERENTIAL/PLATELET
Abs Immature Granulocytes: 0.05 10*3/uL (ref 0.00–0.07)
Basophils Absolute: 0 10*3/uL (ref 0.0–0.1)
Basophils Relative: 0 %
Eosinophils Absolute: 0.1 10*3/uL (ref 0.0–0.5)
Eosinophils Relative: 1 %
HCT: 34.3 % — ABNORMAL LOW (ref 39.0–52.0)
Hemoglobin: 11.2 g/dL — ABNORMAL LOW (ref 13.0–17.0)
Immature Granulocytes: 1 %
Lymphocytes Relative: 26 %
Lymphs Abs: 2.4 10*3/uL (ref 0.7–4.0)
MCH: 25.5 pg — ABNORMAL LOW (ref 26.0–34.0)
MCHC: 32.7 g/dL (ref 30.0–36.0)
MCV: 78.1 fL — ABNORMAL LOW (ref 80.0–100.0)
Monocytes Absolute: 1.5 10*3/uL — ABNORMAL HIGH (ref 0.1–1.0)
Monocytes Relative: 16 %
Neutro Abs: 5.3 10*3/uL (ref 1.7–7.7)
Neutrophils Relative %: 56 %
Platelets: 180 10*3/uL (ref 150–400)
RBC: 4.39 MIL/uL (ref 4.22–5.81)
RDW: 14 % (ref 11.5–15.5)
WBC: 9.5 10*3/uL (ref 4.0–10.5)
nRBC: 0 % (ref 0.0–0.2)

## 2020-06-23 LAB — SYNOVIAL CELL COUNT + DIFF, W/ CRYSTALS
Eosinophils-Synovial: 0 % (ref 0–1)
Lymphocytes-Synovial Fld: 3 % (ref 0–20)
Monocyte-Macrophage-Synovial Fluid: 13 % — ABNORMAL LOW (ref 50–90)
Neutrophil, Synovial: 84 % — ABNORMAL HIGH (ref 0–25)
WBC, Synovial: 25250 /mm3 — ABNORMAL HIGH (ref 0–200)

## 2020-06-23 LAB — RESP PANEL BY RT-PCR (FLU A&B, COVID) ARPGX2
Influenza A by PCR: NEGATIVE
Influenza B by PCR: NEGATIVE
SARS Coronavirus 2 by RT PCR: NEGATIVE

## 2020-06-23 LAB — CBG MONITORING, ED: Glucose-Capillary: 105 mg/dL — ABNORMAL HIGH (ref 70–99)

## 2020-06-23 LAB — BASIC METABOLIC PANEL
Anion gap: 10 (ref 5–15)
BUN: 15 mg/dL (ref 8–23)
CO2: 26 mmol/L (ref 22–32)
Calcium: 8.5 mg/dL — ABNORMAL LOW (ref 8.9–10.3)
Chloride: 101 mmol/L (ref 98–111)
Creatinine, Ser: 2.33 mg/dL — ABNORMAL HIGH (ref 0.61–1.24)
GFR, Estimated: 29 mL/min — ABNORMAL LOW (ref >60)
Glucose, Bld: 118 mg/dL — ABNORMAL HIGH (ref 70–99)
Potassium: 2.9 mmol/L — ABNORMAL LOW (ref 3.5–5.1)
Sodium: 137 mmol/L (ref 135–145)

## 2020-06-23 MED ORDER — LACTATED RINGERS IV BOLUS
1000.0000 mL | Freq: Once | INTRAVENOUS | Status: AC
  Administered 2020-06-23: 1000 mL via INTRAVENOUS

## 2020-06-23 MED ORDER — ACETAMINOPHEN 325 MG PO TABS
650.0000 mg | ORAL_TABLET | Freq: Once | ORAL | Status: AC
  Administered 2020-06-23: 650 mg via ORAL

## 2020-06-23 MED ORDER — COLCHICINE 0.6 MG PO TABS
1.2000 mg | ORAL_TABLET | ORAL | Status: AC
  Administered 2020-06-23: 1.2 mg via ORAL
  Filled 2020-06-23: qty 2

## 2020-06-23 MED ORDER — ACETAMINOPHEN 325 MG PO TABS
ORAL_TABLET | ORAL | Status: AC
  Filled 2020-06-23: qty 2

## 2020-06-23 MED ORDER — POTASSIUM & SODIUM PHOSPHATES 280-160-250 MG PO PACK
1.0000 | PACK | Freq: Once | ORAL | Status: AC
  Administered 2020-06-23: 1 via ORAL
  Filled 2020-06-23: qty 1

## 2020-06-23 MED ORDER — LIDOCAINE-EPINEPHRINE (PF) 2 %-1:200000 IJ SOLN
10.0000 mL | Freq: Once | INTRAMUSCULAR | Status: AC
  Administered 2020-06-23: 10 mL via INTRADERMAL
  Filled 2020-06-23: qty 10

## 2020-06-23 MED ORDER — PREDNISONE 20 MG PO TABS: 40.0000 mg | ORAL_TABLET | Freq: Every day | ORAL | 0 refills | Status: DC

## 2020-06-23 MED ORDER — COLCHICINE 0.6 MG PO TABS: 0.6000 mg | ORAL_TABLET | Freq: Two times a day (BID) | ORAL | 0 refills | Status: DC

## 2020-06-23 NOTE — ED Notes (Signed)
Patient transported to X-ray 

## 2020-06-23 NOTE — ED Provider Notes (Signed)
I saw and evaluated the patient, reviewed the resident's note and I agree with the findings and plan.  Pertinent History: This patient presents with several days of right knee pain, becoming more swollen, today he has a fever of 101.  Sent from urgent care because of hypotension tachycardia and significant knee pain.  He denies any recent surgeries or trauma  Pertinent Exam findings: On exam the patient has an effusion of the right knee, there is no overlying redness, there is also a fullness in the popliteal space.  On my exam the patient has a blood pressure of 115/67, he is tachycardic to 112 with a temperature of 101.  As most joint infections require some kind of specimen prior to antibiotics we will do the arthrocentesis prior to ordering the antibiotics.  This is general orthopedic protocol  I was personally present and directly supervised the following procedures:  Arthrocentesis Medical evaluation Treatment of possible infection  I discussed the case with Dr. Lucia Gaskins of the orthopedic service who recommends that if the Gram stain is negative the patient can be discharged with treatment for gout, if positive will need to have a washout and antibiotics.  Thankfully the Gram stain is negative at this time, awaiting culture results which will not come back today.  He recommends the patient can follow-up in the orthopedic office.  I think this is reasonable.  He is no longer febrile, temperature 98.9 without medications, blood pressure 149/94.  He will be given pain medications appropriate gout medications and close follow-up.  The patient is agreeable  Medications  lactated ringers bolus 1,000 mL (0 mLs Intravenous Stopped 06/23/20 1959)  lidocaine-EPINEPHrine (XYLOCAINE W/EPI) 2 %-1:200000 (PF) injection 10 mL (10 mLs Intradermal Given 06/23/20 1650)  potassium & sodium phosphates (PHOS-NAK) 280-160-250 MG packet 1 packet (1 packet Oral Given 06/23/20 1955)  lactated ringers bolus 1,000 mL (0  mLs Intravenous Stopped 06/23/20 2302)  colchicine tablet 1.2 mg (1.2 mg Oral Given 06/23/20 2302)  acetaminophen (TYLENOL) tablet 1,000 mg (1,000 mg Oral Given 06/24/20 0009)   I personally interpreted the EKG as well as the resident and agree with the interpretation on the resident's chart.  Final diagnoses:  Acute gout of right knee, unspecified cause      Noemi Chapel, MD 06/24/20 1450

## 2020-06-23 NOTE — Discharge Instructions (Addendum)
Your testing today shows that you likely have gout.  Chance that there may be an infection however we do not have the results of that for another day or 2.  We will contact you if you need to be seen in the emergency department again.  Please follow-up with Dr. Lucia Gaskins in the clinic, in 2 to 3 days  ER for worsening symptoms

## 2020-06-23 NOTE — ED Provider Notes (Signed)
[facility] EMERGENCY DEPARTMENT Provider Note   CSN: [id number] Arrival date & time: 06/23/20  1434     History Chief Complaint  Patient presents with  . Leg Pain    Trevor Kelley is a 70 y.o. male with h/o T2DM, HTN, seizures, and CKD stage 3 who presents to the ED for R knee pain. Sharp pain came on gradually 3 days ago and progressively worsening since onset. No recent trauma or procedures. Seen at UC earlier today where he was noted to be tachycardic and febrile. Sent to [facility] ED for further evaluation. Pain worse with bearing weight or twisting knee. Alleviated with rest. No previous injuries to R knee. Denies fever, chills, proximal streaking, redness, or urinary symptoms.  The history is provided by the patient and medical records.  Knee Pain Location:  Knee Time since incident:  3 days Injury: no   Knee location:  R knee Pain details:    Quality:  Sharp   Radiates to:  R leg   Severity:  Moderate   Onset quality:  Gradual   Duration:  3 days   Timing:  Constant   Progression:  Worsening Chronicity:  New Dislocation: no   Foreign body present:  No foreign bodies Prior injury to area:  No Relieved by:  Rest Worsened by:  Extension, flexion and bearing weight Ineffective treatments:  Acetaminophen Associated symptoms: decreased ROM, fever, stiffness and swelling   Associated symptoms: no back pain and no numbness   Risk factors: obesity        Past Medical History:  Diagnosis Date  . CKD (chronic kidney disease)   . Diabetes mellitus without complication ([place])   . History of pituitary adenoma   . Hypertension   . Hypothyroidism   . OSA (obstructive sleep apnea)   . Seizure disorder ([place]) 05/31/2017    Patient Active Problem List   Diagnosis Date Noted  . Seizure disorder ([place]) 05/31/2017  . AKI (acute kidney injury) ([place]) 01/03/2017  . Thrombocytopenia ([place]) 01/01/2017  . Hypokalemia 01/01/2017  . Sepsis ([place]) 12/31/2016   . Cellulitis 12/31/2016  . Diarrhea 12/31/2016  . Anemia 12/31/2016  . OSA (obstructive sleep apnea) 12/03/2014  . Pituitary adenoma ([place]) 02/12/2014  . Diabetes mellitus type 2 in obese ([place]) 02/05/2014  . Obesity (BMI 30-39.9) 02/05/2014  . 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 BRAIN SURGERY         Family History  Problem Relation Age of Onset  . Hypothyroidism Other   . Diabetes Mellitus II Other   . Cancer Other   . Hypertension Other     Social History   Tobacco Use  . Smoking status: Never Smoker  . Smokeless tobacco: Never Used  Vaping Use  . Vaping Use: Never used  Substance Use Topics  . Alcohol use: No  . Drug use: No    Home Medications Prior to Admission medications   Medication Sig Start Date End Date Taking? Authorizing Provider  colchicine 0.6 MG tablet Take 1 tablet (0.6 mg total) by mouth 2 (two) times daily for 10 days. 06/23/20 07/03/20 Yes Noemi Chapel, MD  predniSONE (DELTASONE) 20 MG tablet Take 2 tablets (40 mg total) by mouth daily. 06/23/20  Yes Noemi Chapel, MD  amLODipine (NORVASC) 10 MG tablet Take 10 mg by mouth 2 (two) times daily.  [provider]  calcitRIOL (ROCALTROL) 0.25 MCG capsule Take 0.25 mcg by mouth daily.    [provider]  furosemide (LASIX) 80 MG tablet Take 80 mg by mouth daily. 01/09/19   [provider]  hydrocortisone (CORTEF) 10 MG tablet Take 10-15 mg by mouth See admin instructions. Take 15 mg  tablets every morning and take 10 mg tablet daily 02/25/17   [provider]  latanoprost (XALATAN) 0.005 % ophthalmic solution SMARTSIG:1 Drop(s) In Eye(s) Every Evening 01/07/20   [provider]  levETIRAcetam (KEPPRA) 500 MG tablet Take 1 tablet (500 mg total) by mouth 2 (two) times daily. 01/16/20   Suzzanne Cloud, NP   levothyroxine (SYNTHROID, LEVOTHROID) 88 MCG tablet Take 88 mcg by mouth daily before breakfast.    [provider]  LUMIGAN 0.01 % SOLN Place 1 drop into both eyes at bedtime.  11/08/17   [provider]  simvastatin (ZOCOR) 20 MG tablet Take 20 mg by mouth daily at 6 PM.     [provider]  tamsulosin (FLOMAX) 0.4 MG CAPS capsule Take 0.4 mg by mouth daily.     [provider]  timolol (TIMOPTIC) 0.5 % ophthalmic solution Place 1 drop into both eyes daily.  10/26/16   [provider]    Allergies    Patient has no known allergies.  Review of Systems   Review of Systems  Constitutional: Positive for fever. Negative for chills.  HENT: Negative for ear pain and sore throat.   Eyes: Negative for pain and visual disturbance.  Respiratory: Negative for cough and shortness of breath.   Cardiovascular: Negative for chest pain and palpitations.  Gastrointestinal: Negative for abdominal pain and vomiting.  Genitourinary: Negative for dysuria and hematuria.  Musculoskeletal: Positive for stiffness. Negative for arthralgias and back pain.  Skin: Negative for color change and rash.  Neurological: Negative for seizures and syncope.  All other systems reviewed and are negative.   Physical Exam Updated Vital Signs BP 138/78 (BP Location: Right Arm)   Pulse 77   Temp 98.9 F (37.2 C) (Oral)   Resp (!) 22   Ht 6' (1.829 m)   Wt 111.1 kg   SpO2 99%   BMI 33.23 kg/m   Physical Exam Vitals and nursing note reviewed.  Constitutional:      General: He is awake. He is not in acute distress.    Appearance: Normal appearance. He is well-developed, well-groomed and well-nourished. He is not ill-appearing.  HENT:     Head: Normocephalic and atraumatic.     Right Ear: External ear normal.     Left Ear: External ear normal.     Nose: Nose normal. No congestion or rhinorrhea.     Mouth/Throat:     Mouth: Mucous membranes are moist.     Pharynx:  Oropharynx is clear. No oropharyngeal exudate or posterior oropharyngeal erythema.  Eyes:     General: No scleral icterus.       Right eye: No discharge.        Left eye: No discharge.     Conjunctiva/sclera: Conjunctivae normal.  Cardiovascular:     Rate and Rhythm: Normal rate and regular rhythm.     Pulses: Normal pulses.          Radial pulses are 2+ on the right side and 2+ on the left side.       Dorsalis pedis pulses are 2+ on the right side and 2+ on the left side.  Heart sounds: Normal heart sounds. No murmur heard.   Pulmonary:     Effort: Pulmonary effort is normal. No respiratory distress.     Breath sounds: Normal breath sounds. No wheezing, rhonchi or rales.  Abdominal:     General: Abdomen is flat. There is no distension.     Palpations: Abdomen is soft.     Tenderness: There is no abdominal tenderness. There is no guarding or rebound.  Musculoskeletal:        General: Swelling and tenderness present.     Cervical back: Neck supple.     Right lower leg: 1+ Pitting Edema present.     Left lower leg: 1+ Pitting Edema present.     Comments: R knee joint effusion without overlying skin changes. Palpable tender mass noted to popliteal fossa. ROM limited 2/2 pain. NVI distally with 2+ DP pulse.  Skin:    General: Skin is warm and dry.  Neurological:     General: No focal deficit present.     Mental Status: He is alert and oriented to person, place, and time.     Sensory: No sensory deficit.     Motor: No weakness.  Psychiatric:        Mood and Affect: Mood and affect and mood normal.        Behavior: Behavior normal. Behavior is cooperative.     ED Results / Procedures / Treatments   Labs (all labs ordered are listed, but only abnormal results are displayed) Labs Reviewed  SYNOVIAL CELL COUNT + DIFF, W/ CRYSTALS - Abnormal; Notable for the following components:      Result Value   Appearance-Synovial TURBID (*)    WBC, Synovial 25,250 (*)    Neutrophil,  Synovial 84 (*)    Monocyte-Macrophage-Synovial Fluid 13 (*)    All other components within normal limits  CBC WITH DIFFERENTIAL/PLATELET - Abnormal; Notable for the following components:   Hemoglobin 11.2 (*)    HCT 34.3 (*)    MCV 78.1 (*)    MCH 25.5 (*)    Monocytes Absolute 1.5 (*)    All other components within normal limits  BASIC METABOLIC PANEL - Abnormal; Notable for the following components:   Potassium 2.9 (*)    Glucose, Bld 118 (*)    Creatinine, Ser 2.33 (*)    Calcium 8.5 (*)    GFR, Estimated 29 (*)    All other components within normal limits  CBG MONITORING, ED - Abnormal; Notable for the following components:   Glucose-Capillary 105 (*)    All other components within normal limits  RESP PANEL BY RT-PCR (FLU A&B, COVID) ARPGX2  BODY FLUID CULTURE W GRAM STAIN  BODY FLUID CULTURE  GLUCOSE, BODY FLUID OTHER  C-REACTIVE PROTEIN  SEDIMENTATION RATE    EKG None  Radiology DG Knee Complete 4 Views Right  Result Date: 06/23/2020 CLINICAL DATA:  Right knee pain and swelling posteriorly after twisting injury. EXAM: RIGHT KNEE - COMPLETE 4+ VIEW COMPARISON:  None. FINDINGS: Small to moderate suprapatellar joint effusion. No acute fracture or dislocation. Enthesophyte at the quadriceps insertion. IMPRESSION: Suprapatellar joint effusion. Electronically Signed   By: Abigail Miyamoto M.D.   On: 06/23/2020 16:51   VAS [place] LOWER EXTREMITY VENOUS (DVT) (ONLY [facility] & [facility] 7a-7p)  Result Date: 06/23/2020  Lower Venous DVT Study Indications: Pain behind RT knee.  Comparison Study: 12-31-2016 RT lower extremity venous study was WNL. Performing Technologist: Darlin Coco RDMS  Examination Guidelines: A complete evaluation includes B-mode  imaging, spectral Doppler, color Doppler, and power Doppler as needed of all accessible portions of each vessel. Bilateral testing is considered an integral part of a complete examination. Limited examinations for reoccurring indications may be  performed as noted. The reflux portion of the exam is performed with the patient in reverse Trendelenburg.  +---------+---------------+---------+-----------+----------+--------------+ RIGHT    CompressibilityPhasicitySpontaneityPropertiesThrombus Aging +---------+---------------+---------+-----------+----------+--------------+ CFV      Full           Yes      Yes                                 +---------+---------------+---------+-----------+----------+--------------+ SFJ      Full                                                        +---------+---------------+---------+-----------+----------+--------------+ FV Prox  Full                                                        +---------+---------------+---------+-----------+----------+--------------+ FV Mid   Full                                                        +---------+---------------+---------+-----------+----------+--------------+ FV DistalFull                                                        +---------+---------------+---------+-----------+----------+--------------+ PFV      Full                                                        +---------+---------------+---------+-----------+----------+--------------+ POP      Full           Yes      Yes                                 +---------+---------------+---------+-----------+----------+--------------+ PTV      Full                                                        +---------+---------------+---------+-----------+----------+--------------+ PERO     Full                                                        +---------+---------------+---------+-----------+----------+--------------+   +----+---------------+---------+-----------+----------+--------------+  LEFTCompressibilityPhasicitySpontaneityPropertiesThrombus Aging +----+---------------+---------+-----------+----------+--------------+ CFV Full           Yes       Yes                                 +----+---------------+---------+-----------+----------+--------------+     Summary: RIGHT: - There is no evidence of deep vein thrombosis in the lower extremity.  - A cystic structure measuring 4.7 cm is found in the popliteal fossa.  LEFT: - No evidence of common femoral vein obstruction.  *See table(s) above for measurements and observations.    Preliminary     Procedures .Joint Aspiration/Arthrocentesis  Date/Time: 06/23/2020 6:19 PM Performed by: Christy Gentles, MD Authorized by: Noemi Chapel, MD   Consent:    Consent obtained:  Written and verbal   Consent given by:  Patient   Risks, benefits, and alternatives were discussed: yes     Risks discussed:  Bleeding, infection, nerve damage, pain and incomplete drainage   Alternatives discussed:  No treatment Universal protocol:    Procedure explained and questions answered to patient or proxy's satisfaction: yes     Relevant documents present and verified: yes     Test results available: yes     Imaging studies available: yes     Site/side marked: yes     Immediately prior to procedure, a time out was called: yes     Patient identity confirmed:  Verbally with patient and arm band Location:    Location:  Knee   Knee:  R knee Anesthesia:    Anesthesia method:  Local infiltration   Local anesthetic:  Lidocaine 2% WITH epi Procedure details:    Preparation: Patient was prepped and draped in usual sterile fashion     Needle gauge:  18 G   Ultrasound guidance: no     Approach:  Lateral   Aspirate amount:  15   Aspirate characteristics:  Cloudy and yellow   Steroid injected: no     Specimen collected: yes   Post-procedure details:    Dressing:  Adhesive bandage   Procedure completion:  Tolerated well, no immediate complications   Medications Ordered in ED Medications  lidocaine (LIDODERM) 5 % 1 patch (1 patch Transdermal Patch Applied 06/24/20 0009)  lactated ringers bolus 1,000 mL (0 mLs  Intravenous Stopped 06/23/20 1959)  lidocaine-EPINEPHrine (XYLOCAINE W/EPI) 2 %-1:200000 (PF) injection 10 mL (10 mLs Intradermal Given 06/23/20 1650)  potassium & sodium phosphates (PHOS-NAK) 280-160-250 MG packet 1 packet (1 packet Oral Given 06/23/20 1955)  lactated ringers bolus 1,000 mL (0 mLs Intravenous Stopped 06/23/20 2302)  colchicine tablet 1.2 mg (1.2 mg Oral Given 06/23/20 2302)  acetaminophen (TYLENOL) tablet 1,000 mg (1,000 mg Oral Given 06/24/20 0009)    ED Course  I have reviewed the triage vital signs and the nursing notes.  Pertinent labs & imaging results that were available during my care of the patient were reviewed by me and considered in my medical decision making (see chart for details).    MDM Rules/Calculators/A&P                          Patient is a 42yoM with history and physical as described above who presents to the ED for R knee pain and swelling.  SBP 110s and HDS. Afebrile in ED. Patient resting comfortably in no acute distress. Initial workup includes CBC, CMP, plain films of R knee,  RLE DVT US, and diagnostic arthrocentesis. Initial treatment includes IVF bolus.  Plain films unremarkable. DVT US demonstrated popliteal cyst but no DVT. Synovial fluid as noted above. Discussed patient with Orthopedics who stated if gram stain negative, patient can be discharged home. If positive, patient would need admission for joint washout. Gram stain negative in ED. Will discharge home with Colchicine and Prednisone for presumed gout. Patient will follow up in Ortho office outpatient. Patient otherwise remained HDS with no further acute events while in ED.  Strict return precautions provided and discussed. Questions and concerns were addressed. Patient verbalized understanding and amenable with discharge plan. Crutches provided for comfort. Patient discharged in stable condition.  Final Clinical Impression(s) / ED Diagnoses Final diagnoses:  Acute gout of right knee,  unspecified cause    Rx / DC Orders ED Discharge Orders         Ordered    colchicine 0.6 MG tablet  2 times daily        06/23/20 2252    predniSONE (DELTASONE) 20 MG tablet  Daily        06/23/20 2252           Christy Gentles, MD 06/24/20 9536    Noemi Chapel, MD 06/24/20 1451

## 2020-06-23 NOTE — ED Provider Notes (Addendum)
HPI  SUBJECTIVE:  Trevor Kelley is a 70 y.o. male who presents with 2 days of right popliteal fossa pain described as sharp, constant.  States that he is unable to walk on it secondary to the pain.  He reports knee swelling, lower extremity erythema.  He states that he has just started going to the gym.  He states his symptoms started off as swelling, pain in his right first MTP.  It resolved and then migrated to his posterior knee.  No trauma to the knee or extremity.  No skin breaks, calf pain, lower extremity edema.  No fevers, body aches, nausea, vomiting, chest pain, shortness of breath, cough, hemoptysis, recent immobilization.  He tried heat, ice, elevation and rest.  Elevation and rest help.  He has a past medical history of hypertension, diabetes.  States that his glucose has been well controlled in the 120s recently.  Also has a history of chronic kidney disease stage IV, seizures, pituitary adenoma.  No history of gout, PE, DVT, cancer, hypercoagulability.  PMD: Nephrology at [facility].    Past Medical History:  Diagnosis Date  . CKD (chronic kidney disease)   . Diabetes mellitus without complication ([place])   . History of pituitary adenoma   . Hypertension   . Hypothyroidism   . OSA (obstructive sleep apnea)   . Seizure disorder ([place]) 05/31/2017    Past Surgical History:  Procedure Laterality Date  . BRAIN SURGERY      Family History  Problem Relation Age of Onset  . Hypothyroidism Other   . Diabetes Mellitus II Other   . Cancer Other   . Hypertension Other     Social History   Tobacco Use  . Smoking status: Never Smoker  . Smokeless tobacco: Never Used  Vaping Use  . Vaping Use: Never used  Substance Use Topics  . Alcohol use: No  . Drug use: No    No current facility-administered medications for this encounter.  Current Outpatient Medications:  .  amLODipine (NORVASC) 10 MG tablet, Take 10 mg by mouth 2 (two) times daily. , Disp: , Rfl:  .   calcitRIOL (ROCALTROL) 0.25 MCG capsule, Take 0.25 mcg by mouth daily., Disp: , Rfl:  .  furosemide (LASIX) 80 MG tablet, Take 80 mg by mouth daily., Disp: , Rfl:  .  hydrocortisone (CORTEF) 10 MG tablet, Take 10-15 mg by mouth See admin instructions. Take 15 mg  tablets every morning and take 10 mg tablet daily, Disp: , Rfl:  .  latanoprost (XALATAN) 0.005 % ophthalmic solution, SMARTSIG:1 Drop(s) In Eye(s) Every Evening, Disp: , Rfl:  .  levETIRAcetam (KEPPRA) 500 MG tablet, Take 1 tablet (500 mg total) by mouth 2 (two) times daily., Disp: 180 tablet, Rfl: 4 .  levothyroxine (SYNTHROID, LEVOTHROID) 88 MCG tablet, Take 88 mcg by mouth daily before breakfast., Disp: , Rfl:  .  LUMIGAN 0.01 % SOLN, Place 1 drop into both eyes at bedtime. , Disp: , Rfl: 3 .  simvastatin (ZOCOR) 20 MG tablet, Take 20 mg by mouth daily at 6 PM. , Disp: , Rfl:  .  tamsulosin (FLOMAX) 0.4 MG CAPS capsule, Take 0.4 mg by mouth daily. , Disp: , Rfl:  .  timolol (TIMOPTIC) 0.5 % ophthalmic solution, Place 1 drop into both eyes daily. , Disp: , Rfl:   No Known Allergies   ROS  As noted in HPI.   Physical Exam  BP 112/77 (BP Location: Right Arm)   Pulse Marland Kitchen)  125   Temp (!) 100.8 F (38.2 C) (Oral)   Resp 20   SpO2 100%   Constitutional: Well developed, well nourished, no acute distress Eyes:  EOMI, conjunctiva normal bilaterally HENT: Normocephalic, atraumatic,mucus membranes moist Respiratory: Normal inspiratory effort Cardiovascular: Normal rate GI: nondistended skin: No rash, skin intact Musculoskeletal: Tender swelling, increased temperature right knee.  Mild pain with range of motion.  Tenderness maximal in the popliteal fossa.  1+ pitting edema right lower extremity, right foot swollen, erythematous.  Skin intact over the foot, right lower extremity.  DP 2+ and equal bilaterally.  Sensation distally intact.  No tenderness over the foot, ankle. Neurologic: Alert & oriented x 3, no focal neuro  deficits Psychiatric: Speech and behavior appropriate   ED Course   Medications  acetaminophen (TYLENOL) tablet 650 mg (650 mg Oral Given 06/23/20 1346)    No orders of the defined types were placed in this encounter.   No results found for this or any previous visit (from the past 24 hour(s)). No results found.  ED Clinical Impression  1. Acute pain of right knee   2. Fever, unspecified fever cause   3. Tachycardia      ED Assessment/Plan  Patient is febrile, tachycardic here.  In the differential is DVT, gout, septic arthritis, ruptured Baker's cyst. Patient was given 650 milligrams of Tylenol for fever and pain with improvement in his symptoms.  Transferring to the emergency department for comprehensive work-up. He is stable to go by private vehicle.  Discussed differential, rationale for transfer to the emergency department, patient agrees to go.  Meds ordered this encounter  Medications  . acetaminophen (TYLENOL) tablet 650 mg    *This clinic note was created using [employer]. Therefore, there may be occasional mistakes despite careful proofreading.   ?    Melynda Ripple, MD 06/23/20 1422    Melynda Ripple, MD 06/23/20 1424

## 2020-06-23 NOTE — Discharge Instructions (Addendum)
This could be several things: It could be an infection, gout, Baker's cyst, or blood clot in your leg.  I am concerned because your heart rate is fast and you have a fever.  We have given you Tylenol here for the fever and for the pain.  Go immediately to the [facility] emergency department for comprehensive evaluation.  Let them know if your pain changes, gets worse, or for other concerns.

## 2020-06-23 NOTE — ED Notes (Signed)
ULS at bedside 

## 2020-06-23 NOTE — ED Triage Notes (Signed)
Pt reports he has pain in Rt  Popliteal  Area a nd RT knee along with RT calf. Pt denies any injury or fall. Pt reports he can not walk well or stand on his RT leg.

## 2020-06-23 NOTE — ED Notes (Signed)
Provider at bedside for right knee drainage.

## 2020-06-23 NOTE — ED Notes (Signed)
Ortho tech at bedside attempting to teach pt to use crutches, pt unable to bare weight on left leg, unsteady at this time. Refusing to walk. Provider to be notified.

## 2020-06-23 NOTE — Progress Notes (Signed)
Lower extremity venous RT study completed.  Preliminary results relayed to MD.   See CV Proc for preliminary results report.   Darlin Coco, RDMS

## 2020-06-23 NOTE — ED Triage Notes (Addendum)
Patient sent from urgent care. Complaint of right leg pain and swelling. Patient states has been going on for several days. Patient hypotensive in triages, BP check on both arms, both 45W systolic.

## 2020-06-23 NOTE — ED Notes (Signed)
Pt's temp was 101.0 F. Informed Monica-RN

## 2020-06-23 NOTE — ED Notes (Signed)
Ortho tech called, and pt waiting for ride eta 30 min

## 2020-06-24 DIAGNOSIS — M109 Gout, unspecified: Secondary | ICD-10-CM | POA: Diagnosis not present

## 2020-06-24 LAB — C-REACTIVE PROTEIN: CRP: 19.7 mg/dL — ABNORMAL HIGH (ref <1.0)

## 2020-06-24 LAB — SEDIMENTATION RATE: Sed Rate: 60 mm/hr — ABNORMAL HIGH (ref 0–16)

## 2020-06-24 MED ORDER — ACETAMINOPHEN 500 MG PO TABS
1000.0000 mg | ORAL_TABLET | Freq: Once | ORAL | Status: AC
  Administered 2020-06-24: 1000 mg via ORAL
  Filled 2020-06-24: qty 2

## 2020-06-24 MED ORDER — LIDOCAINE 5 % EX PTCH
1.0000 | MEDICATED_PATCH | CUTANEOUS | Status: DC
  Administered 2020-06-24: 1 via TRANSDERMAL
  Filled 2020-06-24: qty 1

## 2020-06-25 LAB — GLUCOSE, BODY FLUID OTHER: Glucose, Body Fluid Other: 65 mg/dL

## 2020-06-27 LAB — BODY FLUID CULTURE W GRAM STAIN: Culture: NO GROWTH

## 2020-07-02 DIAGNOSIS — M25561 Pain in right knee: Secondary | ICD-10-CM | POA: Diagnosis not present

## 2020-07-02 DIAGNOSIS — M79674 Pain in right toe(s): Secondary | ICD-10-CM | POA: Diagnosis not present

## 2020-07-21 DIAGNOSIS — R972 Elevated prostate specific antigen [PSA]: Secondary | ICD-10-CM | POA: Diagnosis not present

## 2020-07-21 DIAGNOSIS — E291 Testicular hypofunction: Secondary | ICD-10-CM | POA: Diagnosis not present

## 2020-07-28 DIAGNOSIS — E291 Testicular hypofunction: Secondary | ICD-10-CM | POA: Diagnosis not present

## 2020-07-28 DIAGNOSIS — N401 Enlarged prostate with lower urinary tract symptoms: Secondary | ICD-10-CM | POA: Diagnosis not present

## 2020-07-28 DIAGNOSIS — N5201 Erectile dysfunction due to arterial insufficiency: Secondary | ICD-10-CM | POA: Diagnosis not present

## 2020-07-28 DIAGNOSIS — R351 Nocturia: Secondary | ICD-10-CM | POA: Diagnosis not present

## 2020-07-31 DIAGNOSIS — H401132 Primary open-angle glaucoma, bilateral, moderate stage: Secondary | ICD-10-CM | POA: Diagnosis not present

## 2020-07-31 DIAGNOSIS — E119 Type 2 diabetes mellitus without complications: Secondary | ICD-10-CM | POA: Diagnosis not present

## 2020-07-31 DIAGNOSIS — H2513 Age-related nuclear cataract, bilateral: Secondary | ICD-10-CM | POA: Diagnosis not present

## 2020-07-31 DIAGNOSIS — H53433 Sector or arcuate defects, bilateral: Secondary | ICD-10-CM | POA: Diagnosis not present

## 2020-08-12 DIAGNOSIS — E1122 Type 2 diabetes mellitus with diabetic chronic kidney disease: Secondary | ICD-10-CM | POA: Diagnosis not present

## 2020-08-12 DIAGNOSIS — E2749 Other adrenocortical insufficiency: Secondary | ICD-10-CM | POA: Diagnosis not present

## 2020-08-12 DIAGNOSIS — N189 Chronic kidney disease, unspecified: Secondary | ICD-10-CM | POA: Diagnosis not present

## 2020-08-12 DIAGNOSIS — I129 Hypertensive chronic kidney disease with stage 1 through stage 4 chronic kidney disease, or unspecified chronic kidney disease: Secondary | ICD-10-CM | POA: Diagnosis not present

## 2020-08-12 DIAGNOSIS — E038 Other specified hypothyroidism: Secondary | ICD-10-CM | POA: Diagnosis not present

## 2020-08-12 DIAGNOSIS — E669 Obesity, unspecified: Secondary | ICD-10-CM | POA: Diagnosis not present

## 2020-08-12 DIAGNOSIS — E230 Hypopituitarism: Secondary | ICD-10-CM | POA: Diagnosis not present

## 2020-08-15 DIAGNOSIS — E1169 Type 2 diabetes mellitus with other specified complication: Secondary | ICD-10-CM | POA: Diagnosis not present

## 2020-08-15 DIAGNOSIS — E669 Obesity, unspecified: Secondary | ICD-10-CM | POA: Diagnosis not present

## 2020-10-13 DIAGNOSIS — D123 Benign neoplasm of transverse colon: Secondary | ICD-10-CM | POA: Diagnosis not present

## 2020-10-13 DIAGNOSIS — D122 Benign neoplasm of ascending colon: Secondary | ICD-10-CM | POA: Diagnosis not present

## 2020-10-13 DIAGNOSIS — Z86010 Personal history of colonic polyps: Secondary | ICD-10-CM | POA: Diagnosis not present

## 2020-10-15 DIAGNOSIS — D123 Benign neoplasm of transverse colon: Secondary | ICD-10-CM | POA: Diagnosis not present

## 2020-10-15 DIAGNOSIS — D122 Benign neoplasm of ascending colon: Secondary | ICD-10-CM | POA: Diagnosis not present

## 2020-12-01 DIAGNOSIS — Z23 Encounter for immunization: Secondary | ICD-10-CM | POA: Diagnosis not present

## 2020-12-11 DIAGNOSIS — H2513 Age-related nuclear cataract, bilateral: Secondary | ICD-10-CM | POA: Diagnosis not present

## 2020-12-11 DIAGNOSIS — H401132 Primary open-angle glaucoma, bilateral, moderate stage: Secondary | ICD-10-CM | POA: Diagnosis not present

## 2020-12-11 DIAGNOSIS — H53433 Sector or arcuate defects, bilateral: Secondary | ICD-10-CM | POA: Diagnosis not present

## 2020-12-11 DIAGNOSIS — E113291 Type 2 diabetes mellitus with mild nonproliferative diabetic retinopathy without macular edema, right eye: Secondary | ICD-10-CM | POA: Diagnosis not present

## 2021-01-15 ENCOUNTER — Ambulatory Visit: Payer: Medicare Other | Admitting: Neurology

## 2021-01-20 DIAGNOSIS — N401 Enlarged prostate with lower urinary tract symptoms: Secondary | ICD-10-CM | POA: Diagnosis not present

## 2021-01-20 DIAGNOSIS — E291 Testicular hypofunction: Secondary | ICD-10-CM | POA: Diagnosis not present

## 2021-01-21 DIAGNOSIS — E119 Type 2 diabetes mellitus without complications: Secondary | ICD-10-CM | POA: Diagnosis not present

## 2021-01-21 DIAGNOSIS — H53433 Sector or arcuate defects, bilateral: Secondary | ICD-10-CM | POA: Diagnosis not present

## 2021-01-21 DIAGNOSIS — H2513 Age-related nuclear cataract, bilateral: Secondary | ICD-10-CM | POA: Diagnosis not present

## 2021-01-21 DIAGNOSIS — H401132 Primary open-angle glaucoma, bilateral, moderate stage: Secondary | ICD-10-CM | POA: Diagnosis not present

## 2021-01-22 ENCOUNTER — Ambulatory Visit: Payer: Medicare Other | Admitting: Neurology

## 2021-01-26 ENCOUNTER — Ambulatory Visit (INDEPENDENT_AMBULATORY_CARE_PROVIDER_SITE_OTHER): Payer: Medicare Other | Admitting: Neurology

## 2021-01-26 ENCOUNTER — Encounter: Payer: Self-pay | Admitting: Neurology

## 2021-01-26 VITALS — BP 134/94 | HR 75 | Ht 72.0 in | Wt 248.4 lb

## 2021-01-26 DIAGNOSIS — G40909 Epilepsy, unspecified, not intractable, without status epilepticus: Secondary | ICD-10-CM

## 2021-01-26 DIAGNOSIS — G4733 Obstructive sleep apnea (adult) (pediatric): Secondary | ICD-10-CM | POA: Diagnosis not present

## 2021-01-26 NOTE — Progress Notes (Signed)
Reason for visit: Seizures, sleep apnea  Trevor Kelley is an 70 y.o. 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  The meningioma was resected, he had a seizure in January 2019.  He has been placed on Keppra, he seems to be doing quite well with this.  He tolerates the medication, it does not cause any irritability or drowsiness.  He is able to operate a motor vehicle.  He does have a history of sleep apnea and is on CPAP but does not have a sleep physician following him.  The patient indicates that his CPAP machine is aging and he may need new durable medical equipment.  Past Medical History:  Diagnosis Date   CKD (chronic kidney disease)    Diabetes mellitus without complication ([place])    History of pituitary adenoma    Hypertension    Hypothyroidism    OSA (obstructive sleep apnea)    Seizure disorder ([place]) 05/31/2017    Past Surgical History:  Procedure Laterality Date   BRAIN SURGERY      Family History  Problem Relation Age of Onset   Hypothyroidism Other    Diabetes Mellitus II Other    Cancer Other    Hypertension Other     Social history:  reports that he has never smoked. He has never used smokeless tobacco. He reports that he does not drink alcohol and does not use drugs.   No Known Allergies  Medications:  Prior to Admission medications   Medication Sig Start Date End Date Taking? Authorizing Provider  amLODipine (NORVASC) 10 MG tablet Take 10 mg by mouth 2 (two) times daily.    Yes [provider]  calcitRIOL (ROCALTROL) 0.25 MCG capsule Take 0.25 mcg by mouth daily.   Yes [provider]  furosemide (LASIX) 80 MG tablet Take 80 mg by mouth daily. 01/09/19  Yes [provider]  hydrocortisone (CORTEF) 10 MG tablet Take 10-15 mg by mouth See admin instructions. Take 15 mg  tablets every morning  and take 10 mg tablet daily 02/25/17  Yes [provider]  latanoprost (XALATAN) 0.005 % ophthalmic solution SMARTSIG:1 Drop(s) In Eye(s) Every Evening 01/07/20  Yes [provider]  levETIRAcetam (KEPPRA) 500 MG tablet Take 1 tablet (500 mg total) by mouth 2 (two) times daily. 01/16/20  Yes Suzzanne Cloud, NP  levothyroxine (SYNTHROID, LEVOTHROID) 88 MCG tablet Take 88 mcg by mouth daily before breakfast.   Yes [provider]  LUMIGAN 0.01 % SOLN Place 1 drop into both eyes at bedtime.  11/08/17  Yes [provider]  predniSONE (DELTASONE) 20 MG tablet Take 2 tablets (40 mg total) by mouth daily. 06/23/20  Yes Noemi Chapel, MD  simvastatin (ZOCOR) 20 MG tablet Take 20 mg by mouth daily at 6 PM.    Yes [provider]  tamsulosin (FLOMAX) 0.4 MG CAPS capsule Take 0.4 mg by mouth daily.    Yes [provider]  timolol (TIMOPTIC) 0.5 % ophthalmic solution Place 1 drop into both eyes daily.  10/26/16  Yes [provider]  colchicine 0.6 MG tablet Take 1 tablet (0.6 mg total) by mouth 2 (two) times daily for 10 days. 06/23/20 07/03/20  Noemi Chapel, MD    ROS:  Out of a complete 14 system review of symptoms, the patient complains only of the following symptoms, and all other reviewed  systems are negative.  History of seizures Sleep apnea  Blood pressure (!) 134/94, pulse 75, height 6' (1.829 m), weight 248 lb 6 oz (112.7 kg), SpO2 98 %.  Physical Exam  General: The patient is alert and cooperative at the time of the examination.  A skull defect is noted in the right frontotemporal area.  Skin: No significant peripheral edema is noted.   Neurologic Exam  Mental status: The patient is alert and oriented x 3 at the time of the examination. The patient has apparent normal recent and remote memory, with an apparently normal attention span and concentration ability.   Cranial nerves: Facial symmetry is present. Speech is normal, no  aphasia or dysarthria is noted. Extraocular movements are full. Visual fields are full.  Motor: The patient has good strength in all 4 extremities.  Sensory examination: Soft touch sensation is symmetric on the face, arms, and legs.  Coordination: The patient has good finger-nose-finger and heel-to-shin bilaterally.  Gait and station: The patient has a normal gait. Tandem gait is normal. Romberg is negative. No drift is seen.  Reflexes: Deep tendon reflexes are symmetric.   Assessment/Plan:  1.  History of meningioma, status postresection  2.  History of seizures, right frontotemporal encephalomalacia  3.  Sleep apnea on CPAP  The patient will be referred to a sleep physician here, Dr. Brett Fairy, she can also follow him for the seizures.  The patient will continue to take the [place].  He will follow-up in 1 year.  Jill Alexanders MD 01/26/2021 11:37 AM  [facility] [address] [place], [place] [address]  Phone [phone] Fax [phone]

## 2021-01-28 DIAGNOSIS — R351 Nocturia: Secondary | ICD-10-CM | POA: Diagnosis not present

## 2021-01-28 DIAGNOSIS — E291 Testicular hypofunction: Secondary | ICD-10-CM | POA: Diagnosis not present

## 2021-01-28 DIAGNOSIS — N401 Enlarged prostate with lower urinary tract symptoms: Secondary | ICD-10-CM | POA: Diagnosis not present

## 2021-01-28 DIAGNOSIS — N5201 Erectile dysfunction due to arterial insufficiency: Secondary | ICD-10-CM | POA: Diagnosis not present

## 2021-02-23 DIAGNOSIS — N390 Urinary tract infection, site not specified: Secondary | ICD-10-CM | POA: Diagnosis not present

## 2021-02-23 DIAGNOSIS — N1831 Chronic kidney disease, stage 3a: Secondary | ICD-10-CM | POA: Diagnosis not present

## 2021-03-02 DIAGNOSIS — D631 Anemia in chronic kidney disease: Secondary | ICD-10-CM | POA: Diagnosis not present

## 2021-03-02 DIAGNOSIS — I129 Hypertensive chronic kidney disease with stage 1 through stage 4 chronic kidney disease, or unspecified chronic kidney disease: Secondary | ICD-10-CM | POA: Diagnosis not present

## 2021-03-02 DIAGNOSIS — N2581 Secondary hyperparathyroidism of renal origin: Secondary | ICD-10-CM | POA: Diagnosis not present

## 2021-03-02 DIAGNOSIS — N1831 Chronic kidney disease, stage 3a: Secondary | ICD-10-CM | POA: Diagnosis not present

## 2021-03-02 DIAGNOSIS — R809 Proteinuria, unspecified: Secondary | ICD-10-CM | POA: Diagnosis not present

## 2021-03-17 DIAGNOSIS — N401 Enlarged prostate with lower urinary tract symptoms: Secondary | ICD-10-CM | POA: Diagnosis not present

## 2021-03-20 DIAGNOSIS — Z23 Encounter for immunization: Secondary | ICD-10-CM | POA: Diagnosis not present

## 2021-03-25 ENCOUNTER — Other Ambulatory Visit: Payer: Self-pay | Admitting: Neurology

## 2021-04-08 ENCOUNTER — Telehealth: Payer: Self-pay | Admitting: Neurology

## 2021-04-08 ENCOUNTER — Institutional Professional Consult (permissible substitution): Payer: Medicare Other | Admitting: Neurology

## 2021-04-08 NOTE — Telephone Encounter (Signed)
Pt had to be resch today as Dr. Brett Fairy was out. He needs refills of Keppra. He wants [facility] [address].  [phone]

## 2021-04-08 NOTE — Telephone Encounter (Signed)
Refill was sent for the patient on 03/25/21 to [facility]

## 2021-05-22 DIAGNOSIS — Z23 Encounter for immunization: Secondary | ICD-10-CM | POA: Diagnosis not present

## 2021-06-23 ENCOUNTER — Ambulatory Visit (INDEPENDENT_AMBULATORY_CARE_PROVIDER_SITE_OTHER): Payer: Medicare Other | Admitting: Neurology

## 2021-06-23 ENCOUNTER — Encounter: Payer: Self-pay | Admitting: Neurology

## 2021-06-23 VITALS — BP 135/80 | HR 70 | Ht 72.0 in | Wt 252.0 lb

## 2021-06-23 DIAGNOSIS — G40909 Epilepsy, unspecified, not intractable, without status epilepticus: Secondary | ICD-10-CM

## 2021-06-23 DIAGNOSIS — G4733 Obstructive sleep apnea (adult) (pediatric): Secondary | ICD-10-CM

## 2021-06-23 DIAGNOSIS — E669 Obesity, unspecified: Secondary | ICD-10-CM

## 2021-06-23 DIAGNOSIS — E893 Postprocedural hypopituitarism: Secondary | ICD-10-CM

## 2021-06-23 NOTE — Patient Instructions (Signed)

## 2021-06-23 NOTE — Progress Notes (Signed)
SLEEP MEDICINE CLINIC    Provider:  Larey Seat, MD  Primary Care Physician:  retired :  Mauricia Area, MD [place] [address]  now Dr. Posey Pronto.     Referring Provider is retired;  Dr. Lenor Coffin, MD  '@[email or web address].          Chief Complaint according to patient   Patient presents with:     New Patient (Initial Visit)      Referral from Dr. Jannifer Franklin for OSA/on CPAP. He followed the patient for seizure disorder/never followed this patient for OSA or CPAP.  The patient had a brain tumor surgery at [facility]- in 2015, and was form there referred t for a sleep test. Dx with OSA-  Set up w/ current machine ( 06-23-2021) by [facility] 02/17/2015.   Does not need supplies. He is requesting rx for travel cpap. Aware may have to pay out- of pocket- typically not covered by insurance. His renal function has been stabile since 2016.        HISTORY OF PRESENT ILLNESS:  Trevor Kelley is a 71 y.o. year old 87 [place] American male patient seen here as a referral on 06/23/2021 from Dr Jannifer Franklin for a transfer of CPAP care.   Trevor Kelley  has a past medical history of CKD (chronic kidney disease), Diabetes mellitus without complication ([place]), History of pituitary adenoma, Hypertension, Hypothyroidism, OSA (obstructive sleep apnea), and Seizure disorder ([place]) (05/31/2017).   Chief concern according to patient :  Referral from Dr. Jannifer Franklin for OSA/on CPAP. He followed the patient for seizure disorder/never followed this patient for OSA or CPAP.  The patient had a pituitary adenoma, brain tumor surgery at [facility]- in 2015, following a reduction in visual fields.   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 Dx with  OSA-  Set up w/ current machine ( 06-23-2021) by [facility] 02/17/2015.   Does not need supplies. He is requesting rx for travel cpap. Aware may have to pay out- of pocket- typically not covered by insurance. His reports his renal function has been stabile since 2016.    Sleep relevant medical history: pituitary surgery for adenoma, pan hypo-pit. Not transnasal .   Family medical /sleep history: no  other family member on CPAP with OSA,.    Social history:  Patient is retired from delivery truck driving and lives in a household with spouse,  the couple has 2 sons, adult. 2 year old twins, no grandchildren. He used to drive irregular hours, early to late.  Pets are not present. Tobacco use-none .  ETOH use - never ,  Caffeine intake in form of Coffee( once a month) Soda( one a week) . Regular exercise in the gym, 3 times a week. .      Sleep habits are as follows: The patient's dinner time is between  5-6 PM. The patient goes to bed at 10 PM and is  promptly asleep, continues to sleep for 6 hours. The preferred sleep position is on the right side, with the support of 1-3 pillows.  Dreams are reportedly frequent/vivid.  6.30  AM is the usual rise time. The patient wakes up spontaneously.  He reports not feeling refreshed or restored in AM, with symptoms  such as dry mouth, and residual fatigue. Naps are taken infrequently, lasting from 20 to 40 minutes and are more refreshing than nocturnal sleep.    The patient's sleep study took place at [facility] in [place] 2016.  The patient was diagnosed with severe obstructive sleep apnea and titrated first to CPAP which she did not tolerate well he was then switched to BiPAP and to a final pressure of 14 over 9 cm water pressure with an AHI of 0 and a total sleep time of 40.5 minutes at that pressure.  He continues to use his current BiPAP at exactly the same setting with a residual AHI of 3.5 which is excellent.  He has equal amounts  of central and obstructive apneas but the residual AHI is low enough to ignore that.  He does have moderate to severe leakage with the current mask.  He is 97% compliant by days and time with an average use of 8 hours 10 minutes.  A machine for travel purposes need to be a BiPAP machine.   Review of Systems: Out of a complete 14 system review, the patient complains of only the following symptoms, and all other reviewed systems are negative.:    Feeling great on BiPAP- set up in 02-17-2015 with BiPAP.14/9 cm water pressure.      How likely are you to doze in the following situations: 0 = not likely, 1 = slight chance, 2 = moderate chance, 3 = high chance   Sitting and Reading? Watching Television? Sitting inactive in a public place (theater or meeting)? As a passenger in a car for an hour without a break? Lying down in the afternoon when circumstances permit? Sitting and talking to someone? Sitting quietly after lunch without alcohol? In a car, while stopped for a few minutes in traffic?   Total = 9 / 24 points   FSS endorsed at 43/ 63 points. Before testosterone gel, now 23/ 63.   Social History   Socioeconomic History   Marital status: Married    Spouse name: Trevor Kelley   Number of children: 2   Years of education: 12   Highest education level: Not on file  Occupational History   Occupation: Retired  Tobacco Use   Smoking status: Never   Smokeless tobacco: Never  Vaping Use   Vaping Use: Never used  Substance and Sexual Activity   Alcohol use: No   Drug use: No   Sexual activity: Never  Other Topics Concern   Not on file  Social History Narrative   Lives with wife   Married, 2 children   Right handed   12 th grade   Caffeine use: 1 per month or less   Social Determinants of Radio broadcast assistant Strain: Not on file  Food Insecurity: Not on file  Transportation Needs: Not on file  Physical Activity: Not on file  Stress: Not on file  Social Connections:  Not on file    Family History  Problem Relation Age of Onset   Hypothyroidism Other    Diabetes Mellitus II Other    Cancer Other    Hypertension Other     Past Medical History:  Diagnosis Date   CKD (chronic kidney disease)    Diabetes mellitus without complication ([place])    History of pituitary adenoma, resection, post surgical panhypopituitarism. Recovery of visual field restriction ost surgery.     Hypertension    Hypothyroidism    OSA (obstructive sleep apnea)  [facility] 2016.     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 Dr. Redmond Pulling, [facility]. 2015.       Current Outpatient Medications on File Prior to Visit  Medication Sig Dispense Refill   amLODipine (NORVASC) 10 MG tablet Take 10 mg by mouth 2 (two) times daily.      calcitRIOL (ROCALTROL) 0.25 MCG capsule Take 0.25 mcg by mouth daily.     furosemide (LASIX) 80 MG tablet Take 80 mg by mouth daily.     hydrocortisone (CORTEF) 10 MG tablet Take 10-15 mg by mouth See admin instructions. Take 15 mg  tablets every morning and take 10 mg tablet daily     latanoprost (XALATAN) 0.005 % ophthalmic solution SMARTSIG:1 Drop(s) In Eye(s) Every Evening     levETIRAcetam (KEPPRA) 500 MG tablet Take 1 tablet by mouth twice daily 180 tablet 3   levothyroxine (SYNTHROID, LEVOTHROID) 88 MCG tablet Take 88 mcg by mouth daily before breakfast.     LUMIGAN 0.01 % SOLN Place 1 drop into both eyes at bedtime.   3   predniSONE (DELTASONE) 20 MG tablet Take 2 tablets (40 mg total) by mouth daily. 10 tablet 0   simvastatin (ZOCOR) 20 MG tablet Take 20 mg by mouth daily at 6 PM.      tamsulosin (FLOMAX) 0.4 MG CAPS capsule Take 0.4 mg by mouth daily.      TESTOSTERONE COMPOUNDING KIT TD Place 1 mL onto the skin every morning.     timolol (TIMOPTIC) 0.5 % ophthalmic solution Place 1 drop into both eyes daily.       colchicine 0.6 MG tablet Take 1 tablet (0.6 mg total) by mouth 2 (two) times daily for 10 days. 20 tablet 0   No current facility-administered medications on file prior to visit.    No Known Allergies  Physical exam:  Today's Vitals   06/23/21 0952  BP: 135/80  Pulse: 70  SpO2: 97%  Weight: 252 lb (114.3 kg)  Height: 6' (1.829 m)   Body mass index is 34.18 kg/m.   Wt Readings from Last 3 Encounters:  06/23/21 252 lb (114.3 kg)  01/26/21 248 lb 6 oz (112.7 kg)  06/23/20 245 lb (111.1 kg)     Ht Readings from Last 3 Encounters:  06/23/21 6' (1.829 m)  01/26/21 6' (1.829 m)  06/23/20 6' (1.829 m)      General: The patient is awake, alert and appears not in acute distress. The patient is well groomed. Head: Normocephalic, indention of the right forehead from brain surgery.  Neck is supple.  Mallampati 2,  neck circumference:17.25 inches . Nasal airflow  patent.  Retrognathia is mildly present.  seen.  Dental status: biological teeth.  Cardiovascular:  Regular rate and cardiac rhythm by pulse,  without distended neck veins. Respiratory: Lungs are clear to auscultation.  Skin:  Without evidence of ankle edema, or rash. Trunk: The patient's posture is erect.   Neurologic exam : The patient is awake and alert, oriented to place and time.   Memory subjective described as intact.  Attention span & concentration ability appears normal.  Speech is fluent,  without  dysarthria, dysphonia or aphasia.  Mood and affect are appropriate.   Cranial nerves: no loss of smell or taste reported  Pupils are equal and briskly reactive to light. Funduscopic exam deferred. .  Extraocular movements in vertical and horizontal planes  were intact and without nystagmus.  No Diplopia. Visual fields by finger perimetry are intact.!!! Hearing was intact to soft voice and finger rubbing.    Facial sensation intact to fine touch.  Facial motor strength is symmetric and tongue and uvula move  midline.  Neck ROM : rotation, tilt and flexion extension were normal for age and shoulder shrug was symmetrical.    Motor exam:  Symmetric bulk, tone and ROM.   Normal tone without cog wheeling, symmetric grip strength .   Sensory:  Fine touch and vibration were tested  and  normal.  Proprioception tested in the upper extremities was normal.   Coordination: Rapid alternating movements in the fingers/hands were of normal speed.  The Finger-to-nose maneuver was intact without evidence of ataxia, dysmetria or tremor.   Gait and station: Patient could rise unassisted from a seated position, walked without assistive device.  Stance is of normal width/ base and the patient turned with 3 steps.  Toe and heel walk were deferred.  Deep tendon reflexes: in the upper and lower extremities are symmetric and intact.  Babinski response was deferred.       After spending a total time of  45  minutes face to face and additional time for physical and neurologic examination, review of laboratory studies,  personal review of imaging studies, reports and results of other testing and review of referral information / records as far as provided in visit, I have established the following assessments:  1) OSA, severe at baseline , treated with BiPAP after central apneas emerged under CPAP.  2) panhypopituitarism post adenoma s resection, post -surgical seizure once at night- 5 years ago.  3) single seizure but abnormal MRI/ EEG due tot brain surgery.    My Plan is to proceed with:  1) HST to confirm apnea is present and new BiPAP machine to be ordered. [facility].   2) patient is not keen on SPLIT night with BiPAP titration.  I will send an order for a new BiPAP machine with current settings and a travel PAP machine with (likely CPAP settings , of 14 cm water with 3 cm EPR )  Trevor Kelley is presenting with OSA on BiPAP.  Rv with me in 3-4 months, than yearly with NP.  Electronically signed  by: Larey Seat, MD 06/23/2021 10:23 AM  [facility] Board certified by The [employer] of Sleep Medicine and Diplomate of the [employer] of Sleep Medicine. Board certified In Neurology through the [place], Fellow of the [employer] of Neurology. Medical Director of [facility].

## 2021-07-01 DIAGNOSIS — H401132 Primary open-angle glaucoma, bilateral, moderate stage: Secondary | ICD-10-CM | POA: Diagnosis not present

## 2021-07-01 DIAGNOSIS — H53433 Sector or arcuate defects, bilateral: Secondary | ICD-10-CM | POA: Diagnosis not present

## 2021-07-01 DIAGNOSIS — H2513 Age-related nuclear cataract, bilateral: Secondary | ICD-10-CM | POA: Diagnosis not present

## 2021-07-27 DIAGNOSIS — R351 Nocturia: Secondary | ICD-10-CM | POA: Diagnosis not present

## 2021-07-27 DIAGNOSIS — E291 Testicular hypofunction: Secondary | ICD-10-CM | POA: Diagnosis not present

## 2021-07-27 DIAGNOSIS — N403 Nodular prostate with lower urinary tract symptoms: Secondary | ICD-10-CM | POA: Diagnosis not present

## 2021-07-27 DIAGNOSIS — R972 Elevated prostate specific antigen [PSA]: Secondary | ICD-10-CM | POA: Diagnosis not present

## 2021-08-03 ENCOUNTER — Ambulatory Visit: Payer: Medicare Other | Admitting: Neurology

## 2021-08-03 DIAGNOSIS — E669 Obesity, unspecified: Secondary | ICD-10-CM

## 2021-08-03 DIAGNOSIS — G40909 Epilepsy, unspecified, not intractable, without status epilepticus: Secondary | ICD-10-CM

## 2021-08-03 DIAGNOSIS — G4733 Obstructive sleep apnea (adult) (pediatric): Secondary | ICD-10-CM

## 2021-08-03 DIAGNOSIS — E893 Postprocedural hypopituitarism: Secondary | ICD-10-CM

## 2021-08-12 ENCOUNTER — Ambulatory Visit (INDEPENDENT_AMBULATORY_CARE_PROVIDER_SITE_OTHER): Payer: Medicare Other | Admitting: Neurology

## 2021-08-12 DIAGNOSIS — G40909 Epilepsy, unspecified, not intractable, without status epilepticus: Secondary | ICD-10-CM

## 2021-08-12 DIAGNOSIS — G4733 Obstructive sleep apnea (adult) (pediatric): Secondary | ICD-10-CM | POA: Diagnosis not present

## 2021-08-12 DIAGNOSIS — E669 Obesity, unspecified: Secondary | ICD-10-CM

## 2021-08-12 DIAGNOSIS — E893 Postprocedural hypopituitarism: Secondary | ICD-10-CM

## 2021-08-17 NOTE — Progress Notes (Signed)
? ? ?  ?  ?[facility] ?  ?HOME SLEEP TEST REPORT ( by Watch PAT)   ?STUDY DATA:  08-17-2021 ?  ?ORDERING CLINICIAN: Larey Seat, MD  ?REFERRING CLINICIAN:  ?  ?CLINICAL INFORMATION/HISTORY: Trevor Kelley has a  medical history of CKD (chronic kidney disease), Diabetes mellitus  ([place]), History of pituitary adenoma, Hypertension, Hypothyroidism, OSA (obstructive sleep apnea), and Seizure disorder ([place]) (05/31/2017). ?  ?Chief concern according to patient : Referral from Dr. Jannifer Franklin for OSA/on CPAP. He followed the patient for seizure disorder/never followed this patient for OSA or CPAP.  ?The patient had a pituitary adenoma, brain tumor surgery at [facility]- in 2015, following a reduction in visual fields.  ?  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  The patient was diagnosed with severe obstructive sleep apnea and titrated first to CPAP which she did not tolerate well he was then switched to BiPAP and to a final pressure of 14 /9 cm water pressure ,with an AHI of 0 and a total sleep time of 40.5 minutes at that pressure.   ? ?He continues to use his current BiPAP at exactly the same setting with a residual AHI of 3.5 which is excellent. ? ?  ?Epworth sleepiness score: 9/24. ?  ?BMI: 34 kg/m? ?  ?Neck Circumference: 18 ?  ?FINDINGS: ?  ?Sleep Summary: ?  ?Total Recording Time (hours, min):     Total recording time for this home sleep test was 8 hours and 38 minutes of which the total sleep time was calculated at 7 hours and 5 minutes with 12.7% REM sleep.                                ?  ?Respiratory Indices: ?  ?Calculated pAHI (per hour): 36.5/h                           ?  ?REM pAHI: 17.9                                              ?  ?NREM pAHI:     39.2                       ?  ?Positional AHI:   The patient spent the majority of the night in supine position with an AHI of 44/h.  About 64 minutes of sleep time were seen in the right lateral position with an AHI of only 0.9/h. ? ?Snoring data show mild snoring with a mean volume of 40 dB present for about 23% of total sleep time.                                              ?  ?Oxygen Saturation Statistics: ?O2 Saturation Range (%):    Between a nadir of 88% and a maximum of 98%  with a mean 02 saturation of 93%                                 ?  ?O2 Saturation (minutes) <89%: 0.2 minutes        ?  ?Pulse Rate Statistics:  ?  ?Pulse Range:  67-106 bpm, mean heart rate was 82 bpm.             ?  ?IMPRESSION:  This HST confirms the presence of severe sleep apnea with an atypical distribution between REM and non-REM sleep.  This finding would be more typical for central sleep apnea. ?There was no significant oxygen desaturation noted. ?  ?RECOMMENDATION: This patient still has severe sleep apnea likely of complex or central origin and should continue with BiPAP treatment which has been successful in the past.  [facility]Med BiPAP settings should be 14 over 9 cm water pressure, heated humidification to be provided, the patient should be refitted for an interface since he had severe air leakage with the current set up. ? ? ?  ?INTERPRETING PHYSICIAN: ? ? Larey Seat, MD  ? ?Medical Director of [facility].  ? ? ? ? ? ? ? ? ? ? ? ? ? ? ? ? ? ? ? ? ?

## 2021-08-20 DIAGNOSIS — G4733 Obstructive sleep apnea (adult) (pediatric): Secondary | ICD-10-CM | POA: Insufficient documentation

## 2021-08-20 DIAGNOSIS — E893 Postprocedural hypopituitarism: Secondary | ICD-10-CM | POA: Insufficient documentation

## 2021-08-20 NOTE — Progress Notes (Signed)
IMPRESSION:  This HST confirms the presence of severe sleep apnea with an atypical distribution between REM and non-REM sleep.  This finding would be more typical for central sleep apnea. ?There was no significant oxygen desaturation noted. ?? ?RECOMMENDATION: This patient still has severe sleep apnea likely of complex or central origin and should continue with BiPAP treatment which has been successful in the past.  ResMed BiPAP settings should be 14 over 9 cm water pressure, heated humidification to be provided, the patient should be refitted for an interface since he had severe air leakage with the current set up. ??

## 2021-08-20 NOTE — Procedures (Signed)
?  ?  ?[facility] ?  ?HOME SLEEP TEST REPORT ( by Watch PAT)   ?STUDY DATA:  08-17-2021 ?  ?ORDERING CLINICIAN: Larey Seat, MD  ?REFERRING CLINICIAN:  ?  ?CLINICAL INFORMATION/HISTORY: Trevor Kelley has a  medical history of CKD (chronic kidney disease), Diabetes mellitus  ([place]), History of pituitary adenoma, Hypertension, Hypothyroidism, OSA (obstructive sleep apnea), and Seizure disorder ([place]) (05/31/2017). ?  ?Chief concern according to patient : Referral from Dr. Jannifer Franklin for OSA/on CPAP. He followed the patient for seizure disorder/never followed this patient for OSA or CPAP.  ?The patient had a pituitary adenoma, brain tumor surgery at [facility]- in 2015, following a reduction in visual fields.  ?  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  The patient was diagnosed with severe obstructive sleep apnea and titrated first to CPAP which she did not tolerate well he was then switched to BiPAP and to a final pressure of 14 /9 cm water pressure ,with an AHI of 0 and a total sleep time of 40.5 minutes at that pressure.   ? ?He continues to use his current BiPAP at exactly the same setting with a residual AHI of 3.5 which is excellent. ? ?  ?Epworth sleepiness score: 9/24. ?  ?BMI: 34 kg/m? ?  ?Neck Circumference: 18 ?  ?FINDINGS: ?  ?Sleep Summary: ?  ?Total Recording Time (hours, min):     Total recording time for this home sleep test was 8 hours and 38 minutes of which the total sleep time was calculated at 7 hours and 5 minutes with 12.7% REM sleep.                                ?  ?Respiratory Indices: ?  ?Calculated pAHI (per hour): 36.5/h                           ?  ?REM pAHI: 17.9                                              ?  ?NREM pAHI:     39.2                       ?  ?Positional AHI:   The patient spent the majority of the night in supine position with an AHI of 44/h.  About 64 minutes of sleep time were seen in the right lateral position with an AHI of only 0.9/h. ? ?Snoring data show mild snoring with a mean volume of 40 dB present for about 23% of total sleep time.                                              ?  ?Oxygen Saturation Statistics: ?O2 Saturation Range (%):    Between a nadir of 88% and a maximum of 98% with a  mean 02 saturation of 93%                                 ?  ?O2 Saturation (minutes) <89%: 0.2 minutes        ?  ?Pulse Rate Statistics:  ?  ?Pulse Range:  67-106 bpm, mean heart rate was 82 bpm.             ?  ?IMPRESSION:  This HST confirms the presence of severe sleep apnea with an atypical distribution between REM and non-REM sleep.  This finding would be more typical for central sleep apnea. ?There was no significant oxygen desaturation noted. ?  ?RECOMMENDATION: This patient still has severe sleep apnea likely of complex or central origin and should continue with BiPAP treatment which has been successful in the past.  ResMed BiPAP settings should be 14 over 9 cm water pressure, heated humidification to be provided, the patient should be refitted for an interface since he had severe air leakage with the current set up. ? ? ?  ?INTERPRETING PHYSICIAN: ? ? Larey Seat, MD  ? ?Medical Director of [facility] [employer].  ? ? ? ? ? ? ? ? ? ? ? ? ? ? ? ? ?

## 2021-08-24 ENCOUNTER — Telehealth: Payer: Self-pay | Admitting: *Deleted

## 2021-08-24 NOTE — Telephone Encounter (Signed)
Called and spoke w/ wife. Husband not available. She will have him call back about results. ?

## 2021-08-24 NOTE — Telephone Encounter (Signed)
-----   Message from Larey Seat, MD sent at 08/20/2021 12:57 PM EDT ----- ?IMPRESSION:  This HST confirms the presence of severe sleep apnea with an atypical distribution between REM and non-REM sleep.  This finding would be more typical for central sleep apnea. ?There was no significant oxygen desaturation noted. ?? ?RECOMMENDATION: This patient still has severe sleep apnea likely of complex or central origin and should continue with BiPAP treatment which has been successful in the past.  ResMed BiPAP settings should be 14 over 9 cm water pressure, heated humidification to be provided, the patient should be refitted for an interface since he had severe air leakage with the current set up. ?? ?

## 2021-08-24 NOTE — Telephone Encounter (Signed)
Took call from phone staff and spoke w/ pt. Relayed results per Dr. Edwena Felty note. He verbalized understanding.  ? ?He already got set up with new BIPAP machine. Tolerating well. I was able to pull report from resmed airview. Confirmed settings correct. Made f/u for 09/28/21 at 7:45am with SS,NP. Advised him to make sure to bring machine/power cord to appt.  ? ? ?

## 2021-09-01 DIAGNOSIS — D631 Anemia in chronic kidney disease: Secondary | ICD-10-CM | POA: Diagnosis not present

## 2021-09-01 DIAGNOSIS — I129 Hypertensive chronic kidney disease with stage 1 through stage 4 chronic kidney disease, or unspecified chronic kidney disease: Secondary | ICD-10-CM | POA: Diagnosis not present

## 2021-09-01 DIAGNOSIS — N189 Chronic kidney disease, unspecified: Secondary | ICD-10-CM | POA: Diagnosis not present

## 2021-09-01 DIAGNOSIS — N2581 Secondary hyperparathyroidism of renal origin: Secondary | ICD-10-CM | POA: Diagnosis not present

## 2021-09-01 DIAGNOSIS — N1831 Chronic kidney disease, stage 3a: Secondary | ICD-10-CM | POA: Diagnosis not present

## 2021-09-24 ENCOUNTER — Emergency Department (HOSPITAL_COMMUNITY): Payer: Medicare Other

## 2021-09-24 ENCOUNTER — Other Ambulatory Visit: Payer: Self-pay

## 2021-09-24 ENCOUNTER — Encounter (HOSPITAL_COMMUNITY): Payer: Self-pay | Admitting: Emergency Medicine

## 2021-09-24 ENCOUNTER — Observation Stay (HOSPITAL_COMMUNITY)
Admission: EM | Admit: 2021-09-24 | Discharge: 2021-09-26 | Disposition: A | Discharge status: Home / Self Care | Payer: Medicare Other | Attending: Internal Medicine | Admitting: Internal Medicine

## 2021-09-24 DIAGNOSIS — E669 Obesity, unspecified: Secondary | ICD-10-CM | POA: Insufficient documentation

## 2021-09-24 DIAGNOSIS — N179 Acute kidney failure, unspecified: Secondary | ICD-10-CM | POA: Diagnosis not present

## 2021-09-24 DIAGNOSIS — N1831 Chronic kidney disease, stage 3a: Secondary | ICD-10-CM | POA: Diagnosis not present

## 2021-09-24 DIAGNOSIS — E1122 Type 2 diabetes mellitus with diabetic chronic kidney disease: Secondary | ICD-10-CM | POA: Insufficient documentation

## 2021-09-24 DIAGNOSIS — M25562 Pain in left knee: Secondary | ICD-10-CM | POA: Diagnosis not present

## 2021-09-24 DIAGNOSIS — E893 Postprocedural hypopituitarism: Secondary | ICD-10-CM | POA: Diagnosis present

## 2021-09-24 DIAGNOSIS — Z86018 Personal history of other benign neoplasm: Secondary | ICD-10-CM | POA: Insufficient documentation

## 2021-09-24 DIAGNOSIS — R9431 Abnormal electrocardiogram [ECG] [EKG]: Secondary | ICD-10-CM | POA: Diagnosis not present

## 2021-09-24 DIAGNOSIS — R600 Localized edema: Secondary | ICD-10-CM | POA: Insufficient documentation

## 2021-09-24 DIAGNOSIS — R531 Weakness: Secondary | ICD-10-CM | POA: Diagnosis not present

## 2021-09-24 DIAGNOSIS — E1169 Type 2 diabetes mellitus with other specified complication: Secondary | ICD-10-CM | POA: Diagnosis present

## 2021-09-24 DIAGNOSIS — K5900 Constipation, unspecified: Secondary | ICD-10-CM | POA: Diagnosis not present

## 2021-09-24 DIAGNOSIS — E119 Type 2 diabetes mellitus without complications: Secondary | ICD-10-CM | POA: Diagnosis present

## 2021-09-24 DIAGNOSIS — Z79899 Other long term (current) drug therapy: Secondary | ICD-10-CM | POA: Diagnosis not present

## 2021-09-24 DIAGNOSIS — E039 Hypothyroidism, unspecified: Secondary | ICD-10-CM | POA: Diagnosis not present

## 2021-09-24 DIAGNOSIS — R609 Edema, unspecified: Secondary | ICD-10-CM | POA: Diagnosis not present

## 2021-09-24 DIAGNOSIS — I129 Hypertensive chronic kidney disease with stage 1 through stage 4 chronic kidney disease, or unspecified chronic kidney disease: Secondary | ICD-10-CM | POA: Insufficient documentation

## 2021-09-24 DIAGNOSIS — E876 Hypokalemia: Secondary | ICD-10-CM | POA: Diagnosis not present

## 2021-09-24 DIAGNOSIS — N400 Enlarged prostate without lower urinary tract symptoms: Secondary | ICD-10-CM | POA: Insufficient documentation

## 2021-09-24 DIAGNOSIS — G4733 Obstructive sleep apnea (adult) (pediatric): Secondary | ICD-10-CM | POA: Diagnosis present

## 2021-09-24 DIAGNOSIS — J9811 Atelectasis: Secondary | ICD-10-CM | POA: Diagnosis not present

## 2021-09-24 DIAGNOSIS — M1030 Gout due to renal impairment, unspecified site: Secondary | ICD-10-CM | POA: Diagnosis not present

## 2021-09-24 DIAGNOSIS — R8271 Bacteriuria: Secondary | ICD-10-CM | POA: Diagnosis not present

## 2021-09-24 DIAGNOSIS — M25561 Pain in right knee: Secondary | ICD-10-CM | POA: Diagnosis present

## 2021-09-24 LAB — BASIC METABOLIC PANEL
Anion gap: 13 (ref 5–15)
BUN: 27 mg/dL — ABNORMAL HIGH (ref 8–23)
CO2: 21 mmol/L — ABNORMAL LOW (ref 22–32)
Calcium: 8.4 mg/dL — ABNORMAL LOW (ref 8.9–10.3)
Chloride: 106 mmol/L (ref 98–111)
Creatinine, Ser: 2.76 mg/dL — ABNORMAL HIGH (ref 0.61–1.24)
GFR, Estimated: 24 mL/min — ABNORMAL LOW (ref >60)
Glucose, Bld: 122 mg/dL — ABNORMAL HIGH (ref 70–99)
Potassium: 3.6 mmol/L (ref 3.5–5.1)
Sodium: 140 mmol/L (ref 135–145)

## 2021-09-24 LAB — COMPREHENSIVE METABOLIC PANEL
ALT: 15 U/L (ref 0–44)
AST: 19 U/L (ref 15–41)
Albumin: 3.1 g/dL — ABNORMAL LOW (ref 3.5–5.0)
Alkaline Phosphatase: 61 U/L (ref 38–126)
Anion gap: 13 (ref 5–15)
BUN: 26 mg/dL — ABNORMAL HIGH (ref 8–23)
CO2: 24 mmol/L (ref 22–32)
Calcium: 8.8 mg/dL — ABNORMAL LOW (ref 8.9–10.3)
Chloride: 101 mmol/L (ref 98–111)
Creatinine, Ser: 3.04 mg/dL — ABNORMAL HIGH (ref 0.61–1.24)
GFR, Estimated: 21 mL/min — ABNORMAL LOW (ref >60)
Glucose, Bld: 103 mg/dL — ABNORMAL HIGH (ref 70–99)
Potassium: 2.7 mmol/L — CL (ref 3.5–5.1)
Sodium: 138 mmol/L (ref 135–145)
Total Bilirubin: 1.8 mg/dL — ABNORMAL HIGH (ref 0.3–1.2)
Total Protein: 7.4 g/dL (ref 6.5–8.1)

## 2021-09-24 LAB — PROTIME-INR
INR: 1.4 — ABNORMAL HIGH (ref 0.8–1.2)
Prothrombin Time: 17.4 seconds — ABNORMAL HIGH (ref 11.4–15.2)

## 2021-09-24 LAB — URIC ACID: Uric Acid, Serum: 12.3 mg/dL — ABNORMAL HIGH (ref 3.7–8.6)

## 2021-09-24 LAB — CBC WITH DIFFERENTIAL/PLATELET
Abs Immature Granulocytes: 0.06 10*3/uL (ref 0.00–0.07)
Basophils Absolute: 0.1 10*3/uL (ref 0.0–0.1)
Basophils Relative: 0 %
Eosinophils Absolute: 0.1 10*3/uL (ref 0.0–0.5)
Eosinophils Relative: 1 %
HCT: 42.8 % (ref 39.0–52.0)
Hemoglobin: 13.4 g/dL (ref 13.0–17.0)
Immature Granulocytes: 1 %
Lymphocytes Relative: 24 %
Lymphs Abs: 2.9 10*3/uL (ref 0.7–4.0)
MCH: 24.2 pg — ABNORMAL LOW (ref 26.0–34.0)
MCHC: 31.3 g/dL (ref 30.0–36.0)
MCV: 77.4 fL — ABNORMAL LOW (ref 80.0–100.0)
Monocytes Absolute: 1.4 10*3/uL — ABNORMAL HIGH (ref 0.1–1.0)
Monocytes Relative: 12 %
Neutro Abs: 7.7 10*3/uL (ref 1.7–7.7)
Neutrophils Relative %: 62 %
Platelets: 192 10*3/uL (ref 150–400)
RBC: 5.53 MIL/uL (ref 4.22–5.81)
RDW: 15.7 % — ABNORMAL HIGH (ref 11.5–15.5)
WBC: 12.2 10*3/uL — ABNORMAL HIGH (ref 4.0–10.5)
nRBC: 0 % (ref 0.0–0.2)

## 2021-09-24 LAB — URINALYSIS, ROUTINE W REFLEX MICROSCOPIC
Bilirubin Urine: NEGATIVE
Glucose, UA: NEGATIVE mg/dL
Ketones, ur: NEGATIVE mg/dL
Nitrite: NEGATIVE
Protein, ur: 100 mg/dL — AB
Specific Gravity, Urine: 1.012 (ref 1.005–1.030)
WBC, UA: >50 WBC/hpf — ABNORMAL HIGH (ref 0–5)
pH: 5 (ref 5.0–8.0)

## 2021-09-24 LAB — LACTIC ACID, PLASMA
Lactic Acid, Venous: 1.3 mmol/L (ref 0.5–1.9)
Lactic Acid, Venous: 1.3 mmol/L (ref 0.5–1.9)

## 2021-09-24 LAB — BRAIN NATRIURETIC PEPTIDE: B Natriuretic Peptide: 59.7 pg/mL (ref 0.0–100.0)

## 2021-09-24 LAB — GLUCOSE, CAPILLARY
Glucose-Capillary: 105 mg/dL — ABNORMAL HIGH (ref 70–99)
Glucose-Capillary: 192 mg/dL — ABNORMAL HIGH (ref 70–99)

## 2021-09-24 LAB — TROPONIN I (HIGH SENSITIVITY): Troponin I (High Sensitivity): 14 ng/L (ref <18)

## 2021-09-24 LAB — MAGNESIUM: Magnesium: 1.9 mg/dL (ref 1.7–2.4)

## 2021-09-24 LAB — APTT: aPTT: 38 seconds — ABNORMAL HIGH (ref 24–36)

## 2021-09-24 MED ORDER — TAMSULOSIN HCL 0.4 MG PO CAPS
0.4000 mg | ORAL_CAPSULE | Freq: Every day | ORAL | Status: DC
  Administered 2021-09-24 – 2021-09-26 (×3): 0.4 mg via ORAL
  Filled 2021-09-24 (×3): qty 1

## 2021-09-24 MED ORDER — HYDROMORPHONE HCL 1 MG/ML IJ SOLN
0.5000 mg | Freq: Once | INTRAMUSCULAR | Status: AC
  Administered 2021-09-24: 0.5 mg via INTRAVENOUS
  Filled 2021-09-24: qty 1

## 2021-09-24 MED ORDER — ACETAMINOPHEN 500 MG PO TABS: 1000.0000 mg | ORAL_TABLET | Freq: Four times a day (QID) | ORAL | Status: DC | PRN

## 2021-09-24 MED ORDER — TIMOLOL MALEATE 0.5 % OP SOLN
1.0000 [drp] | Freq: Every day | OPHTHALMIC | Status: DC
  Administered 2021-09-25 – 2021-09-26 (×2): 1 [drp] via OPHTHALMIC
  Filled 2021-09-24: qty 5

## 2021-09-24 MED ORDER — SIMVASTATIN 20 MG PO TABS
20.0000 mg | ORAL_TABLET | Freq: Every day | ORAL | Status: DC
  Administered 2021-09-24 – 2021-09-25 (×2): 20 mg via ORAL
  Filled 2021-09-24 (×2): qty 1

## 2021-09-24 MED ORDER — LEVOTHYROXINE SODIUM 88 MCG PO TABS
88.0000 ug | ORAL_TABLET | Freq: Every day | ORAL | Status: DC
  Administered 2021-09-25 – 2021-09-26 (×2): 88 ug via ORAL
  Filled 2021-09-24 (×2): qty 1

## 2021-09-24 MED ORDER — VITAMIN D 25 MCG (1000 UNIT) PO TABS
5000.0000 [IU] | ORAL_TABLET | Freq: Every day | ORAL | Status: DC
  Administered 2021-09-24 – 2021-09-26 (×3): 5000 [IU] via ORAL
  Filled 2021-09-24 (×3): qty 5

## 2021-09-24 MED ORDER — HEPARIN SODIUM (PORCINE) 5000 UNIT/ML IJ SOLN
5000.0000 [IU] | Freq: Three times a day (TID) | INTRAMUSCULAR | Status: DC
  Administered 2021-09-24 – 2021-09-26 (×7): 5000 [IU] via SUBCUTANEOUS
  Filled 2021-09-24 (×7): qty 1

## 2021-09-24 MED ORDER — POTASSIUM CHLORIDE CRYS ER 20 MEQ PO TBCR
40.0000 meq | EXTENDED_RELEASE_TABLET | Freq: Once | ORAL | Status: AC
  Administered 2021-09-24: 40 meq via ORAL
  Filled 2021-09-24: qty 2

## 2021-09-24 MED ORDER — INSULIN ASPART 100 UNIT/ML IJ SOLN
0.0000 [IU] | Freq: Every day | INTRAMUSCULAR | Status: DC
  Administered 2021-09-25: 2 [IU] via SUBCUTANEOUS

## 2021-09-24 MED ORDER — INSULIN ASPART 100 UNIT/ML IJ SOLN
0.0000 [IU] | Freq: Three times a day (TID) | INTRAMUSCULAR | Status: DC
  Administered 2021-09-25 (×2): 1 [IU] via SUBCUTANEOUS
  Administered 2021-09-25: 2 [IU] via SUBCUTANEOUS
  Administered 2021-09-26 (×2): 1 [IU] via SUBCUTANEOUS

## 2021-09-24 MED ORDER — AMLODIPINE BESYLATE 10 MG PO TABS
10.0000 mg | ORAL_TABLET | Freq: Every day | ORAL | Status: DC
  Administered 2021-09-24 – 2021-09-25 (×2): 10 mg via ORAL
  Filled 2021-09-24 (×2): qty 1

## 2021-09-24 MED ORDER — POTASSIUM CHLORIDE 10 MEQ/100ML IV SOLN
10.0000 meq | INTRAVENOUS | Status: AC
  Administered 2021-09-24 (×3): 10 meq via INTRAVENOUS
  Filled 2021-09-24 (×3): qty 100

## 2021-09-24 MED ORDER — POTASSIUM CHLORIDE CRYS ER 20 MEQ PO TBCR
40.0000 meq | EXTENDED_RELEASE_TABLET | ORAL | Status: AC
  Administered 2021-09-24 (×2): 40 meq via ORAL
  Filled 2021-09-24 (×2): qty 2

## 2021-09-24 MED ORDER — HYDROCORTISONE 10 MG PO TABS
10.0000 mg | ORAL_TABLET | Freq: Every day | ORAL | Status: DC
Start: 2021-09-24 — End: 2021-09-26
  Administered 2021-09-24 – 2021-09-25 (×2): 10 mg via ORAL
  Filled 2021-09-24 (×3): qty 1

## 2021-09-24 MED ORDER — CALCITRIOL 0.25 MCG PO CAPS
0.2500 ug | ORAL_CAPSULE | Freq: Every day | ORAL | Status: DC
  Administered 2021-09-24 – 2021-09-26 (×3): 0.25 ug via ORAL
  Filled 2021-09-24 (×3): qty 1

## 2021-09-24 MED ORDER — MAGNESIUM OXIDE -MG SUPPLEMENT 400 (240 MG) MG PO TABS
800.0000 mg | ORAL_TABLET | Freq: Once | ORAL | Status: AC
  Administered 2021-09-24: 800 mg via ORAL
  Filled 2021-09-24: qty 2

## 2021-09-24 MED ORDER — HYDROCORTISONE 5 MG PO TABS
15.0000 mg | ORAL_TABLET | Freq: Every morning | ORAL | Status: DC
  Administered 2021-09-24 – 2021-09-26 (×3): 15 mg via ORAL
  Filled 2021-09-24 (×3): qty 1

## 2021-09-24 MED ORDER — TESTOSTERONE COMPOUNDING KIT 20 % TD CREA: TOPICAL_CREAM | TRANSDERMAL | Status: DC

## 2021-09-24 MED ORDER — LATANOPROST 0.005 % OP SOLN
1.0000 [drp] | Freq: Two times a day (BID) | OPHTHALMIC | Status: DC
  Administered 2021-09-24 – 2021-09-26 (×4): 1 [drp] via OPHTHALMIC
  Filled 2021-09-24: qty 2.5

## 2021-09-24 MED ORDER — PREDNISONE 20 MG PO TABS
30.0000 mg | ORAL_TABLET | Freq: Every day | ORAL | Status: AC
  Administered 2021-09-24 – 2021-09-26 (×3): 30 mg via ORAL
  Filled 2021-09-24 (×3): qty 1

## 2021-09-24 MED ORDER — LEVETIRACETAM 500 MG PO TABS
500.0000 mg | ORAL_TABLET | Freq: Two times a day (BID) | ORAL | Status: DC
  Administered 2021-09-24 – 2021-09-26 (×5): 500 mg via ORAL
  Filled 2021-09-24 (×5): qty 1

## 2021-09-24 NOTE — ED Triage Notes (Signed)
BIB [facility] after pt c/o of weakness, lack of appetite, and BIL swelling of legs since Monday    HX: gout, brain tumor (2015), HTN

## 2021-09-24 NOTE — Progress Notes (Signed)
Pt placed on CPAP for bed. RT will cont to monitor as needed.  

## 2021-09-24 NOTE — H&P (Addendum)
Date: 09/24/2021               Patient Name:  Trevor Kelley MRN: [id number]  DOB: March 24, 1951 Age / Sex: 71 y.o., male   PCP: Mauricia Area, MD         Medical Service: Internal Medicine Teaching Service         Attending Physician: Dr. Lottie Mussel, MD    First Contact: Idamae Schuller MD Pager: Teodora Medici [phone]  Second Contact: Linwood Dibbles, MD Pager: PA [phone]       After Hours (After 5p/  First Contact Pager: [phone]  weekends / holidays): Second Contact Pager: [phone]   SUBJECTIVE   Chief Complaint: Bilateral Knee pain   History of Present Illness: Trevor Kelley 71 year old male with panhypopituitary following resection of pituitary adenoma and CKD stage 3a who presents with pain in bilateral knees. Patient noticed some gradual pain coming on in left knee a week ago. His right knee is hurting now. His pain is manageable while resting, but severe when ambulating. He had a prior episode like this and was treated for gout. He was later told he didn't have gout and no longer on medications for gout.   ED Course: Requested to admit for bilateral lower extremity edema and hypokalemia . Diuresis not started in ED because patient was hypokalemic. Given PO and IV repletion in ED.   Review of Systems  Constitutional:  Negative for chills and fever.  HENT:  Negative for congestion and sore throat.   Eyes:  Negative for blurred vision and double vision.  Cardiovascular:  Negative for chest pain and leg swelling.  Gastrointestinal:  Negative for abdominal pain and nausea.  Genitourinary:  Negative for frequency and urgency.  Musculoskeletal:  Positive for joint pain. Negative for falls.  Skin:  Negative for rash.  Neurological:  Negative for focal weakness and headaches.  Psychiatric/Behavioral:  Negative for depression and suicidal ideas.    Meds:  Current Meds  Medication Sig   amLODipine (NORVASC) 10 MG tablet Take 10 mg by mouth 2 (two) times daily.    calcitRIOL  (ROCALTROL) 0.25 MCG capsule Take 0.25 mcg by mouth daily.   furosemide (LASIX) 80 MG tablet Take 80 mg by mouth 2 (two) times daily.   hydrocortisone (CORTEF) 10 MG tablet Take 10-15 mg by mouth See admin instructions. Take 15 mg  tablets every morning and take 10 mg tablet daily   latanoprost (XALATAN) 0.005 % ophthalmic solution Place 1 drop into both eyes 2 (two) times daily.   levETIRAcetam (KEPPRA) 500 MG tablet Take 1 tablet by mouth twice daily (Patient taking differently: Take 500 mg by mouth 2 (two) times daily.)   levothyroxine (SYNTHROID, LEVOTHROID) 88 MCG tablet Take 88 mcg by mouth daily before breakfast.   simvastatin (ZOCOR) 20 MG tablet Take 20 mg by mouth daily at 6 PM.    tamsulosin (FLOMAX) 0.4 MG CAPS capsule Take 0.4 mg by mouth daily.    TESTOSTERONE COMPOUNDING KIT TD Place 1 mL onto the skin every morning.   timolol (TIMOPTIC) 0.5 % ophthalmic solution Place 1 drop into both eyes daily.    VITAMIN D PO Take 1 tablet by mouth daily.    Past Medical History:  Diagnosis Date   CKD (chronic kidney disease)    Diabetes mellitus without complication ([place])    History of pituitary adenoma    Hypertension    Hypothyroidism    OSA (obstructive sleep apnea)    Seizure disorder ([place]) 05/31/2017  Past Surgical History:  Procedure Laterality Date   BRAIN SURGERY      Social:  Lives With: Wife Occupation: Retired Level of Function: Performs own IADLs and ADLs PCP: Patient followed by nephrologist, does not have a primary care physician Substances: Social History   Tobacco Use   Smoking status: Never   Smokeless tobacco: Never  Vaping Use   Vaping Use: Never used  Substance Use Topics   Alcohol use: Yes    Comment: occassional   Drug use: No     Family History:  Family History  Problem Relation Age of Onset   Cancer - Colon Mother    Hypothyroidism Other    Diabetes Mellitus II Other    Cancer Other    Hypertension Other       Allergies: Allergies as of 09/24/2021   (No Known Allergies)      OBJECTIVE:   Physical Exam: Blood pressure 124/85, pulse 98, temperature 98.8 F (37.1 C), temperature source Oral, resp. rate 18, height 6' (1.829 m), weight 106.5 kg, SpO2 100 %.   Physical Exam Constitutional:      General: He is not in acute distress.    Appearance: He is obese.  HENT:     Mouth/Throat:     Mouth: Mucous membranes are moist.  Eyes:     Conjunctiva/sclera: Conjunctivae normal.  Cardiovascular:     Rate and Rhythm: Normal rate and regular rhythm.  Pulmonary:     Effort: Pulmonary effort is normal.     Breath sounds: Normal breath sounds.  Abdominal:     General: Bowel sounds are normal.     Palpations: Abdomen is soft.  Musculoskeletal:        General: Swelling (mild swelling of bilateral knees, not hot or erythamatous, TTP on medial and lateral joint line, most tender in popliteal fossa) present.     Right lower leg: Edema (Trace edema) present.     Left lower leg: Edema (Trace edema) present.  Skin:    Capillary Refill: Capillary refill takes less than 2 seconds.     Findings: No erythema or rash.  Neurological:     Mental Status: He is oriented to person, place, and time. Mental status is at baseline.  Psychiatric:        Mood and Affect: Mood normal.        Behavior: Behavior normal.      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 with pertinent PMH of panhypopituitary following resection of pituitary adenoma and CKD stage 3a who presents with pain in bilateral knees.   #Gout flair due to renal impairment History  consistent with previous gout flare. Presenting with AKI which could have precipitated flair.  -Given kidney disease and flair possibly going on over a week will treat with Prednisone 30 mg.   #AKI on CKD stage 3a -Most recent labs are a year old and suggest CKD stage IIIa. Have requested notes from [facility] to insure this is correct. Patient says CKD is from longstanding HTN.  - Holding  Lasix - Trend BMP  #Hypokalemia -K 2.8 in ED,  In setting of Lasix. Given oral and IV repletion in ED. Mg 1.9 - Repeat BMP this afternoon  - K >4  #HTN - normotensive - Continue home amlodipine  #Asymptomatic Bacteruria Review of UA collected in ED. Neg nitrites positive for leukocytes and many bacteria. Patient asymptomatic.  - Monitor for symptoms.   #BPH - Continue tamsulosin  #T2DM diet controlled - will place on SSI with addition of prednisone and monitor for steroid induced hyperglycemia  #Panhypopituitarism - continue home medications.   Diet: Normal VTE: Heparin IVF: None,None Code: Full  Signed: Drema Pry, MD Internal Medicine Resident PGY-3 Pager: [phone]  09/24/2021, 4:37 PM

## 2021-09-24 NOTE — ED Notes (Signed)
MD Tyrone Nine made aware of critical K+

## 2021-09-24 NOTE — ED Provider Notes (Signed)
[facility] EMERGENCY DEPARTMENT Provider Note   CSN: [id number] Arrival date & time: 09/24/21  2248     History  Chief Complaint  Patient presents with   Weakness    Trevor GRAUBERGER is a 71 y.o. male.  71 yo M with a chief complaints of leg pain and swelling.  This been going on for about a week now.  Got to the point where he feels like he is too weak to get up and move around.  He denies any back pain.  Denies trauma to the legs.  Started on the left lateral leg and then felt like it got progressively worse and moved to the right.  He denies fevers or chills.   Weakness     Home Medications Prior to Admission medications   Medication Sig Start Date End Date Taking? Authorizing Provider  amLODipine (NORVASC) 10 MG tablet Take 10 mg by mouth 2 (two) times daily.    Yes [provider]  calcitRIOL (ROCALTROL) 0.25 MCG capsule Take 0.25 mcg by mouth daily.   Yes [provider]  furosemide (LASIX) 80 MG tablet Take 80 mg by mouth 2 (two) times daily. 01/09/19  Yes [provider]  hydrocortisone (CORTEF) 10 MG tablet Take 10-15 mg by mouth See admin instructions. Take 15 mg  tablets every morning and take 10 mg tablet daily 02/25/17  Yes [provider]  latanoprost (XALATAN) 0.005 % ophthalmic solution Place 1 drop into both eyes 2 (two) times daily. 01/07/20  Yes [provider]  levETIRAcetam (KEPPRA) 500 MG tablet Take 1 tablet by mouth twice daily Patient taking differently: Take 500 mg by mouth 2 (two) times daily. 03/25/21  Yes Suzzanne Cloud, NP  levothyroxine (SYNTHROID, LEVOTHROID) 88 MCG tablet Take 88 mcg by mouth daily before breakfast.   Yes [provider]  simvastatin (ZOCOR) 20 MG tablet Take 20 mg by mouth daily at 6 PM.    Yes [provider]  tamsulosin (FLOMAX) 0.4 MG CAPS capsule Take 0.4 mg by mouth daily.    Yes [provider]  TESTOSTERONE COMPOUNDING KIT TD Place 1  mL onto the skin every morning.   Yes [provider]  timolol (TIMOPTIC) 0.5 % ophthalmic solution Place 1 drop into both eyes daily.  10/26/16  Yes [provider]  VITAMIN D PO Take 1 tablet by mouth daily.   Yes [provider]  colchicine 0.6 MG tablet Take 1 tablet (0.6 mg total) by mouth 2 (two) times daily for 10 days. Patient not taking: Reported on 09/24/2021 06/23/20 07/03/20  Noemi Chapel, MD  predniSONE (DELTASONE) 20 MG tablet Take 2 tablets (40 mg total) by mouth daily. Patient not taking: Reported on 09/24/2021 06/23/20   Noemi Chapel, MD      Allergies    Patient has no known allergies.    Review of Systems   Review of Systems  Neurological:  Positive for weakness.   Physical Exam Updated Vital Signs BP 136/87   Pulse 94   Temp 98.7 F (37.1 C) (Oral)   Resp 17   Ht 6' (1.829 m)   Wt 114.3 kg   SpO2 98%   BMI 34.18 kg/m  Physical Exam Vitals and nursing note reviewed.  Constitutional:      Appearance: He is well-developed.  HENT:     Head: Normocephalic and atraumatic.  Eyes:     Pupils: Pupils are equal, round, and reactive to light.  Neck:  Vascular: No JVD.  Cardiovascular:     Rate and Rhythm: Normal rate and regular rhythm.     Heart sounds: No murmur heard.   No friction rub. No gallop.  Pulmonary:     Effort: No respiratory distress.     Breath sounds: No wheezing.  Abdominal:     General: There is no distension.     Tenderness: There is no abdominal tenderness. There is no guarding or rebound.  Musculoskeletal:        General: Swelling and tenderness present. Normal range of motion.     Cervical back: Normal range of motion and neck supple.     Comments: Lower extremity edema right slightly greater than left tenderness diffusely about both legs.  Pulse motor and sensation intact bilaterally.  No midline L-spine tenderness step-offs or deformities.  Skin:    Coloration: Skin is not pale.     Findings: No rash.   Neurological:     Mental Status: He is alert and oriented to person, place, and time.  Psychiatric:        Behavior: Behavior normal.    ED Results / Procedures / Treatments   Labs (all labs ordered are listed, but only abnormal results are displayed) Labs Reviewed  COMPREHENSIVE METABOLIC PANEL - Abnormal; Notable for the following components:      Result Value   Potassium 2.7 (*)    Glucose, Bld 103 (*)    BUN 26 (*)    Creatinine, Ser 3.04 (*)    Calcium 8.8 (*)    Albumin 3.1 (*)    Total Bilirubin 1.8 (*)    GFR, Estimated 21 (*)    All other components within normal limits  CBC WITH DIFFERENTIAL/PLATELET - Abnormal; Notable for the following components:   WBC 12.2 (*)    MCV 77.4 (*)    MCH 24.2 (*)    RDW 15.7 (*)    Monocytes Absolute 1.4 (*)    All other components within normal limits  PROTIME-INR - Abnormal; Notable for the following components:   Prothrombin Time 17.4 (*)    INR 1.4 (*)    All other components within normal limits  APTT - Abnormal; Notable for the following components:   aPTT 38 (*)    All other components within normal limits  CULTURE, BLOOD (ROUTINE X 2)  CULTURE, BLOOD (ROUTINE X 2)  URINE CULTURE  LACTIC ACID, PLASMA  LACTIC ACID, PLASMA  BRAIN NATRIURETIC PEPTIDE  MAGNESIUM  URINALYSIS, ROUTINE W REFLEX MICROSCOPIC  BASIC METABOLIC PANEL  TROPONIN I (HIGH SENSITIVITY)    EKG EKG Interpretation  Date/Time:  Thursday Sep 24 2021 07:26:48 EDT Ventricular Rate:  96 PR Interval:  190 QRS Duration: 90 QT Interval:  417 QTC Calculation: 525 R Axis:   -12 Text Interpretation: Sinus rhythm Borderline T abnormalities, anterior leads Prolonged QT interval No significant change since last tracing Confirmed by Deno Etienne [phone]) on 09/24/2021 7:38:02 AM  Radiology DG Chest Port 1 View  Result Date: 09/24/2021 CLINICAL DATA:  72 year old male with possible sepsis. EXAM: PORTABLE CHEST 1 VIEW COMPARISON:  Portable chest 02/23/2018.  FINDINGS: Portable AP semi upright view at 0741 hours. Chronically tortuous thoracic aorta, may be progressed versus mildly exacerbated by lower lung volumes today. Cardiac size appears to remain within normal limits. Other mediastinal contours are within normal limits. Visualized tracheal air column is within normal limits. Mild linear, platelike atelectasis in the left lower lung. Elsewhere Allowing for portable technique the lungs are clear.  No pneumothorax or pleural effusion. No acute osseous abnormality identified. Negative visible bowel gas. IMPRESSION: 1. Lower lung volumes with mild left lung atelectasis. 2. 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 Electronically Signed   By: Genevie Ann M.D.   On: 09/24/2021 07:47    Procedures Procedures    Medications Ordered in ED Medications  potassium chloride 10 mEq in 100 mL IVPB (0 mEq Intravenous Stopped 09/24/21 1042)  amLODipine (NORVASC) tablet 10 mg (has no administration in time range)  calcitRIOL (ROCALTROL) capsule 0.25 mcg (has no administration in time range)  hydrocortisone (CORTEF) tablet 15 mg (has no administration in time range)  hydrocortisone (CORTEF) tablet 10 mg (has no administration in time range)  levETIRAcetam (KEPPRA) tablet 500 mg (has no administration in time range)  levothyroxine (SYNTHROID) tablet 88 mcg (has no administration in time range)  simvastatin (ZOCOR) tablet 20 mg (has no administration in time range)  tamsulosin (FLOMAX) capsule 0.4 mg (has no administration in time range)  Testosterone Compounding Kit 20 % CREA ( Transdermal Not Given 09/24/21 1138)  latanoprost (XALATAN) 0.005 % ophthalmic solution 1 drop (1 drop Both Eyes Not Given 09/24/21 1139)  timolol (TIMOPTIC) 0.5 % ophthalmic solution 1 drop (1 drop Both Eyes Not Given 09/24/21 1138)  cholecalciferol (VITAMIN D3) tablet 5,000 Units (has no administration in time range)  heparin injection 5,000 Units (has no  administration in time range)  HYDROmorphone (DILAUDID) injection 0.5 mg (has no administration in time range)  acetaminophen (TYLENOL) tablet 1,000 mg (has no administration in time range)  potassium chloride SA (KLOR-CON M) CR tablet 40 mEq (40 mEq Oral Given 09/24/21 1040)  magnesium oxide (MAG-OX) tablet 800 mg (800 mg Oral Given 09/24/21 0940)    ED Course/ Medical Decision Making/ A&P                           Medical Decision Making Amount and/or Complexity of Data Reviewed Labs: ordered. Radiology: ordered. ECG/medicine tests: ordered.  Risk OTC drugs. Prescription drug management. Decision regarding hospitalization.   71 yo M with a chief complaints of bilateral leg pain and swelling.  This been going on for about a week and progressively worsening.  Got to the point today where he felt like he was too weak to get up and move around.  We will obtain a laboratory evaluation chest x-ray BNP reassess.  Patient without anemia.  No leukocytosis.  Chest x-ray independently interpreted by me without focal infiltrate or pneumothorax.  Patient does have hypokalemia.  2.7.  Has prolonged QT on EKG.  Will replenish here.  BMP is normal.  Renal function mildly worse than baseline.  With the patient with significant leg edema and hyperkalemic we will need to replenish potassium and then consider diuresis.  Will discuss with medicine for admission.  CRITICAL CARE Performed by: Cecilio Asper   Total critical care time: 35 minutes  Critical care time was exclusive of separately billable procedures and treating other patients.  Critical care was necessary to treat or prevent imminent or life-threatening deterioration.  Critical care was time spent personally by me on the following activities: development of treatment plan with patient and/or surrogate as well as nursing, discussions with consultants, evaluation of patient's response to treatment, examination of patient,  obtaining history from patient or surrogate, ordering and performing treatments and interventions, ordering and review of laboratory studies, ordering and review of radiographic studies, pulse oximetry and  re-evaluation of patient's condition.  The patients results and plan were reviewed and discussed.   Any x-rays performed were independently reviewed by myself.   Differential diagnosis were considered with the presenting HPI.  Medications  potassium chloride 10 mEq in 100 mL IVPB (0 mEq Intravenous Stopped 09/24/21 1042)  amLODipine (NORVASC) tablet 10 mg (has no administration in time range)  calcitRIOL (ROCALTROL) capsule 0.25 mcg (has no administration in time range)  hydrocortisone (CORTEF) tablet 15 mg (has no administration in time range)  hydrocortisone (CORTEF) tablet 10 mg (has no administration in time range)  levETIRAcetam (KEPPRA) tablet 500 mg (has no administration in time range)  levothyroxine (SYNTHROID) tablet 88 mcg (has no administration in time range)  simvastatin (ZOCOR) tablet 20 mg (has no administration in time range)  tamsulosin (FLOMAX) capsule 0.4 mg (has no administration in time range)  Testosterone Compounding Kit 20 % CREA ( Transdermal Not Given 09/24/21 1138)  latanoprost (XALATAN) 0.005 % ophthalmic solution 1 drop (1 drop Both Eyes Not Given 09/24/21 1139)  timolol (TIMOPTIC) 0.5 % ophthalmic solution 1 drop (1 drop Both Eyes Not Given 09/24/21 1138)  cholecalciferol (VITAMIN D3) tablet 5,000 Units (has no administration in time range)  heparin injection 5,000 Units (has no administration in time range)  HYDROmorphone (DILAUDID) injection 0.5 mg (has no administration in time range)  acetaminophen (TYLENOL) tablet 1,000 mg (has no administration in time range)  potassium chloride SA (KLOR-CON M) CR tablet 40 mEq (40 mEq Oral Given 09/24/21 1040)  magnesium oxide (MAG-OX) tablet 800 mg (800 mg Oral Given 09/24/21 0940)    Vitals:   09/24/21 0830 09/24/21  0831 09/24/21 0930 09/24/21 1030  BP: 125/80  123/82 136/87  Pulse: 92  91 94  Resp: 13  (!) 24 17  Temp:  98.7 F (37.1 C)    TempSrc:  Oral    SpO2: 98%  96% 98%  Weight:      Height:        Final diagnoses:  Hypokalemia  Lower leg edema    Admission/ observation were discussed with the admitting physician, patient and/or family and they are comfortable with the plan.          Final Clinical Impression(s) / ED Diagnoses Final diagnoses:  Hypokalemia  Lower leg edema    Rx / DC Orders ED Discharge Orders     None         Deno Etienne, DO 09/24/21 1146

## 2021-09-25 ENCOUNTER — Observation Stay (HOSPITAL_COMMUNITY): Payer: Medicare Other

## 2021-09-25 DIAGNOSIS — E669 Obesity, unspecified: Secondary | ICD-10-CM

## 2021-09-25 DIAGNOSIS — M10362 Gout due to renal impairment, left knee: Secondary | ICD-10-CM | POA: Diagnosis not present

## 2021-09-25 DIAGNOSIS — E893 Postprocedural hypopituitarism: Secondary | ICD-10-CM | POA: Diagnosis not present

## 2021-09-25 DIAGNOSIS — K7689 Other specified diseases of liver: Secondary | ICD-10-CM | POA: Diagnosis not present

## 2021-09-25 DIAGNOSIS — N179 Acute kidney failure, unspecified: Secondary | ICD-10-CM

## 2021-09-25 DIAGNOSIS — E1169 Type 2 diabetes mellitus with other specified complication: Secondary | ICD-10-CM | POA: Diagnosis not present

## 2021-09-25 DIAGNOSIS — N281 Cyst of kidney, acquired: Secondary | ICD-10-CM | POA: Diagnosis not present

## 2021-09-25 LAB — BASIC METABOLIC PANEL
Anion gap: 10 (ref 5–15)
Anion gap: 11 (ref 5–15)
BUN: 30 mg/dL — ABNORMAL HIGH (ref 8–23)
BUN: 37 mg/dL — ABNORMAL HIGH (ref 8–23)
CO2: 23 mmol/L (ref 22–32)
CO2: 21 mmol/L — ABNORMAL LOW (ref 22–32)
Calcium: 8.6 mg/dL — ABNORMAL LOW (ref 8.9–10.3)
Calcium: 8.9 mg/dL (ref 8.9–10.3)
Chloride: 105 mmol/L (ref 98–111)
Chloride: 104 mmol/L (ref 98–111)
Creatinine, Ser: 2.94 mg/dL — ABNORMAL HIGH (ref 0.61–1.24)
Creatinine, Ser: 2.64 mg/dL — ABNORMAL HIGH (ref 0.61–1.24)
GFR, Estimated: 22 mL/min — ABNORMAL LOW (ref >60)
GFR, Estimated: 25 mL/min — ABNORMAL LOW (ref >60)
Glucose, Bld: 167 mg/dL — ABNORMAL HIGH (ref 70–99)
Glucose, Bld: 194 mg/dL — ABNORMAL HIGH (ref 70–99)
Potassium: 4.1 mmol/L (ref 3.5–5.1)
Potassium: 4.1 mmol/L (ref 3.5–5.1)
Sodium: 138 mmol/L (ref 135–145)
Sodium: 136 mmol/L (ref 135–145)

## 2021-09-25 LAB — GLUCOSE, CAPILLARY
Glucose-Capillary: 190 mg/dL — ABNORMAL HIGH (ref 70–99)
Glucose-Capillary: 219 mg/dL — ABNORMAL HIGH (ref 70–99)
Glucose-Capillary: 188 mg/dL — ABNORMAL HIGH (ref 70–99)
Glucose-Capillary: 216 mg/dL — ABNORMAL HIGH (ref 70–99)

## 2021-09-25 LAB — HEMOGLOBIN A1C
Hgb A1c MFr Bld: 6.4 % — ABNORMAL HIGH (ref 4.8–5.6)
Mean Plasma Glucose: 136.98 mg/dL

## 2021-09-25 LAB — MAGNESIUM: Magnesium: 2.2 mg/dL (ref 1.7–2.4)

## 2021-09-25 MED ORDER — LACTATED RINGERS IV BOLUS
1000.0000 mL | Freq: Once | INTRAVENOUS | Status: AC
  Administered 2021-09-25: 1000 mL via INTRAVENOUS

## 2021-09-25 MED ORDER — TESTOSTERONE COMPOUNDING KIT 20 % TD CREA: 2.0000 "application " | TOPICAL_CREAM | Freq: Every day | TRANSDERMAL | Status: DC

## 2021-09-25 NOTE — Plan of Care (Signed)
  Problem: Nutrition: Goal: Adequate nutrition will be maintained Outcome: Progressing   Problem: Pain Managment: Goal: General experience of comfort will improve Outcome: Progressing   Problem: Safety: Goal: Ability to remain free from injury will improve Outcome: Progressing   

## 2021-09-25 NOTE — Care Management Obs Status (Signed)
[place] NOTIFICATION   Patient Details  Name: AHREN PETTINGER MRN: [id number] Date of Birth: Apr 24, 1951   Medicare Observation Status Notification Given:  Yes    Marilu Favre, RN 09/25/2021, 2:28 PM

## 2021-09-25 NOTE — TOC Initial Note (Signed)
Transition of Care [facility]) - Initial/Assessment Note    Patient Details  Name: Trevor Kelley MRN: [id number] Date of Birth: [birth year]  Transition of [facility]) CM/SW Contact:    Marilu Favre, RN Phone Number: 09/25/2021, 2:35 PM  Clinical Narrative:                  Spoke to patient and wife at bedside.    Confirmed face sheet information.   Patient has prescription coverage and can get prescriptions filled.   Active with PCP and has transportation to appointments    Expected Discharge Plan: Home/Self Care Barriers to Discharge: Continued Medical Work up   Patient Goals and CMS Choice Patient states their goals for this hospitalization and ongoing recovery are:: to return to home CMS Medicare.gov Compare Post Acute Care list provided to:: Patient    Expected Discharge Plan and Services Expected Discharge Plan: Home/Self Care     Post Acute Care Choice: NA Living arrangements for the past 2 months: Single Family Home                 DME Arranged: N/A         HH Arranged: NA          Prior Living Arrangements/Services Living arrangements for the past 2 months: Single Family Home Lives with:: Spouse Patient language and need for interpreter reviewed:: Yes Do you feel safe going back to the place where you live?: Yes      Need for Family Participation in Patient Care: Yes (Comment) Care giver support system in place?: Yes (comment)   Criminal Activity/Legal Involvement Pertinent to Current Situation/Hospitalization: No - Comment as needed  Activities of Daily Living      Permission Sought/Granted   Permission granted to share information with : Yes, Verbal Permission Granted  Share Information with NAME: wife Neoma Laming           Emotional Assessment Appearance:: Appears stated age Attitude/Demeanor/Rapport: Engaged Affect (typically observed): Accepting Orientation: : Oriented to Self, Oriented to Place, Oriented to  Time, Oriented to  Situation Alcohol / Substance Use: Not Applicable Psych Involvement: No (comment)  Admission diagnosis:  Hypokalemia [E87.6] Lower leg edema [R60.0] Bilateral lower extremity edema [R60.0] Patient Active Problem List   Diagnosis Date Noted   Bilateral lower extremity edema 09/24/2021   Gout due to renal impairment 09/24/2021   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[place]) 08/20/2021   OSA treated with BiPAP 08/20/2021   Seizure disorder ([place]) 05/31/2017   AKI (acute kidney injury) ([place]) 01/03/2017   Thrombocytopenia ([place]) 01/01/2017   Hypokalemia 01/01/2017   Sepsis ([place]) 12/31/2016   Cellulitis 12/31/2016   Diarrhea 12/31/2016   Anemia 12/31/2016   OSA (obstructive sleep apnea) 12/03/2014   Pituitary adenoma ([place]) 02/12/2014   Diabetes mellitus type 2 in obese ([place]) 02/05/2014   Obesity (BMI 30-39.9) 02/05/2014   Secondary adrenal insufficiency ([place]) 02/05/2014   Secondary hypothyroidism 02/05/2014   Secondary male hypogonadism 02/05/2014   PCP:  Elmarie Shiley, MD Pharmacy:   [id number] [place] [id number]: [phone]: [phone] [id number] [address] [address] [place]Phone: [phone]: [phone] [place]- [address] [place] [address]Phone: [phone]: [phone]    Social  Determinants of Health (SDOH) Interventions    Readmission Risk Interventions     View : No data to display.

## 2021-09-25 NOTE — Progress Notes (Addendum)
Subjective:  Overnight:NAEON  States doing well, no acute concerns voiced. States knees are doing better this morning from yesterday.   Objective:  Vital signs in last 24 hours: Normotensive, afebrile, satting well on RA.  Vitals:   09/24/21 2011 09/24/21 2300 09/24/21 2315 09/25/21 0212  BP: 118/73  112/77 120/82  Pulse: (!) 102 98 100 93  Resp: _0 Temp: 99.5 F (37.5 C)  98.9 F (37.2 C) 99.3 F (37.4 C)  TempSrc: Oral  Oral Oral  SpO2: 96% 95% 96% 99%  Weight:      Height:       Supplemental O2: Room Air SpO2: 99 % Filed Weights   09/24/21 0810 09/24/21 1330  Weight: 114.3 kg 106.5 kg    Intake/Output Summary (Last 24 hours) at 09/25/2021 8182 Last data filed at 09/24/2021 2207 Gross per 24 hour  Intake 472.65 ml  Output 300 ml  Net 172.65 ml   Net IO Since Admission: 172.65 mL [09/25/21 0608] Physical Exam General: NAD HENT: NCAT Lungs: CTAB, no wheeze, no rhonchi or rales Cardiovascular: NS rate and rhythm. No LE pitting edema.  Abdomen: No TTP MSK: mild swelling of bilateral knees, no TTP, no erythema  Skin: no lesions on exposed skin Neuro: alert and oriented x4 Psych: normal mood and normal affect  Diagnostics BMP shows 138, K 4.1, BUN 30, Creat 2.94  from 2.76, Ca 8.6 eGFR 22.  Uric acid 12.3 A1c 6.4 Bcx pending Ucx pending UA shows leukocytes, many bacteria and hgb and increased RBCs.   Assessment/Plan: Trevor Kelley is a 71 y.o. 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 Presenting with AKI which could have precipitated his flare (see below). On prednisone 30 mg Day 2. Will plan for 7 days total including taper.  -Continue prednisone 30 mg Day 2.  -Continue to encourage hydration. -Outpatient follow up for repeat uric acid and start uric acid lowering medications. -Continue to monitor   #Stage 2 AKI on CKD stage 3a #HTN Appears most likely 2/2 to lasix use and poor oral intake. Will encourage hydration and start IVF if needed. Appears lasix was started for htn. Last echo from 12/2016 showed EF 60 % with mildly dilated LV and presence of moderate LVH. Will continue amlodipine and continue hold lasix.  -Continue Amlodipine 10 mg and continue to hold lasix.  -Encourage oral hydration -Give 1 L LR if oral hydration is inadequate -CTM on repeat BMP   #Hypokalemia -K 2.8 in ED,  In setting of Lasix. Given oral and IV repletion in ED. Mg 2.2. K 4.1 this am.  - Will replete K and Mag as needed.   #T2DM diet controlled - Chronic. A1c 6.4. Glucose 190. Most likely 2/2 steroid use.  -Continue SSI.   #Panhypopituitarism #Hypothyroidism - continue home medications  #Asymptomatic Bacteruria #BPH Review of UA collected in ED. Neg nitrites positive for leukocytes and many bacteria. Patient asymptomatic. Most likely 2/2 to hx of BPH.  -  Continue tamsulosin  Diet: Normal VTE: Heparin Code: Full Prior to Admission Living Arrangement: Home Anticipated Discharge Location: Home Barriers to Discharge: Medical workup  Dispo: Anticipated discharge in approximately 1 day(s).   Idamae Schuller, MD Tillie Rung. [facility] Internal Medicine Residency, PGY-1  Pager: [phone] After 5 pm and on weekends: Please call the on-call pager

## 2021-09-26 DIAGNOSIS — N179 Acute kidney failure, unspecified: Secondary | ICD-10-CM | POA: Diagnosis not present

## 2021-09-26 LAB — BASIC METABOLIC PANEL
Anion gap: 11 (ref 5–15)
BUN: 39 mg/dL — ABNORMAL HIGH (ref 8–23)
CO2: 21 mmol/L — ABNORMAL LOW (ref 22–32)
Calcium: 8.6 mg/dL — ABNORMAL LOW (ref 8.9–10.3)
Chloride: 102 mmol/L (ref 98–111)
Creatinine, Ser: 2.18 mg/dL — ABNORMAL HIGH (ref 0.61–1.24)
GFR, Estimated: 31 mL/min — ABNORMAL LOW (ref >60)
Glucose, Bld: 164 mg/dL — ABNORMAL HIGH (ref 70–99)
Potassium: 4.1 mmol/L (ref 3.5–5.1)
Sodium: 134 mmol/L — ABNORMAL LOW (ref 135–145)

## 2021-09-26 LAB — URINE CULTURE: Culture: >=100000 — AB

## 2021-09-26 LAB — GLUCOSE, CAPILLARY
Glucose-Capillary: 152 mg/dL — ABNORMAL HIGH (ref 70–99)
Glucose-Capillary: 186 mg/dL — ABNORMAL HIGH (ref 70–99)

## 2021-09-26 MED ORDER — PREDNISONE 10 MG PO TABS: ORAL_TABLET | ORAL | 0 refills | Status: AC

## 2021-09-26 NOTE — Evaluation (Signed)
Physical Therapy Evaluation and Discharge Patient Details Name: Trevor Kelley MRN: [id number] DOB: 02/23/1951 Today's Date: 09/26/2021  History of Present Illness  Pt is a 71 y.o. M who presents 09/24/2021 with bilateral knee pain. Found to have stage 2 AKI and elevated uric acid level consistent with gout flare 2/2 to his AKI. Signifiacnt PMH: panhypopituitary following resection of pituitary adenoma and CKD stage 3a.  Clinical Impression  Patient evaluated by Physical Therapy with no further acute PT needs identified. Pt reports improvement in bilateral knee pain; continues with mild achiness located along posterior aspect of L knee. Pt ambulating 150 ft with and without RW modI and negotiated 4 steps with a right railing to simulate home set up. Education provided regarding activity/exercise recommendations and appropriate DM2. All education has been completed and the patient has no further questions. No follow-up Physical Therapy or equipment needs. PT is signing off. Thank you for this referral.      Recommendations for follow up therapy are one component of a multi-disciplinary discharge planning process, led by the attending physician.  Recommendations may be updated based on patient status, additional functional criteria and insurance authorization.  Follow Up Recommendations No PT follow up    Assistance Recommended at Discharge PRN  Patient can return home with the following  Help with stairs or ramp for entrance    Equipment Recommendations None recommended by PT  Recommendations for Other Services       Functional Status Assessment Patient has had a recent decline in their functional status and demonstrates the ability to make significant improvements in function in a reasonable and predictable amount of time.     Precautions / Restrictions Precautions Precautions: None Restrictions Weight Bearing Restrictions: No      Mobility  Bed Mobility                     Transfers Overall transfer level: Modified independent                 General transfer comment: pushing with both hands off of armrests    Ambulation/Gait Ambulation/Gait assistance: Modified independent (Device/Increase time) Gait Distance (Feet): 150 Feet Assistive device: Rolling walker (2 wheels), None Gait Pattern/deviations: Step-through pattern, Decreased stride length       General Gait Details: Slow, but steady pace, with and without RW  Stairs Stairs: Yes Stairs assistance: Supervision Stair Management: One rail Right Number of Stairs: 4 General stair comments: step by step pattern  Wheelchair Mobility    Modified Rankin (Stroke Patients Only)       Balance Overall balance assessment: Mild deficits observed, not formally tested                                           Pertinent Vitals/Pain Pain Assessment Pain Assessment: Faces Faces Pain Scale: Hurts a little bit Pain Location: L knee > R knee Pain Descriptors / Indicators: Discomfort Pain Intervention(s): Monitored during session    Home Living Family/patient expects to be discharged to:: Private residence Living Arrangements: Spouse/significant other Available Help at Discharge: Family Type of Home: House Home Access: Stairs to enter Entrance Stairs-Rails: Psychiatric nurse of Steps: 3   Home Layout: Able to live on main level with bedroom/bathroom Home Equipment: Conservation officer, nature (2 wheels);Cane - single point      Prior Function Prior Level of Function : Independent/Modified  Independent             Mobility Comments: Goes to gym, walks on incline at 2.5 mph       Hand Dominance        Extremity/Trunk Assessment   Upper Extremity Assessment Upper Extremity Assessment: Overall WFL for tasks assessed    Lower Extremity Assessment Lower Extremity Assessment: Overall WFL for tasks assessed    Cervical / Trunk Assessment Cervical  / Trunk Assessment: Normal  Communication   Communication: No difficulties  Cognition Arousal/Alertness: Awake/alert Behavior During Therapy: WFL for tasks assessed/performed Overall Cognitive Status: Within Functional Limits for tasks assessed                                          General Comments      Exercises     Assessment/Plan    PT Assessment Patient does not need any further PT services  PT Problem List Decreased strength;Decreased activity tolerance;Decreased balance;Decreased mobility;Pain       PT Treatment Interventions      PT Goals (Current goals can be found in the Care Plan section)  Acute Rehab PT Goals Patient Stated Goal: return to normal activity PT Goal Formulation: All assessment and education complete, DC therapy    Frequency       Co-evaluation               AM-PAC PT "6 Clicks" Mobility  Outcome Measure Help needed turning from your back to your side while in a flat bed without using bedrails?: None Help needed moving from lying on your back to sitting on the side of a flat bed without using bedrails?: None Help needed moving to and from a bed to a chair (including a wheelchair)?: None Help needed standing up from a chair using your arms (e.g., wheelchair or bedside chair)?: None Help needed to walk in hospital room?: None Help needed climbing 3-5 steps with a railing? : A Little 6 Click Score: 23    End of Session   Activity Tolerance: Patient tolerated treatment well Patient left: in chair;with call bell/phone within reach Nurse Communication: Mobility status PT Visit Diagnosis: Difficulty in walking, not elsewhere classified (R26.2)    Time: 9794-8016 PT Time Calculation (min) (ACUTE ONLY): 17 min   Charges:   PT Evaluation $PT Eval Low Complexity: [place], PT, DPT Acute Rehabilitation Services Pager [phone] Office [phone]   Deno Etienne 09/26/2021, 12:49  PM

## 2021-09-26 NOTE — Discharge Summary (Signed)
Name: Trevor Kelley MRN: [id number] DOB: 02/05/1951 71 y.o. PCP: Elmarie Shiley, MD  Date of Admission: 09/24/2021  7:21 AM Date of Discharge: 09/26/2021 Attending Physician: Dr. 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   calcitRIOL 0.25 MCG capsule Commonly known as: ROCALTROL Take 0.25 mcg by mouth daily.   hydrocortisone 10 MG tablet Commonly known as: CORTEF Take 10-15 mg by mouth See admin instructions. Take 15 mg  tablets every morning and take 10 mg tablet daily   latanoprost 0.005 % ophthalmic solution Commonly known as: XALATAN Place 1 drop into both eyes 2 (two) times daily.   levETIRAcetam 500 MG tablet Commonly known as: KEPPRA Take 1 tablet by mouth twice daily   levothyroxine 88 MCG tablet Commonly known as: SYNTHROID Take 88 mcg by mouth daily before breakfast.   predniSONE 10 MG tablet Commonly known as: DELTASONE Take 2 tablets (20 mg total) by mouth daily with breakfast for 3 days, THEN 1 tablet (10 mg total) daily with breakfast for 3 days. Start taking on: Sep 26, 2021   simvastatin 20 MG tablet Commonly known as: ZOCOR Take 20 mg by mouth daily at 6 PM.   tamsulosin 0.4 MG Caps capsule Commonly known as: FLOMAX Take 0.4 mg by mouth daily.   TESTOSTERONE COMPOUNDING KIT TD Place 1 mL onto the skin every morning.   timolol 0.5 % ophthalmic solution Commonly known as: TIMOPTIC Place 1 drop into both eyes daily.    VITAMIN D PO Take 1 tablet by mouth daily.        Disposition and follow-up:   Mr.Trevor Kelley was discharged from [facility] in Stable condition.  At the hospital follow up visit please address:  1.  Follow-up:  a. Knee pain/Gout: Gout flare likely secondary to AKI. Improved with prednisone. Discharged home on prednisone taper. Ensure knee pain has completely resolved and patient following steroid taper. Consider starting patient on uric acid reducing medication at your discretion.     b. Acute Kidney Injury: Thought to be pre-renal in the setting of poor PO intake while taking a diuretic. Improved after IV fluids and holding his lasix (sCr 3.04-->2.18). Please recheck BMP and assess volume status.   2.  Labs / imaging needed at time of follow-up: BMP  3.  Pending labs/ test needing follow-up: N/A  Follow-up Appointments: Instructed to follow up with PCP  Hospital Course by problem list: Trevor Kelley is a 71 y.o. with pertinent PMH of panhypopituitary following resection of pituitary adenoma and CKD stage 3a who presents with pain in bilateral knees and found to have stage AKI and elevated uric acid level consistent with gout flare.   Gout flare due to renal impairment Patient with remote history of gout presented with progressive bilateral knee pain, worse on the right. Bedside ultrasound showed small fluid collection in right knee that  was too small to tap. Uric acid elevated to 12.3. Presentation consistent with gout flare. Due to patient's chronic kidney disease, he was started on prednisone 30 mg daily. Knee pain gradually improved and almost gone by day of discharge. He worked with physical therapy and they did not recommend any further follow up. He was discharged on a short prednisone taper. He will need outpatient follow up for repeat uric acid and start uric acid lowering medications once kidney function is stable.    AKI on CKD stage  3a Hypertension Presented with creatinine of 3.04. Unclear baseline but AKI likely secondary to lasix use and poor oral intake. Patient was fluid resuscitated and his kidney function subsequently improved. His lasix was held during the hospitalization and discontinued at discharge. Blood pressure remain stable with continuation of his amlodipine. Creatinine improved to 2.18 on day of discharge. Patient encourage to stay hydrated and follow up with PCP in 1 week.    Hypokalemia Patient was found to have a potassium of 2.8 in ED. Likely in the setting of lasix use. Oral and IV potassium was used to replete and his potassium was up to 4.1 on day of discharge.    T2DM, diet controlled Well-controlled with A1c 6.4. Glucose ranged from 105 to 219 while patient was on steroids. Blood sugar of 152 on day of discharge.   Asymptomatic Bacteruria BPH Urinalysis on admission showed negative nitrites, positive for leukocytes and many bacteria. Patient was asymptomatic and had no fever during hospital. Most likely secondary to his BPH. His home tamsulosin was resumed.   Panhypopituitarism Hypothyroidism We continued all his home medications.     Subjective: Patient evaluated at the bedside laying comfortably in bed. States he feels well. He denies any chest pain, SOB, N/V, or abdominal pain. States he has bee eating and drinking okay. Knee pain has improved significantly.   Discharge Vitals:   BP 122/83 (BP Location: Left Arm)   Pulse 71   Temp 97.7 F (36.5 C) (Oral)   Resp 19   Ht 6' (1.829 m)   Wt 106.5 kg   SpO2 98%   BMI 31.84 kg/m  Discharge Exam: General: Pleasant, well-appearing elderly man laying in bed. No acute distress. CV: RRR. No murmurs, rubs, or gallops. No LE edema Pulmonary: Lungs CTAB. Normal effort. No wheezing or rales. Abdominal: Soft, nontender, nondistended. Normal bowel sounds. Extremities: Palpable radial and DP pulses. Normal ROM. Mild warmth to R knee but no  swelling or erythema. No tenderness to palpation of bilateral knee.  Skin: Warm and dry. No obvious rash or lesions.  Neuro: A&Ox3. Moves all extremities. Normal sensation.    Pertinent Labs, Studies, and Procedures:     Latest Ref Rng & Units 09/24/2021    7:40 AM 06/23/2020    6:09 PM 02/25/2018    5:04 AM  CBC  WBC 4.0 - 10.5 K/uL 12.2   9.5   14.1    Hemoglobin 13.0 - 17.0 g/dL 13.4   11.2   8.7    Hematocrit 39.0 - 52.0 % 42.8   34.3   28.7    Platelets 150 - 400 K/uL 192   180   196         Latest Ref Rng & Units 09/26/2021    3:40 AM 09/25/2021    3:29 PM 09/25/2021    2:58 AM  CMP  Glucose 70 - 99 mg/dL 164   194   167    BUN 8 - 23  mg/dL 39   37   30    Creatinine 0.61 - 1.24 mg/dL 2.18   2.64   2.94    Sodium 135 - 145 mmol/L 134   136   138    Potassium 3.5 - 5.1 mmol/L 4.1   4.1   4.1    Chloride 98 - 111 mmol/L 102   104   105    CO2 22 - 32 mmol/L _0 Calcium 8.9 - 10.3 mg/dL 8.6   8.9   8.6      US RENAL  Result Date: 09/25/2021 CLINICAL DATA:  Acute kidney injury EXAM: RENAL / URINARY TRACT ULTRASOUND COMPLETE COMPARISON:  None Available. FINDINGS: Right Kidney: Renal measurements: 12.5 x 5.2 x 4.8 cm = volume: 163 mL. Echogenicity within normal limits. 5.5 x 2.3 x 3.8 cm right renal cyst. No solid mass or hydronephrosis visualized. Left Kidney: Renal measurements: 11 x 4.8 x 4.8 cm = volume: 134 mL. Echogenicity within normal limits. 2.2 x 2.3 x 1.0 cm left renal cyst. No solid mass or hydronephrosis visualized. Bladder: Appears normal for degree of bladder distention. Other: Increased hepatic parenchymal echogenicity. Enlarged prostate gland. IMPRESSION: 1. No obstructive uropathy. 2. Bilateral renal cysts. 3. Increased hepatic echogenicity as can be seen with hepatic steatosis. Electronically Signed   By: Kathreen Devoid M.D.   On: 09/25/2021 09:12   DG Chest Port 1 View  Result Date: 09/24/2021 CLINICAL DATA:  71 year old male with possible sepsis.  EXAM: PORTABLE CHEST 1 VIEW COMPARISON:  Portable chest 02/23/2018. FINDINGS: Portable AP semi upright view at 0741 hours. Chronically tortuous thoracic aorta, may be progressed versus mildly exacerbated by lower lung volumes today. Cardiac size appears to remain within normal limits. Other mediastinal contours are within normal limits. Visualized tracheal air column is within normal limits. Mild linear, platelike atelectasis in the left lower lung. Elsewhere Allowing for portable technique the lungs are clear. No pneumothorax or pleural effusion. No acute osseous abnormality identified. Negative visible bowel gas. IMPRESSION: 1. Lower lung volumes with mild left lung atelectasis. 2. 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 Electronically Signed   By: Genevie Ann M.D.   On: 09/24/2021 07:47     Discharge Instructions: Mr. Bauernfeind,  It was a pleasure taking care of you at [place] were admitted for knee pain and treated for gout flare and acute kidney injury. We are discharging you home now that you are doing better and your kidney numbers are improving. Please follow the following instructions.  1) Take prednisone 20 mg daily for 3 days, then take 10 mg for 3 more days 2) Follow up with Dr. Posey Pronto next week in the clinic. 3) We are holding your lasix until you see Dr. Posey Pronto next week.   Take care,  Dr. Linwood Dibbles, MD, MPH  Signed: Lacinda Axon, MD 09/26/2021, 7:45 AM   Pager: [phone]

## 2021-09-26 NOTE — Discharge Instructions (Signed)
Mr. Olivero,  It was a pleasure taking care of you at [place] were admitted for knee pain and treated for gout flare and acute kidney injury. We are discharging you home now that you are doing better and your kidney numbers are improving. Please follow the following instructions.  1) Take prednisone 20 mg daily for 3 days, then take 10 mg for 3 more days 2) Follow up with Dr. Posey Pronto next week in the clinic. 3) We are holding your lasix until you see Dr. Posey Pronto next week.   Take care,  Dr. Linwood Dibbles, MD, MPH

## 2021-09-26 NOTE — Plan of Care (Signed)
  Problem: Nutrition: Goal: Adequate nutrition will be maintained Outcome: Progressing   Problem: Elimination: Goal: Will not experience complications related to urinary retention Outcome: Progressing   Problem: Pain Managment: Goal: General experience of comfort will improve Outcome: Progressing   Problem: Safety: Goal: Ability to remain free from injury will improve Outcome: Progressing   

## 2021-09-26 NOTE — Progress Notes (Signed)
Nursing dc note  Patient and wife both verbalized understanding of dc instructions. All belongings and dc papers given to wife.

## 2021-09-28 ENCOUNTER — Ambulatory Visit (INDEPENDENT_AMBULATORY_CARE_PROVIDER_SITE_OTHER): Payer: Medicare Other | Admitting: Neurology

## 2021-09-28 VITALS — BP 134/78 | HR 74 | Ht 72.0 in | Wt 246.0 lb

## 2021-09-28 DIAGNOSIS — G40909 Epilepsy, unspecified, not intractable, without status epilepticus: Secondary | ICD-10-CM | POA: Diagnosis not present

## 2021-09-28 DIAGNOSIS — G4733 Obstructive sleep apnea (adult) (pediatric): Secondary | ICD-10-CM

## 2021-09-28 MED ORDER — LEVETIRACETAM 500 MG PO TABS: 500.0000 mg | ORAL_TABLET | Freq: Two times a day (BID) | ORAL | 3 refills | Status: DC

## 2021-09-28 NOTE — Progress Notes (Signed)
Patient: Trevor Kelley Date of Birth: April 16, 1951  Reason for Visit: Follow up for initial bipap History from: Patient Primary Neurologist: Dr. Brett Fairy   ASSESSMENT AND PLAN 71 y.o. year old male   Sleep apnea on BiPAP -Download shows good compliance, encouraged to continue nightly use, greater than 4 hours -Order sent to DME for continued supplies   2.  History of meningioma 2015, status postresection 3.  History of seizures, right frontotemporal encephalomalacia -Single seizure in 2019 -Continue Keppra 500 mg twice a day -Follow-up in 1 year or sooner if needed, will address BiPAP and seizures   HISTORY OF PRESENT ILLNESS: Today 09/28/21 Trevor Kelley is here today for initial bipap sleep consult. HST 08/12/21 showed severe sleep apnea with atypical distribution between REM and non-REM sleep.  More typical for central sleep apnea.  No significant oxygen desaturation.  He got a new BiPAP machine.  He is enjoying it.  Brief interval history, in the past was on CPAP, then switched to BiPAP.  Just discharged from hospitalization for gout flare.  Feeling much better.  Review of BiPAP data attached in note, shows overall good compliance, he did miss a few days when he was suffering from a gout flare.  AHI is 3.1.  He uses full facemask, leak in the 95th percentile was 20.3, looking at the bar graph, most of the leak was associated on the days he cannot get comfortable from pain.  In regards to his seizures, he had meningioma resection in 2015, had a single seizure in 2019, has remained on Keppra since.  He has not had any further events. Denies headache or other neuro symptom. He lives with his wife, drives a car.  ESS was 6.  He is quite pleasant, feels much better with BiPAP, wishes to continue.  HISTORY  Copied Dr. Brett Fairy 06/23/2021: Trevor Kelley is a 71 y.o. year old 56 [place] American male patient seen here as a referral on 06/23/2021 from Dr Jannifer Franklin for a  transfer of CPAP care.   Trevor Kelley  has a past medical history of CKD (chronic kidney disease), Diabetes mellitus without complication ([place]), History of pituitary adenoma, Hypertension, Hypothyroidism, OSA (obstructive sleep apnea), and Seizure disorder ([place]) (05/31/2017).   Chief concern according to patient :  Referral from Dr. Jannifer Franklin for OSA/on CPAP. He followed the patient for seizure disorder/never followed this patient for OSA or CPAP.  The patient had a pituitary adenoma, brain tumor surgery at [facility]- in 2015, following a reduction in visual fields.    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 Dx with OSA-  Set up w/ current machine ( 06-23-2021) by Ace Gins 02/17/2015.   Does not need supplies. He is requesting rx for travel cpap. Aware may have to pay out- of pocket- typically not covered by insurance. His reports his renal function has been stabile since 2016.    Sleep relevant medical history: pituitary surgery for adenoma, pan hypo-pit. Not transnasal .   Family medical /sleep history: no  other family member on CPAP with OSA,.    Social history:  Patient is retired from delivery truck driving and lives in a household with spouse,  the couple has 2 sons, adult. 24 year old twins,  no grandchildren. He used to drive irregular hours, early to late.  Pets are not present. Tobacco use-none .  ETOH use - never ,  Caffeine intake in form of Coffee( once a month) Soda( one a week) . Regular exercise in the gym, 3 times a week. .       Sleep habits are as follows: The patient's dinner time is between  5-6 PM. The patient goes to bed at 10 PM and is  promptly asleep, continues to sleep for 6 hours. The preferred sleep position is on the  right side, with the support of 1-3 pillows.  Dreams are reportedly frequent/vivid.  6.30  AM is the usual rise time. The patient wakes up spontaneously.  He reports not feeling refreshed or restored in AM, with symptoms such as dry mouth, and residual fatigue. Naps are taken infrequently, lasting from 20 to 40 minutes and are more refreshing than nocturnal sleep.      The patient's sleep study took place at [facility] in [place] 2016.  The patient was diagnosed with severe obstructive sleep apnea and titrated first to CPAP which she did not tolerate well he was then switched to BiPAP and to a final pressure of 14 over 9 cm water pressure with an AHI of 0 and a total sleep time of 40.5 minutes at that pressure.  He continues to use his current BiPAP at exactly the same setting with a residual AHI of 3.5 which is excellent.  He has equal amounts of central and obstructive apneas but the residual AHI is low enough to ignore that.  He does have moderate to severe leakage with the current mask.  He is 97% compliant by days and time with an average use of 8 hours 10 minutes.  A machine for travel purposes need to be a BiPAP machine.  REVIEW OF SYSTEMS: Out of a complete 14 system review of symptoms, the patient complains only of the following symptoms, and all other reviewed systems are negative.  See HPI  ALLERGIES: No Known Allergies  HOME MEDICATIONS: Outpatient Medications Prior to Visit  Medication Sig Dispense Refill   amLODipine (NORVASC) 10 MG tablet Take 10 mg by mouth 2 (two) times daily.      calcitRIOL (ROCALTROL) 0.25 MCG capsule Take 0.25 mcg by mouth daily.     hydrocortisone (CORTEF) 10 MG tablet Take 10-15 mg by mouth See admin instructions. Take 15 mg  tablets every morning and take 10 mg tablet daily     latanoprost (XALATAN) 0.005 % ophthalmic solution Place 1 drop into both eyes 2 (two) times daily.     levothyroxine (SYNTHROID, LEVOTHROID) 88 MCG tablet  Take 88 mcg by mouth daily before breakfast.     predniSONE (DELTASONE) 10 MG tablet Take 2 tablets (20 mg total) by mouth daily with breakfast for 3 days, THEN 1 tablet (10 mg total) daily with breakfast for 3 days. 9 tablet 0   simvastatin (ZOCOR) 20 MG tablet Take 20 mg by mouth daily at 6 PM.      tamsulosin (FLOMAX) 0.4 MG CAPS capsule Take 0.4 mg by mouth daily.      TESTOSTERONE COMPOUNDING KIT TD Place 1 mL onto the skin every morning.     timolol (TIMOPTIC) 0.5 % ophthalmic solution Place 1 drop into both eyes daily.      VITAMIN D PO Take 1 tablet by mouth daily.     levETIRAcetam (KEPPRA) 500 MG tablet Take 1  tablet by mouth twice daily (Patient taking differently: Take 500 mg by mouth 2 (two) times daily.) 180 tablet 3   No facility-administered medications prior to visit.    PAST MEDICAL HISTORY: Past Medical History:  Diagnosis Date   CKD (chronic kidney disease)    Diabetes mellitus without complication ([place])    History of pituitary adenoma    Hypertension    Hypothyroidism    OSA (obstructive sleep apnea)    Seizure disorder ([place]) 05/31/2017    PAST SURGICAL HISTORY: Past Surgical History:  Procedure Laterality Date   BRAIN SURGERY      FAMILY HISTORY: Family History  Problem Relation Age of Onset   Cancer - Colon Mother    Hypothyroidism Other    Diabetes Mellitus II Other    Cancer Other    Hypertension Other     SOCIAL HISTORY: Social History   Socioeconomic History   Marital status: Married    Spouse name: Neoma Laming   Number of children: 2   Years of education: 12   Highest education level: Not on file  Occupational History   Occupation: Retired  Tobacco Use   Smoking status: Never   Smokeless tobacco: Never  Vaping Use   Vaping Use: Never used  Substance and Sexual Activity   Alcohol use: Yes    Comment: occassional   Drug use: No   Sexual activity: Never  Other Topics Concern   Not on file  Social History Narrative   Lives with wife    Married, 2 children   Right handed   81 th grade   Caffeine use: 1 per month or less   Social Determinants of Radio broadcast assistant Strain: Not on file  Food Insecurity: Not on file  Transportation Needs: Not on file  Physical Activity: Not on file  Stress: Not on file  Social Connections: Not on file  Intimate Partner Violence: Not on file    PHYSICAL EXAM  Vitals:   09/28/21 0737  BP: 134/78  Pulse: 74  Weight: 246 lb (111.6 kg)  Height: 6' (1.829 m)   Body mass index is 33.36 kg/m.  Generalized: Well developed, in no acute distress  Neurological examination  Mentation: Alert oriented to time, place, history taking. Follows all commands speech and language fluent Cranial nerve II-XII: Pupils were equal round reactive to light. Extraocular movements were full, visual field were full on confrontational test. Facial sensation and strength were normal. Head turning and shoulder shrug  were normal and symmetric. Motor: The motor testing reveals 5 over 5 strength of all 4 extremities. Good symmetric motor tone is noted throughout.  Sensory: Sensory testing is intact to soft touch on all 4 extremities. No evidence of extinction is noted.  Coordination: Cerebellar testing reveals good finger-nose-finger and heel-to-shin bilaterally.  Gait and station: Gait is cautious, but steady and independent Reflexes: Deep tendon reflexes are symmetric and normal bilaterally.   DIAGNOSTIC DATA (LABS, IMAGING, TESTING) - I reviewed patient records, labs, notes, testing and imaging myself where available.  Lab Results  Component Value Date   WBC 12.2 (H) 09/24/2021   HGB 13.4 09/24/2021   HCT 42.8 09/24/2021   MCV 77.4 (L) 09/24/2021   PLT 192 09/24/2021      Component Value Date/Time   NA 134 (L) 09/26/2021 0340   K 4.1 09/26/2021 0340   CL 102 09/26/2021 0340   CO2 21 (L) 09/26/2021 0340   GLUCOSE 164 (H) 09/26/2021 0340   BUN 39 (  H) 09/26/2021 0340   CREATININE 2.18  (H) 09/26/2021 0340   CALCIUM 8.6 (L) 09/26/2021 0340   PROT 7.4 09/24/2021 0740   ALBUMIN 3.1 (L) 09/24/2021 0740   AST 19 09/24/2021 0740   ALT 15 09/24/2021 0740   ALKPHOS 61 09/24/2021 0740   BILITOT 1.8 (H) 09/24/2021 0740   GFRNONAA 31 (L) 09/26/2021 0340   GFRAA 29 (L) 02/26/2018 0350   Lab Results  Component Value Date   CHOL 122 09/03/2013   HDL 30 (L) 09/03/2013   LDLCALC 71 09/03/2013   TRIG 103 09/03/2013   Lab Results  Component Value Date   HGBA1C 6.4 (H) 09/25/2021   Lab Results  Component Value Date   VITAMINB12 302 12/31/2016   No results found for: TSH  Butler Denmark, AGNP-C, DNP 09/28/2021, 8:01 AM [facility] [address] [place], [place] [address] [phone]

## 2021-09-28 NOTE — Patient Instructions (Signed)
Meds ordered this encounter  Medications   levETIRAcetam (KEPPRA) 500 MG tablet    Sig: Take 1 tablet (500 mg total) by mouth 2 (two) times daily.    Dispense:  180 tablet    Refill:  3   Orders Placed This Encounter  Procedures   For home use only DME Bipap   Continue using BIPAP nightly for more than 4 hours

## 2021-09-29 LAB — CULTURE, BLOOD (ROUTINE X 2)
Culture: NO GROWTH
Culture: NO GROWTH
Special Requests: ADEQUATE
Special Requests: ADEQUATE

## 2021-10-01 DIAGNOSIS — I129 Hypertensive chronic kidney disease with stage 1 through stage 4 chronic kidney disease, or unspecified chronic kidney disease: Secondary | ICD-10-CM | POA: Diagnosis not present

## 2021-10-01 DIAGNOSIS — D631 Anemia in chronic kidney disease: Secondary | ICD-10-CM | POA: Diagnosis not present

## 2021-10-01 DIAGNOSIS — N1831 Chronic kidney disease, stage 3a: Secondary | ICD-10-CM | POA: Diagnosis not present

## 2021-10-01 DIAGNOSIS — N2581 Secondary hyperparathyroidism of renal origin: Secondary | ICD-10-CM | POA: Diagnosis not present

## 2021-10-01 DIAGNOSIS — N390 Urinary tract infection, site not specified: Secondary | ICD-10-CM | POA: Diagnosis not present

## 2021-10-14 DIAGNOSIS — N403 Nodular prostate with lower urinary tract symptoms: Secondary | ICD-10-CM | POA: Diagnosis not present

## 2021-10-14 DIAGNOSIS — N411 Chronic prostatitis: Secondary | ICD-10-CM | POA: Diagnosis not present

## 2021-10-14 DIAGNOSIS — R972 Elevated prostate specific antigen [PSA]: Secondary | ICD-10-CM | POA: Diagnosis not present

## 2021-10-15 DIAGNOSIS — N1831 Chronic kidney disease, stage 3a: Secondary | ICD-10-CM | POA: Diagnosis not present

## 2021-10-15 DIAGNOSIS — I129 Hypertensive chronic kidney disease with stage 1 through stage 4 chronic kidney disease, or unspecified chronic kidney disease: Secondary | ICD-10-CM | POA: Diagnosis not present

## 2021-11-27 DIAGNOSIS — E1169 Type 2 diabetes mellitus with other specified complication: Secondary | ICD-10-CM | POA: Diagnosis not present

## 2021-11-27 DIAGNOSIS — N1832 Chronic kidney disease, stage 3b: Secondary | ICD-10-CM | POA: Diagnosis not present

## 2021-11-27 DIAGNOSIS — Z7952 Long term (current) use of systemic steroids: Secondary | ICD-10-CM | POA: Diagnosis not present

## 2021-11-27 DIAGNOSIS — E038 Other specified hypothyroidism: Secondary | ICD-10-CM | POA: Diagnosis not present

## 2021-11-27 DIAGNOSIS — E2749 Other adrenocortical insufficiency: Secondary | ICD-10-CM | POA: Diagnosis not present

## 2021-11-27 DIAGNOSIS — Z79890 Hormone replacement therapy (postmenopausal): Secondary | ICD-10-CM | POA: Diagnosis not present

## 2021-11-27 DIAGNOSIS — I129 Hypertensive chronic kidney disease with stage 1 through stage 4 chronic kidney disease, or unspecified chronic kidney disease: Secondary | ICD-10-CM | POA: Diagnosis not present

## 2021-11-27 DIAGNOSIS — E669 Obesity, unspecified: Secondary | ICD-10-CM | POA: Diagnosis not present

## 2021-11-27 DIAGNOSIS — E1136 Type 2 diabetes mellitus with diabetic cataract: Secondary | ICD-10-CM | POA: Diagnosis not present

## 2021-11-27 DIAGNOSIS — E230 Hypopituitarism: Secondary | ICD-10-CM | POA: Diagnosis not present

## 2021-11-27 DIAGNOSIS — Z6833 Body mass index (BMI) 33.0-33.9, adult: Secondary | ICD-10-CM | POA: Diagnosis not present

## 2021-11-27 DIAGNOSIS — E1122 Type 2 diabetes mellitus with diabetic chronic kidney disease: Secondary | ICD-10-CM | POA: Diagnosis not present

## 2021-12-08 DIAGNOSIS — E230 Hypopituitarism: Secondary | ICD-10-CM | POA: Diagnosis not present

## 2021-12-08 DIAGNOSIS — E2749 Other adrenocortical insufficiency: Secondary | ICD-10-CM | POA: Diagnosis not present

## 2021-12-08 DIAGNOSIS — N1832 Chronic kidney disease, stage 3b: Secondary | ICD-10-CM | POA: Diagnosis not present

## 2021-12-08 DIAGNOSIS — E669 Obesity, unspecified: Secondary | ICD-10-CM | POA: Diagnosis not present

## 2021-12-08 DIAGNOSIS — E038 Other specified hypothyroidism: Secondary | ICD-10-CM | POA: Diagnosis not present

## 2021-12-08 DIAGNOSIS — E1169 Type 2 diabetes mellitus with other specified complication: Secondary | ICD-10-CM | POA: Diagnosis not present

## 2021-12-08 DIAGNOSIS — E221 Hyperprolactinemia: Secondary | ICD-10-CM | POA: Diagnosis not present

## 2021-12-08 DIAGNOSIS — N429 Disorder of prostate, unspecified: Secondary | ICD-10-CM | POA: Diagnosis not present

## 2021-12-08 DIAGNOSIS — E291 Testicular hypofunction: Secondary | ICD-10-CM | POA: Diagnosis not present

## 2022-01-20 DIAGNOSIS — R972 Elevated prostate specific antigen [PSA]: Secondary | ICD-10-CM | POA: Diagnosis not present

## 2022-01-20 DIAGNOSIS — E291 Testicular hypofunction: Secondary | ICD-10-CM | POA: Diagnosis not present

## 2022-01-26 ENCOUNTER — Ambulatory Visit: Payer: Medicare Other | Admitting: Neurology

## 2022-01-28 DIAGNOSIS — E291 Testicular hypofunction: Secondary | ICD-10-CM | POA: Diagnosis not present

## 2022-01-28 DIAGNOSIS — R351 Nocturia: Secondary | ICD-10-CM | POA: Diagnosis not present

## 2022-01-28 DIAGNOSIS — N401 Enlarged prostate with lower urinary tract symptoms: Secondary | ICD-10-CM | POA: Diagnosis not present

## 2022-02-19 DIAGNOSIS — Z23 Encounter for immunization: Secondary | ICD-10-CM | POA: Diagnosis not present

## 2022-02-22 DIAGNOSIS — E119 Type 2 diabetes mellitus without complications: Secondary | ICD-10-CM | POA: Diagnosis not present

## 2022-02-22 DIAGNOSIS — H401132 Primary open-angle glaucoma, bilateral, moderate stage: Secondary | ICD-10-CM | POA: Diagnosis not present

## 2022-02-22 DIAGNOSIS — H2513 Age-related nuclear cataract, bilateral: Secondary | ICD-10-CM | POA: Diagnosis not present

## 2022-02-22 DIAGNOSIS — H53433 Sector or arcuate defects, bilateral: Secondary | ICD-10-CM | POA: Diagnosis not present

## 2022-03-23 DIAGNOSIS — N1831 Chronic kidney disease, stage 3a: Secondary | ICD-10-CM | POA: Diagnosis not present

## 2022-03-31 DIAGNOSIS — N1831 Chronic kidney disease, stage 3a: Secondary | ICD-10-CM | POA: Diagnosis not present

## 2022-03-31 DIAGNOSIS — N2581 Secondary hyperparathyroidism of renal origin: Secondary | ICD-10-CM | POA: Diagnosis not present

## 2022-03-31 DIAGNOSIS — I129 Hypertensive chronic kidney disease with stage 1 through stage 4 chronic kidney disease, or unspecified chronic kidney disease: Secondary | ICD-10-CM | POA: Diagnosis not present

## 2022-03-31 DIAGNOSIS — D631 Anemia in chronic kidney disease: Secondary | ICD-10-CM | POA: Diagnosis not present

## 2022-05-25 ENCOUNTER — Encounter: Payer: Self-pay | Admitting: Neurology

## 2022-05-25 ENCOUNTER — Telehealth: Payer: Self-pay | Admitting: Neurology

## 2022-05-25 NOTE — Telephone Encounter (Signed)
LVM and sent letter in mail informing pt of need to reschedule 09/29/22 appointment - NP out

## 2022-06-01 DIAGNOSIS — E1122 Type 2 diabetes mellitus with diabetic chronic kidney disease: Secondary | ICD-10-CM | POA: Diagnosis not present

## 2022-06-01 DIAGNOSIS — E1165 Type 2 diabetes mellitus with hyperglycemia: Secondary | ICD-10-CM | POA: Diagnosis not present

## 2022-06-01 DIAGNOSIS — Z79890 Hormone replacement therapy (postmenopausal): Secondary | ICD-10-CM | POA: Diagnosis not present

## 2022-06-01 DIAGNOSIS — Z6833 Body mass index (BMI) 33.0-33.9, adult: Secondary | ICD-10-CM | POA: Diagnosis not present

## 2022-06-01 DIAGNOSIS — E2749 Other adrenocortical insufficiency: Secondary | ICD-10-CM | POA: Diagnosis not present

## 2022-06-01 DIAGNOSIS — E1136 Type 2 diabetes mellitus with diabetic cataract: Secondary | ICD-10-CM | POA: Diagnosis not present

## 2022-06-01 DIAGNOSIS — E230 Hypopituitarism: Secondary | ICD-10-CM | POA: Diagnosis not present

## 2022-06-01 DIAGNOSIS — E669 Obesity, unspecified: Secondary | ICD-10-CM | POA: Diagnosis not present

## 2022-06-01 DIAGNOSIS — E1169 Type 2 diabetes mellitus with other specified complication: Secondary | ICD-10-CM | POA: Diagnosis not present

## 2022-06-01 DIAGNOSIS — N1832 Chronic kidney disease, stage 3b: Secondary | ICD-10-CM | POA: Diagnosis not present

## 2022-06-01 DIAGNOSIS — E291 Testicular hypofunction: Secondary | ICD-10-CM | POA: Diagnosis not present

## 2022-06-01 DIAGNOSIS — E038 Other specified hypothyroidism: Secondary | ICD-10-CM | POA: Diagnosis not present

## 2022-06-01 DIAGNOSIS — E221 Hyperprolactinemia: Secondary | ICD-10-CM | POA: Diagnosis not present

## 2022-06-01 DIAGNOSIS — I129 Hypertensive chronic kidney disease with stage 1 through stage 4 chronic kidney disease, or unspecified chronic kidney disease: Secondary | ICD-10-CM | POA: Diagnosis not present

## 2022-06-02 DIAGNOSIS — D320 Benign neoplasm of cerebral meninges: Secondary | ICD-10-CM | POA: Diagnosis not present

## 2022-06-02 DIAGNOSIS — G9389 Other specified disorders of brain: Secondary | ICD-10-CM | POA: Diagnosis not present

## 2022-06-03 DIAGNOSIS — E1165 Type 2 diabetes mellitus with hyperglycemia: Secondary | ICD-10-CM | POA: Diagnosis not present

## 2022-06-04 DIAGNOSIS — E1122 Type 2 diabetes mellitus with diabetic chronic kidney disease: Secondary | ICD-10-CM | POA: Diagnosis not present

## 2022-06-04 DIAGNOSIS — E1169 Type 2 diabetes mellitus with other specified complication: Secondary | ICD-10-CM | POA: Diagnosis not present

## 2022-06-04 DIAGNOSIS — Z794 Long term (current) use of insulin: Secondary | ICD-10-CM | POA: Diagnosis not present

## 2022-06-04 DIAGNOSIS — E1136 Type 2 diabetes mellitus with diabetic cataract: Secondary | ICD-10-CM | POA: Diagnosis not present

## 2022-06-04 DIAGNOSIS — E1165 Type 2 diabetes mellitus with hyperglycemia: Secondary | ICD-10-CM | POA: Diagnosis not present

## 2022-06-04 DIAGNOSIS — N1832 Chronic kidney disease, stage 3b: Secondary | ICD-10-CM | POA: Diagnosis not present

## 2022-06-04 DIAGNOSIS — E669 Obesity, unspecified: Secondary | ICD-10-CM | POA: Diagnosis not present

## 2022-06-04 DIAGNOSIS — Z978 Presence of other specified devices: Secondary | ICD-10-CM | POA: Diagnosis not present

## 2022-06-04 DIAGNOSIS — Z6832 Body mass index (BMI) 32.0-32.9, adult: Secondary | ICD-10-CM | POA: Diagnosis not present

## 2022-06-04 DIAGNOSIS — N179 Acute kidney failure, unspecified: Secondary | ICD-10-CM | POA: Diagnosis not present

## 2022-06-04 DIAGNOSIS — I129 Hypertensive chronic kidney disease with stage 1 through stage 4 chronic kidney disease, or unspecified chronic kidney disease: Secondary | ICD-10-CM | POA: Diagnosis not present

## 2022-06-05 ENCOUNTER — Observation Stay (HOSPITAL_COMMUNITY): Payer: Medicare Other

## 2022-06-05 ENCOUNTER — Observation Stay (HOSPITAL_COMMUNITY)
Admission: EM | Admit: 2022-06-05 | Discharge: 2022-06-06 | Disposition: A | Discharge status: Home / Self Care | Payer: Medicare Other | Attending: Infectious Diseases | Admitting: Infectious Diseases

## 2022-06-05 DIAGNOSIS — Z8546 Personal history of malignant neoplasm of prostate: Secondary | ICD-10-CM | POA: Diagnosis not present

## 2022-06-05 DIAGNOSIS — E861 Hypovolemia: Secondary | ICD-10-CM | POA: Insufficient documentation

## 2022-06-05 DIAGNOSIS — R8281 Pyuria: Secondary | ICD-10-CM | POA: Insufficient documentation

## 2022-06-05 DIAGNOSIS — Z79899 Other long term (current) drug therapy: Secondary | ICD-10-CM | POA: Diagnosis not present

## 2022-06-05 DIAGNOSIS — Z7984 Long term (current) use of oral hypoglycemic drugs: Secondary | ICD-10-CM | POA: Diagnosis not present

## 2022-06-05 DIAGNOSIS — R319 Hematuria, unspecified: Secondary | ICD-10-CM | POA: Insufficient documentation

## 2022-06-05 DIAGNOSIS — E1122 Type 2 diabetes mellitus with diabetic chronic kidney disease: Secondary | ICD-10-CM | POA: Insufficient documentation

## 2022-06-05 DIAGNOSIS — N1832 Chronic kidney disease, stage 3b: Secondary | ICD-10-CM | POA: Diagnosis not present

## 2022-06-05 DIAGNOSIS — E669 Obesity, unspecified: Secondary | ICD-10-CM | POA: Diagnosis present

## 2022-06-05 DIAGNOSIS — E039 Hypothyroidism, unspecified: Secondary | ICD-10-CM | POA: Insufficient documentation

## 2022-06-05 DIAGNOSIS — N179 Acute kidney failure, unspecified: Secondary | ICD-10-CM | POA: Diagnosis not present

## 2022-06-05 DIAGNOSIS — E230 Hypopituitarism: Secondary | ICD-10-CM

## 2022-06-05 DIAGNOSIS — E1169 Type 2 diabetes mellitus with other specified complication: Secondary | ICD-10-CM | POA: Diagnosis not present

## 2022-06-05 DIAGNOSIS — G4733 Obstructive sleep apnea (adult) (pediatric): Secondary | ICD-10-CM | POA: Diagnosis present

## 2022-06-05 DIAGNOSIS — E876 Hypokalemia: Secondary | ICD-10-CM | POA: Diagnosis not present

## 2022-06-05 DIAGNOSIS — N281 Cyst of kidney, acquired: Secondary | ICD-10-CM | POA: Diagnosis not present

## 2022-06-05 DIAGNOSIS — Z6832 Body mass index (BMI) 32.0-32.9, adult: Secondary | ICD-10-CM | POA: Insufficient documentation

## 2022-06-05 DIAGNOSIS — E1165 Type 2 diabetes mellitus with hyperglycemia: Secondary | ICD-10-CM | POA: Diagnosis not present

## 2022-06-05 DIAGNOSIS — E119 Type 2 diabetes mellitus without complications: Secondary | ICD-10-CM | POA: Diagnosis present

## 2022-06-05 LAB — URINALYSIS, ROUTINE W REFLEX MICROSCOPIC
Bacteria, UA: NONE SEEN
Bilirubin Urine: NEGATIVE
Glucose, UA: >=500 mg/dL — AB
Hgb urine dipstick: NEGATIVE
Ketones, ur: NEGATIVE mg/dL
Nitrite: NEGATIVE
Protein, ur: >=300 mg/dL — AB
Specific Gravity, Urine: 1.013 (ref 1.005–1.030)
WBC, UA: >50 WBC/hpf (ref 0–5)
pH: 5 (ref 5.0–8.0)

## 2022-06-05 LAB — CBC
HCT: 39.5 % (ref 39.0–52.0)
Hemoglobin: 13.2 g/dL (ref 13.0–17.0)
MCH: 25.1 pg — ABNORMAL LOW (ref 26.0–34.0)
MCHC: 33.4 g/dL (ref 30.0–36.0)
MCV: 75.2 fL — ABNORMAL LOW (ref 80.0–100.0)
Platelets: 209 10*3/uL (ref 150–400)
RBC: 5.25 MIL/uL (ref 4.22–5.81)
RDW: 14.2 % (ref 11.5–15.5)
WBC: 10 10*3/uL (ref 4.0–10.5)
nRBC: 0 % (ref 0.0–0.2)

## 2022-06-05 LAB — CBG MONITORING, ED
Glucose-Capillary: 435 mg/dL — ABNORMAL HIGH (ref 70–99)
Glucose-Capillary: 397 mg/dL — ABNORMAL HIGH (ref 70–99)
Glucose-Capillary: 372 mg/dL — ABNORMAL HIGH (ref 70–99)
Glucose-Capillary: 308 mg/dL — ABNORMAL HIGH (ref 70–99)

## 2022-06-05 LAB — BASIC METABOLIC PANEL
Anion gap: 14 (ref 5–15)
BUN: 37 mg/dL — ABNORMAL HIGH (ref 8–23)
CO2: 26 mmol/L (ref 22–32)
Calcium: 8.8 mg/dL — ABNORMAL LOW (ref 8.9–10.3)
Chloride: 90 mmol/L — ABNORMAL LOW (ref 98–111)
Creatinine, Ser: 4.3 mg/dL — ABNORMAL HIGH (ref 0.61–1.24)
GFR, Estimated: 14 mL/min — ABNORMAL LOW (ref >60)
Glucose, Bld: 466 mg/dL — ABNORMAL HIGH (ref 70–99)
Potassium: 2.9 mmol/L — ABNORMAL LOW (ref 3.5–5.1)
Sodium: 130 mmol/L — ABNORMAL LOW (ref 135–145)

## 2022-06-05 MED ORDER — LEVETIRACETAM 500 MG PO TABS
500.0000 mg | ORAL_TABLET | Freq: Two times a day (BID) | ORAL | Status: DC
  Administered 2022-06-05 – 2022-06-06 (×2): 500 mg via ORAL
  Filled 2022-06-05 (×2): qty 1

## 2022-06-05 MED ORDER — SODIUM CHLORIDE 0.9 % IV BOLUS
1000.0000 mL | Freq: Once | INTRAVENOUS | Status: AC
  Administered 2022-06-05: 1000 mL via INTRAVENOUS

## 2022-06-05 MED ORDER — INSULIN ASPART 100 UNIT/ML IJ SOLN
0.0000 [IU] | Freq: Every day | INTRAMUSCULAR | Status: DC
  Administered 2022-06-05: 4 [IU] via SUBCUTANEOUS

## 2022-06-05 MED ORDER — POTASSIUM CHLORIDE CRYS ER 20 MEQ PO TBCR
40.0000 meq | EXTENDED_RELEASE_TABLET | Freq: Once | ORAL | Status: AC
  Administered 2022-06-05: 40 meq via ORAL
  Filled 2022-06-05: qty 2

## 2022-06-05 MED ORDER — ACETAMINOPHEN 650 MG RE SUPP: 650.0000 mg | Freq: Four times a day (QID) | RECTAL | Status: DC | PRN

## 2022-06-05 MED ORDER — POTASSIUM CHLORIDE 10 MEQ/100ML IV SOLN
10.0000 meq | Freq: Once | INTRAVENOUS | Status: AC
  Administered 2022-06-05: 10 meq via INTRAVENOUS
  Filled 2022-06-05: qty 100

## 2022-06-05 MED ORDER — ACETAMINOPHEN 325 MG PO TABS: 650.0000 mg | ORAL_TABLET | Freq: Four times a day (QID) | ORAL | Status: DC | PRN

## 2022-06-05 MED ORDER — LEVOTHYROXINE SODIUM 88 MCG PO TABS
88.0000 ug | ORAL_TABLET | Freq: Every day | ORAL | Status: DC
  Administered 2022-06-06: 88 ug via ORAL
  Filled 2022-06-05: qty 1

## 2022-06-05 MED ORDER — HYDROCORTISONE 10 MG PO TABS
10.0000 mg | ORAL_TABLET | Freq: Every evening | ORAL | Status: DC
  Administered 2022-06-05: 10 mg via ORAL
  Filled 2022-06-05 (×2): qty 1

## 2022-06-05 MED ORDER — HEPARIN SODIUM (PORCINE) 5000 UNIT/ML IJ SOLN
5000.0000 [IU] | Freq: Three times a day (TID) | INTRAMUSCULAR | Status: DC
  Administered 2022-06-05 – 2022-06-06 (×2): 5000 [IU] via SUBCUTANEOUS
  Filled 2022-06-05 (×2): qty 1

## 2022-06-05 MED ORDER — HYDROCORTISONE 10 MG PO TABS: 10.0000 mg | ORAL_TABLET | ORAL | Status: DC

## 2022-06-05 MED ORDER — LATANOPROST 0.005 % OP SOLN
1.0000 [drp] | Freq: Two times a day (BID) | OPHTHALMIC | Status: DC
  Administered 2022-06-05 – 2022-06-06 (×2): 1 [drp] via OPHTHALMIC
  Filled 2022-06-05: qty 2.5

## 2022-06-05 MED ORDER — HYDROCORTISONE 5 MG PO TABS
15.0000 mg | ORAL_TABLET | Freq: Every day | ORAL | Status: DC
  Administered 2022-06-06: 15 mg via ORAL
  Filled 2022-06-05: qty 1

## 2022-06-05 MED ORDER — INSULIN GLARGINE-YFGN 100 UNIT/ML ~~LOC~~ SOLN
12.0000 [IU] | Freq: Every day | SUBCUTANEOUS | Status: DC
  Administered 2022-06-05: 12 [IU] via SUBCUTANEOUS
  Filled 2022-06-05 (×2): qty 0.12

## 2022-06-05 MED ORDER — CALCITRIOL 0.25 MCG PO CAPS
0.2500 ug | ORAL_CAPSULE | Freq: Every day | ORAL | Status: DC
  Administered 2022-06-05 – 2022-06-06 (×2): 0.25 ug via ORAL
  Filled 2022-06-05 (×2): qty 1

## 2022-06-05 MED ORDER — MAGNESIUM OXIDE -MG SUPPLEMENT 400 (240 MG) MG PO TABS
800.0000 mg | ORAL_TABLET | Freq: Once | ORAL | Status: AC
  Administered 2022-06-05: 800 mg via ORAL
  Filled 2022-06-05: qty 2

## 2022-06-05 MED ORDER — LACTATED RINGERS IV SOLN: INTRAVENOUS | Status: DC

## 2022-06-05 MED ORDER — TAMSULOSIN HCL 0.4 MG PO CAPS
0.4000 mg | ORAL_CAPSULE | Freq: Every day | ORAL | Status: DC
  Administered 2022-06-05 – 2022-06-06 (×2): 0.4 mg via ORAL
  Filled 2022-06-05 (×2): qty 1

## 2022-06-05 MED ORDER — TIMOLOL MALEATE 0.5 % OP SOLN
1.0000 [drp] | Freq: Every day | OPHTHALMIC | Status: DC
  Administered 2022-06-05 – 2022-06-06 (×2): 1 [drp] via OPHTHALMIC
  Filled 2022-06-05: qty 5

## 2022-06-05 MED ORDER — INSULIN ASPART 100 UNIT/ML IJ SOLN
0.0000 [IU] | Freq: Three times a day (TID) | INTRAMUSCULAR | Status: DC
  Administered 2022-06-05: 15 [IU] via SUBCUTANEOUS
  Administered 2022-06-06: 8 [IU] via SUBCUTANEOUS

## 2022-06-05 MED ORDER — SIMVASTATIN 20 MG PO TABS
20.0000 mg | ORAL_TABLET | Freq: Every day | ORAL | Status: DC
  Administered 2022-06-05: 20 mg via ORAL
  Filled 2022-06-05: qty 1

## 2022-06-05 NOTE — ED Notes (Signed)
Dr. Tyrone Nine aware of current VS

## 2022-06-05 NOTE — ED Notes (Addendum)
RN bladder scanned pt, bladder showed 92 ML of urine both times. MD notified.

## 2022-06-05 NOTE — H&P (Signed)
Date: 06/05/2022         Patient Name:  Trevor Kelley MRN: [id number]  DOB: 1950/10/22 Age / Sex: 72 y.o., male   PCP: Elmarie Shiley, MD         Medical Service: Internal Medicine Teaching Service         Attending Physician: Dr. Deno Etienne, DO    First Contact: Dr. Carin Primrose Pager: [phone]  Second Contact: Dr. Raymondo Band Pager: [phone]       After Hours (After 5p/  First Contact Pager: [phone]  weekends / holidays): Second Contact Pager: [phone]   Chief Concern:  Sleeping more, dehydrated  History of Present Illness:  Trevor Kelley is a 72 yo male with type 2 diabetes, CKD 3b, hypothyroidism, panhypopituitarism, secondary adrenal insufficiency, who presents to ED for workup of hyperglycemia, hypokalemia, and AKI at request of his outpatient endocrinologist.  Saw his endocrinologist, Dr. Grandville Silos on 1/25 and his sugar level was 400 and BP was high and then they told the patient to come to the emergency department. He was feeling well otherwise and his wife also notes that he feels normal and is acting like himself.   Had an MRI on Tuesday and notes that the contrast always messes up his kidneys.   He has noticed a decrease in his urine output in the last day. Denies any dysuria, straining, or weak stream but does have a history of prostate issues. Urine is now dark yellow. He has also felt thirsty and feels a little more dehydrated than usual. Denies n/v. Had 1 episode of diarrhea. Appetite has been changed - he does not feel as hungry as usual. Has not eaten much in the last few days. Denies any fevers, chills, night sweats, cough, shortness of breath, or back/flank pain. Thinks he pulled a muscle in his chest because he had some pain in there the last few days. It was not worse with exertion nor were there any associated symptoms. This has since resolved.   Review of Systems  Constitutional:  Positive for malaise/fatigue. Negative for chills, diaphoresis, fever and weight  loss.  Respiratory:  Negative for cough and shortness of breath.   Gastrointestinal:  Positive for diarrhea (1 self-limited episode recently). Negative for nausea and vomiting.  Genitourinary:  Negative for dysuria, flank pain, frequency, hematuria and urgency.       Decrease urine output  Neurological:  Negative for speech change, seizures and weakness.   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 Retired Administrator.  Goes to the gym 4-5 times per week. Goes to [employer].  Previously smoked marijuana, no cigarettes. Occasionally drinks alcohol. No other substance use.   Physical Exam: Blood pressure 112/80, pulse 71, temperature 98 F (36.7 C), temperature source Oral, resp. rate 14, SpO2 99 %.  Comfortable appearing Heart rate and rhythm are regular, bilateral radial pulses 2+ Breathing is regular and unlabored,  lungs clear to auscultation Abdomen is soft and non-tender Skin is warm and dry Alert and oriented, speech is fluent and normal for patient (per wife at bedside), equal grip strength, symmetric face, tongue midline and palate with symmetric elevation Pleasant and in good humor, mood and affect concordant  EKG:  Sinus rhythm without evidence of ischemia  Labs:  Sodium 130 Potassium 2.9 CO2 26 Glucose 466 BUN 37 Creatinine 4.3 Anion gap 14 MCV 75.2 UA with  glucosuria, proteinuria, microscopic hematuria, pyuria  Images and other studies: Renal ultrasound with normal kidneys and bladder and enlarged prostate.  Assessment & Plan:  Trevor Kelley is a 72 y.o. with history of diabetes and CKD G3b referred to emergency department by endocrinologist for hyperglycemia, hypokalemia, and AKI.  Active Problems:   * No active hospital problems. *  AKI on CKD G3b Significant reduction in GFR on presentation.  Baseline creatinine around 2.  Has been seen recently by outpatient endocrinologist with steadily increasing creatinine over the last 4 days.  This does coincide with the administration of contrast for what this patient reported was an MRI, however I am not aware of an association between gadolinium and AKI.  Other than insulin, this patient has not started any new medications.  UA notable for hematuria, pyuria, and hyaline casts.  Renal ultrasound notable for prostate enlargement but no hydronephrosis. No flank pain or frank hematuria makes stone less likely. Given history of severe hyperglycemia and decreased appetite I suspect that hypovolemia is a significant contributor. - Continue LR infusion at 100 mL/h for 10 hours - A.m. BMP - Hold home Lasix - Follow urine output  Insulin-dependent type 2 diabetes with hyperglycemia Hypokalemia Per chart review, patient's diabetes has been well-controlled for several years.  Uncertain as to what caused the sudden hyperglycemia.  No evidence of DKA or HHS but I do suspect that this patient is significantly volume depleted.  Continuing IV fluids and will start mealtime and nightly correctional dose insulin.  Hypokalemic in setting of decreased appetite and home furosemide. Potassium replaced in the emergency department. - Continue IV fluids - Insulin glargine 12 units nightly - SSI - Hemoglobin A1c pending - Hold home Lasix  Panhypopituitarism Patient is status post resection of suprasellar mass via  craniotomy.  Follows with endocrinology and takes calcitriol, hydrocortisone, levothyroxine every day.  At this point I am not concerned for adrenal crisis.  He may be relatively adrenally insufficient, which could explain his softer blood pressures, but I do not think he is in adrenal crisis at this time.  No need for stress-dose hydrocortisone in this setting.  I will restart his home medications today. - Hydrocortisone 10 mg every evening and 15 mg every morning - Levothyroxine 88 mcg daily - Continue calcitriol 0.25 mcg daily  History of seizures No seizures for several years. - Continue levetiracetam 500 mg twice daily  Prostate enlargement Confirmed on renal ultrasound. History of prostate cancer. - Continue tamsulosin 0.4 mg daily  Obstructive sleep apnea Adherent to home CPAP. - Continue nightly CPAP  Diet: Carb modified IVF: LR 100 cc/hr x 10 hrs VTE: Heparin Code: Full Surrogate: Wife, Neoma Laming  Admit patient to Observation with expected length of stay less than 2 midnights.  Signed: Nani Gasser MD 06/05/2022, 12:33 PM  Pager: [phone] After 5pm on weekdays and 1pm on weekends: [phone]

## 2022-06-05 NOTE — ED Triage Notes (Signed)
Patient states he was told by his PCP to come to ED for evaluation of abnormal lab work, glucose of 641. Patient states he feels tired, denies having any other complaints, and is alert, oriented, and in no apparent distress at this time.

## 2022-06-05 NOTE — ED Provider Notes (Signed)
[facility] Provider Note   CSN: [id number] Arrival date & time: 06/05/22  4098     History  Chief Complaint  Patient presents with   Hyperglycemia    EFSTATHIOS SAWIN is a 72 y.o. male.  72 yo M with a chief complaints of a low potassium level high blood sugar level and worsening renal function.  This was noted at his endocrinologist appointment yesterday.  He was called at home and told to come to the emergency department for evaluation.  He tells me that he feels rather well.  He denies chest pain denies difficulty breathing denies cough congestion or fever.  Feels like he has been eating and drinking normally.  Denies abdominal pain.  He was urinating quite a bit over the past few days and over the past 24 hours or so has had only about 1 urinary event.  He denies any obvious difficulty with urinating.   Hyperglycemia      Home Medications Prior to Admission medications   Medication Sig Start Date End Date Taking? Authorizing Provider  amLODipine (NORVASC) 10 MG tablet Take 10 mg by mouth 2 (two) times daily.     [provider]  calcitRIOL (ROCALTROL) 0.25 MCG capsule Take 0.25 mcg by mouth daily.    [provider]  hydrocortisone (CORTEF) 10 MG tablet Take 10-15 mg by mouth See admin instructions. Take 15 mg  tablets every morning and take 10 mg tablet daily 02/25/17   [provider]  latanoprost (XALATAN) 0.005 % ophthalmic solution Place 1 drop into both eyes 2 (two) times daily. 01/07/20   [provider]  levETIRAcetam (KEPPRA) 500 MG tablet Take 1 tablet (500 mg total) by mouth 2 (two) times daily. 09/28/21   Suzzanne Cloud, NP  levothyroxine (SYNTHROID, LEVOTHROID) 88 MCG tablet Take 88 mcg by mouth daily before breakfast.    [provider]  simvastatin (ZOCOR) 20 MG tablet Take 20 mg by mouth daily at 6 PM.     [provider]  tamsulosin (FLOMAX) 0.4 MG CAPS  capsule Take 0.4 mg by mouth daily.     [provider]  TESTOSTERONE COMPOUNDING KIT TD Place 1 mL onto the skin every morning.    [provider]  timolol (TIMOPTIC) 0.5 % ophthalmic solution Place 1 drop into both eyes daily.  10/26/16   [provider]  VITAMIN D PO Take 1 tablet by mouth daily.    [provider]      Allergies    Patient has no known allergies.    Review of Systems   Review of Systems  Physical Exam Updated Vital Signs BP 112/80   Pulse 71   Temp 98 F (36.7 C) (Oral)   Resp 14   SpO2 99%  Physical Exam Vitals and nursing note reviewed.  Constitutional:      Appearance: He is well-developed.  HENT:     Head: Normocephalic and atraumatic.  Eyes:     Pupils: Pupils are equal, round, and reactive to light.  Neck:     Vascular: No JVD.  Cardiovascular:     Rate and Rhythm: Normal rate and regular rhythm.     Heart sounds: No murmur heard.    No friction rub. No gallop.  Pulmonary:     Effort: No respiratory distress.     Breath sounds: No wheezing.  Abdominal:     General: There is no distension.  Tenderness: There is no abdominal tenderness. There is no guarding or rebound.  Musculoskeletal:        General: Normal range of motion.     Cervical back: Normal range of motion and neck supple.  Skin:    Coloration: Skin is not pale.     Findings: No rash.  Neurological:     Mental Status: He is alert and oriented to person, place, and time.  Psychiatric:        Behavior: Behavior normal.     ED Results / Procedures / Treatments   Labs (all labs ordered are listed, but only abnormal results are displayed) Labs Reviewed  BASIC METABOLIC PANEL - Abnormal; Notable for the following components:      Result Value   Sodium 130 (*)    Potassium 2.9 (*)    Chloride 90 (*)    Glucose, Bld 466 (*)    BUN 37 (*)    Creatinine, Ser 4.30 (*)    Calcium 8.8 (*)    GFR, Estimated 14 (*)    All other components  within normal limits  CBC - Abnormal; Notable for the following components:   MCV 75.2 (*)    MCH 25.1 (*)    All other components within normal limits  URINALYSIS, ROUTINE W REFLEX MICROSCOPIC - Abnormal; Notable for the following components:   APPearance CLOUDY (*)    Glucose, UA >=500 (*)    Protein, ur >=300 (*)    Leukocytes,Ua LARGE (*)    Non Squamous Epithelial 0-5 (*)    All other components within normal limits  CBG MONITORING, ED - Abnormal; Notable for the following components:   Glucose-Capillary 435 (*)    All other components within normal limits  MAGNESIUM    EKG None  Radiology No results found.  Procedures .Critical Care  Performed by: Deno Etienne, DO Authorized by: Deno Etienne, DO   Critical care provider statement:    Critical care time (minutes):  35   Critical care time was exclusive of:  Separately billable procedures and treating other patients   Critical care was time spent personally by me on the following activities:  Development of treatment plan with patient or surrogate, discussions with consultants, evaluation of patient's response to treatment, examination of patient, ordering and review of laboratory studies, ordering and review of radiographic studies, ordering and performing treatments and interventions, pulse oximetry, re-evaluation of patient's condition and review of old charts   Care discussed with: admitting provider       Medications Ordered in ED Medications  sodium chloride 0.9 % bolus 1,000 mL (0 mLs Intravenous Stopped 06/05/22 1054)  potassium chloride 10 mEq in 100 mL IVPB (0 mEq Intravenous Stopped 06/05/22 1154)  potassium chloride SA (KLOR-CON M) CR tablet 40 mEq (40 mEq Oral Given 06/05/22 1045)  magnesium oxide (MAG-OX) tablet 800 mg (800 mg Oral Given 06/05/22 1045)  sodium chloride 0.9 % bolus 1,000 mL (1,000 mLs Intravenous New Bag/Given 06/05/22 1117)    ED Course/ Medical Decision Making/ A&P                              Medical Decision Making Amount and/or Complexity of Data Reviewed Labs: ordered. ECG/medicine tests: ordered.  Risk OTC drugs. Prescription drug management.   73 yo M with a cc of abnormal labs.  Feels like he has otherwise done well.  He is found to be borderline hypotensive here with  worsening renal dysfunction and worsening hypokalemia.  Started on IV fluids here.  Discussed with Dr. Melvia Heaps, nephrology recommended IV fluids send medical admission.  The patients results and plan were reviewed and discussed.   Any x-rays performed were independently reviewed by myself.   Differential diagnosis were considered with the presenting HPI.    Medications  sodium chloride 0.9 % bolus 1,000 mL (0 mLs Intravenous Stopped 06/05/22 1054)  potassium chloride 10 mEq in 100 mL IVPB (0 mEq Intravenous Stopped 06/05/22 1154)  potassium chloride SA (KLOR-CON M) CR tablet 40 mEq (40 mEq Oral Given 06/05/22 1045)  magnesium oxide (MAG-OX) tablet 800 mg (800 mg Oral Given 06/05/22 1045)  sodium chloride 0.9 % bolus 1,000 mL (1,000 mLs Intravenous New Bag/Given 06/05/22 1117)    Vitals:   06/05/22 1100 06/05/22 1117 06/05/22 1130 06/05/22 1200  BP: 112/88  113/83 112/80  Pulse: 77  72 71  Resp: '14  11 14  '$ Temp:  98 F (36.7 C)    TempSrc:  Oral    SpO2: 100%  100% 99%    Final diagnoses:  AKI (acute kidney injury) ([place])  Hypovolemia  Hypokalemia    Admission/ observation were discussed with the admitting physician, patient and/or family and they are comfortable with the plan.          Final Clinical Impression(s) / ED Diagnoses Final diagnoses:  AKI (acute kidney injury) ([place])  Hypovolemia  Hypokalemia    Rx / DC Orders ED Discharge Orders     None         Deno Etienne, DO 06/05/22 1211

## 2022-06-06 DIAGNOSIS — E108 Type 1 diabetes mellitus with unspecified complications: Secondary | ICD-10-CM | POA: Diagnosis not present

## 2022-06-06 DIAGNOSIS — E230 Hypopituitarism: Secondary | ICD-10-CM

## 2022-06-06 DIAGNOSIS — Z7984 Long term (current) use of oral hypoglycemic drugs: Secondary | ICD-10-CM

## 2022-06-06 DIAGNOSIS — E1165 Type 2 diabetes mellitus with hyperglycemia: Secondary | ICD-10-CM | POA: Diagnosis not present

## 2022-06-06 DIAGNOSIS — N179 Acute kidney failure, unspecified: Secondary | ICD-10-CM

## 2022-06-06 LAB — CBC
HCT: 36.1 % — ABNORMAL LOW (ref 39.0–52.0)
Hemoglobin: 12.1 g/dL — ABNORMAL LOW (ref 13.0–17.0)
MCH: 25.4 pg — ABNORMAL LOW (ref 26.0–34.0)
MCHC: 33.5 g/dL (ref 30.0–36.0)
MCV: 75.7 fL — ABNORMAL LOW (ref 80.0–100.0)
Platelets: 168 10*3/uL (ref 150–400)
RBC: 4.77 MIL/uL (ref 4.22–5.81)
RDW: 14.1 % (ref 11.5–15.5)
WBC: 6.5 10*3/uL (ref 4.0–10.5)
nRBC: 0 % (ref 0.0–0.2)

## 2022-06-06 LAB — BASIC METABOLIC PANEL
Anion gap: 10 (ref 5–15)
BUN: 30 mg/dL — ABNORMAL HIGH (ref 8–23)
CO2: 23 mmol/L (ref 22–32)
Calcium: 8.7 mg/dL — ABNORMAL LOW (ref 8.9–10.3)
Chloride: 103 mmol/L (ref 98–111)
Creatinine, Ser: 2.72 mg/dL — ABNORMAL HIGH (ref 0.61–1.24)
GFR, Estimated: 24 mL/min — ABNORMAL LOW (ref >60)
Glucose, Bld: 233 mg/dL — ABNORMAL HIGH (ref 70–99)
Potassium: 3.5 mmol/L (ref 3.5–5.1)
Sodium: 136 mmol/L (ref 135–145)

## 2022-06-06 LAB — HEMOGLOBIN A1C
Hgb A1c MFr Bld: 11.6 % — ABNORMAL HIGH (ref 4.8–5.6)
Mean Plasma Glucose: 286.22 mg/dL

## 2022-06-06 LAB — CBG MONITORING, ED: Glucose-Capillary: 256 mg/dL — ABNORMAL HIGH (ref 70–99)

## 2022-06-06 MED ORDER — INSULIN DEGLUDEC 100 UNIT/ML ~~LOC~~ SOPN: 12.0000 [IU] | PEN_INJECTOR | Freq: Every day | SUBCUTANEOUS | 0 refills | Status: DC

## 2022-06-06 NOTE — Discharge Summary (Signed)
Name: Trevor Kelley MRN: [id number] DOB: [birth year] 72 y.o. PCP: Elmarie Shiley, MD  Date of Admission: 06/05/2022  8:55 AM Date of Discharge: 06/06/2022 2:07 PM Attending Physician: No att. providers found  Discharge Diagnosis: Active Problems:   Diabetes mellitus type 2 in obese (HCC)   Obesity (BMI 30-39.9)   OSA (obstructive sleep apnea)   Panhypopituitarism ([place])   Discharge Medications: Allergies as of 06/06/2022   No Known Allergies      Medication List     STOP taking these medications    furosemide 80 MG tablet Commonly known as: LASIX       TAKE these medications    amLODipine 10 MG tablet Commonly known as: NORVASC Take 10 mg by mouth daily.   calcitRIOL 0.25 MCG capsule Commonly known as: ROCALTROL Take 0.25 mcg by mouth daily.   glipiZIDE 2.5 MG Tabs Take 2.5 mg by mouth 2 (two) times daily before a meal.   hydrocortisone 10 MG tablet Commonly known as: CORTEF Take 10-15 mg by mouth See admin instructions. Take '15mg'$  by mouth every morning and take '10mg'$  by mouth tablet in the evening.   insulin degludec 100 UNIT/ML FlexTouch Pen Commonly known as: TRESIBA Inject 12 Units into the skin daily.   latanoprost 0.005 % ophthalmic solution Commonly known as: XALATAN Place 1 drop into both eyes 2 (two) times daily.   levETIRAcetam 500 MG tablet Commonly known as: KEPPRA Take 1 tablet (500 mg total) by mouth 2 (two) times daily.   levothyroxine 88 MCG tablet Commonly known as: SYNTHROID Take 88 mcg by mouth daily before breakfast.   olmesartan-hydrochlorothiazide 20-12.5 MG tablet Commonly known as: BENICAR HCT Take 1 tablet by mouth daily.   simvastatin 20 MG tablet Commonly known as: ZOCOR Take 20 mg by mouth daily at 6 PM.   tamsulosin 0.4 MG Caps capsule Commonly known as: FLOMAX Take 0.4 mg by mouth daily.   TESTOSTERONE COMPOUNDING KIT TD Place 1 mL onto the skin every morning.   timolol 0.5 % ophthalmic solution Commonly  known as: TIMOPTIC Place 1 drop into both eyes daily.   VITAMIN D PO Take 1 tablet by mouth daily.        Follow-up Appointments:  Follow-up Information     Elmarie Shiley, MD Follow up.   Specialty: Nephrology Contact information: [address] [place] [address] [phone]         Marcene Corning, MD. Schedule an appointment as soon as possible for a visit.   Specialty: Internal Medicine Why: Please make appt to see on Monday or Tuesday (as soon as possible) Contact information: [place] [address] [phone]                 Disposition and follow-up: Trevor Kelley is a 72 y.o. year old admitted for hyperglycemia, hypokalemia, and AKI.  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  Contributing factors include hyperglycemia and decreased appetite.  Also on Lasix at home.  Resolved with IV fluids. - Recommended discontinuing Lasix for now - Follow-up BMP  Hematuria Pyuria No bacteriuria.  Do not suspect UTI.  Low index of suspicion for stones.  Renal ultrasound ruled out obstruction. - Follow-up UA for resolution  Hyperglycemia CBG approximately 450 on admission.  No DKA or HHS.  Hemoglobin A1c is 11.6. 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  Baseline creatinine around 2.  Has been seen recently by outpatient endocrinologist with steadily increasing creatinine over the last 4 days.  This does coincide with the administration of contrast for what this patient reported was an MRI, however I am not aware of an association between gadolinium and AKI.  Other than insulin, this patient has not started any new medications.  UA notable for hematuria, pyuria, and hyaline casts.  Renal ultrasound notable for prostate  enlargement but no hydronephrosis. No flank pain or frank hematuria makes stone less likely. Given history of severe hyperglycemia and decreased appetite I suspect that hypovolemia is a significant contributor.  Resolved overnight with IV fluid hydration.  Insulin-dependent type 2 diabetes with hyperglycemia Hypokalemia Per chart review, patient's diabetes has been well-controlled for several years.  Uncertain as to what caused the sudden hyperglycemia.  No evidence of DKA or HHS but I do suspect that this patient is significantly volume depleted.  Continuing IV fluids and will start mealtime and nightly correctional dose insulin.  Hypokalemic in setting of decreased appetite and home furosemide. Potassium replaced in the emergency department.  The patient was discharged with instructions to follow-up with his endocrinologist and continue his insulin and close CBG monitoring.  Discharge Exam: Feels well.  No urinary symptoms.  Eager for discharge.   Blood pressure (!) 135/93, pulse 77, temperature 98 F (36.7 C), temperature source Oral, resp. rate 19, height 6' (1.829 m), weight 108 kg, SpO2 95 %.  Comfortable appearing Heart rate and rhythm are regular, bilateral radial pulses 2+ Breathing is regular and unlabored, lungs clear to auscultation Abdomen is soft and non-tender Skin is warm and dry Alert and oriented, speech is fluent and normal, no gross focal neurologic deficits Pleasant, mood and affect concordant  Pertinent studies and procedures:  Renal ultrasound IMPRESSION: 1. No significant abnormalities in the kidneys or bladder. 2. Enlarged prostate with a volume of 162 cc.   Latest Reference Range & Units 06/06/22 05:15  Sodium 135 - 145 mmol/L 136  Potassium 3.5 - 5.1 mmol/L 3.5  Chloride 98 - 111 mmol/L 103  CO2 22 - 32 mmol/L 23  Glucose 70 - 99 mg/dL 233 (H)  Mean Plasma Glucose mg/dL 286.22  BUN 8 - 23 mg/dL 30 (H)  Creatinine 0.61 - 1.24 mg/dL 2.72 (H)  Calcium 8.9 -  10.3 mg/dL 8.7 (L)  Anion gap 5 - 15  10  GFR, Estimated >60 mL/min 24 (L)  (H): Data is abnormally high (L): Data is abnormally low   Latest Reference Range & Units 06/06/22 05:15  WBC 4.0 - 10.5 K/uL 6.5  RBC 4.22 - 5.81 MIL/uL 4.77  Hemoglobin 13.0 - 17.0 g/dL 12.1 (L)  HCT 39.0 - 52.0 % 36.1 (L)  MCV 80.0 - 100.0 fL 75.7 (L)  MCH 26.0 - 34.0 pg 25.4 (L)  MCHC 30.0 - 36.0 g/dL 33.5  RDW 11.5 - 15.5 % 14.1  Platelets 150 - 400 K/uL 168  nRBC 0.0 - 0.2 % 0.0  (L): Data is abnormally low   Latest Reference Range & Units 06/06/22 05:15  Glucose 70 - 99 mg/dL 233 (H)  Hemoglobin A1C 4.8 - 5.6 % 11.6 (H)  (H): Data is abnormally high   Latest Reference Range & Units 06/05/22 08:54  Appearance CLEAR  CLOUDY !  Bilirubin Urine NEGATIVE  NEGATIVE  Color, Urine YELLOW  YELLOW  Glucose, UA NEGATIVE  mg/dL >=500 !  Hgb urine dipstick NEGATIVE  NEGATIVE  Ketones, ur NEGATIVE mg/dL NEGATIVE  Leukocytes,Ua NEGATIVE  LARGE !  Nitrite NEGATIVE  NEGATIVE  pH 5.0 - 8.0  5.0  Protein NEGATIVE mg/dL >=300 !  Specific Gravity, Urine 1.005 - 1.030  1.013  Bacteria, UA NONE SEEN  NONE SEEN  Hyaline Casts, UA  PRESENT  Mucus  PRESENT  Non Squamous Epithelial NONE SEEN  0-5 !  RBC / HPF 0 - 5 RBC/hpf 21-50  Squamous Epithelial / HPF 0 - 5 /HPF 0-5  WBC Clumps  PRESENT  WBC, UA 0 - 5 WBC/hpf >50  !: Data is abnormal  Discharge Instructions:   Discharge Instructions      Trevor Kelley  You were treated in the hospital for high blood sugar, low potassium, and dehydration. You were treated with intravenous fluids, insulin, and potassium. We are discharging you now that you are doing better. To help assist you on your road to recovery, I have written the following recommendations:   Stop taking Lasix. This medicine can cause worsened dehydration and low potassium. Don't restart this medication before you talk to your doctor.  Continue taking your insulin and checking your blood  sugar regularly. Keep a log of your blood sugar to share with your doctor when you see them.  It was a privilege to be a part of your hospital care team, and I hope you feel better as a result of your stay.  All the best, Nani Gasser, MD     Nani Gasser MD 06/06/2022, 2:07 PM

## 2022-06-06 NOTE — Discharge Instructions (Addendum)
[name]  You were treated in the hospital for high blood sugar, low potassium, and dehydration. You were treated with intravenous fluids, insulin, and potassium. We are discharging you now that you are doing better. To help assist you on your road to recovery, I have written the following recommendations:   Stop taking Lasix. This medicine can cause worsened dehydration and low potassium. Don't restart this medication before you talk to your doctor.  Continue taking your insulin and checking your blood sugar regularly. Keep a log of your blood sugar to share with your doctor when you see them.  It was a privilege to be a part of your hospital care team, and I hope you feel better as a result of your stay.  All the best, Nani Gasser, MD

## 2022-06-10 DIAGNOSIS — Z9889 Other specified postprocedural states: Secondary | ICD-10-CM | POA: Diagnosis not present

## 2022-06-10 DIAGNOSIS — E119 Type 2 diabetes mellitus without complications: Secondary | ICD-10-CM | POA: Diagnosis not present

## 2022-06-10 DIAGNOSIS — I10 Essential (primary) hypertension: Secondary | ICD-10-CM | POA: Diagnosis not present

## 2022-06-10 DIAGNOSIS — G9389 Other specified disorders of brain: Secondary | ICD-10-CM | POA: Diagnosis not present

## 2022-06-10 DIAGNOSIS — Z86011 Personal history of benign neoplasm of the brain: Secondary | ICD-10-CM | POA: Diagnosis not present

## 2022-06-10 DIAGNOSIS — Z0100 Encounter for examination of eyes and vision without abnormal findings: Secondary | ICD-10-CM | POA: Diagnosis not present

## 2022-06-16 DIAGNOSIS — E1165 Type 2 diabetes mellitus with hyperglycemia: Secondary | ICD-10-CM | POA: Diagnosis not present

## 2022-06-16 DIAGNOSIS — Z794 Long term (current) use of insulin: Secondary | ICD-10-CM | POA: Diagnosis not present

## 2022-06-28 DIAGNOSIS — E119 Type 2 diabetes mellitus without complications: Secondary | ICD-10-CM | POA: Diagnosis not present

## 2022-06-28 DIAGNOSIS — H401132 Primary open-angle glaucoma, bilateral, moderate stage: Secondary | ICD-10-CM | POA: Diagnosis not present

## 2022-06-28 DIAGNOSIS — H2513 Age-related nuclear cataract, bilateral: Secondary | ICD-10-CM | POA: Diagnosis not present

## 2022-06-28 DIAGNOSIS — H53433 Sector or arcuate defects, bilateral: Secondary | ICD-10-CM | POA: Diagnosis not present

## 2022-07-06 DIAGNOSIS — E1122 Type 2 diabetes mellitus with diabetic chronic kidney disease: Secondary | ICD-10-CM | POA: Diagnosis not present

## 2022-07-06 DIAGNOSIS — E038 Other specified hypothyroidism: Secondary | ICD-10-CM | POA: Diagnosis not present

## 2022-07-06 DIAGNOSIS — E669 Obesity, unspecified: Secondary | ICD-10-CM | POA: Diagnosis not present

## 2022-07-06 DIAGNOSIS — I129 Hypertensive chronic kidney disease with stage 1 through stage 4 chronic kidney disease, or unspecified chronic kidney disease: Secondary | ICD-10-CM | POA: Diagnosis not present

## 2022-07-06 DIAGNOSIS — E1136 Type 2 diabetes mellitus with diabetic cataract: Secondary | ICD-10-CM | POA: Diagnosis not present

## 2022-07-06 DIAGNOSIS — E1169 Type 2 diabetes mellitus with other specified complication: Secondary | ICD-10-CM | POA: Diagnosis not present

## 2022-07-06 DIAGNOSIS — Z79890 Hormone replacement therapy (postmenopausal): Secondary | ICD-10-CM | POA: Diagnosis not present

## 2022-07-06 DIAGNOSIS — Z7984 Long term (current) use of oral hypoglycemic drugs: Secondary | ICD-10-CM | POA: Diagnosis not present

## 2022-07-06 DIAGNOSIS — Z794 Long term (current) use of insulin: Secondary | ICD-10-CM | POA: Diagnosis not present

## 2022-07-06 DIAGNOSIS — Z6833 Body mass index (BMI) 33.0-33.9, adult: Secondary | ICD-10-CM | POA: Diagnosis not present

## 2022-07-06 DIAGNOSIS — N184 Chronic kidney disease, stage 4 (severe): Secondary | ICD-10-CM | POA: Diagnosis not present

## 2022-07-06 DIAGNOSIS — E1165 Type 2 diabetes mellitus with hyperglycemia: Secondary | ICD-10-CM | POA: Diagnosis not present

## 2022-08-02 DIAGNOSIS — R972 Elevated prostate specific antigen [PSA]: Secondary | ICD-10-CM | POA: Diagnosis not present

## 2022-08-09 DIAGNOSIS — E291 Testicular hypofunction: Secondary | ICD-10-CM | POA: Diagnosis not present

## 2022-08-09 DIAGNOSIS — R311 Benign essential microscopic hematuria: Secondary | ICD-10-CM | POA: Diagnosis not present

## 2022-08-09 DIAGNOSIS — R351 Nocturia: Secondary | ICD-10-CM | POA: Diagnosis not present

## 2022-08-09 DIAGNOSIS — R972 Elevated prostate specific antigen [PSA]: Secondary | ICD-10-CM | POA: Diagnosis not present

## 2022-08-09 DIAGNOSIS — N401 Enlarged prostate with lower urinary tract symptoms: Secondary | ICD-10-CM | POA: Diagnosis not present

## 2022-09-03 DIAGNOSIS — Z7952 Long term (current) use of systemic steroids: Secondary | ICD-10-CM | POA: Diagnosis not present

## 2022-09-03 DIAGNOSIS — E291 Testicular hypofunction: Secondary | ICD-10-CM | POA: Diagnosis not present

## 2022-09-03 DIAGNOSIS — E230 Hypopituitarism: Secondary | ICD-10-CM | POA: Diagnosis not present

## 2022-09-03 DIAGNOSIS — R809 Proteinuria, unspecified: Secondary | ICD-10-CM | POA: Diagnosis not present

## 2022-09-03 DIAGNOSIS — E1136 Type 2 diabetes mellitus with diabetic cataract: Secondary | ICD-10-CM | POA: Diagnosis not present

## 2022-09-03 DIAGNOSIS — I129 Hypertensive chronic kidney disease with stage 1 through stage 4 chronic kidney disease, or unspecified chronic kidney disease: Secondary | ICD-10-CM | POA: Diagnosis not present

## 2022-09-03 DIAGNOSIS — E2749 Other adrenocortical insufficiency: Secondary | ICD-10-CM | POA: Diagnosis not present

## 2022-09-03 DIAGNOSIS — N1832 Chronic kidney disease, stage 3b: Secondary | ICD-10-CM | POA: Diagnosis not present

## 2022-09-03 DIAGNOSIS — Z794 Long term (current) use of insulin: Secondary | ICD-10-CM | POA: Diagnosis not present

## 2022-09-03 DIAGNOSIS — Z7984 Long term (current) use of oral hypoglycemic drugs: Secondary | ICD-10-CM | POA: Diagnosis not present

## 2022-09-03 DIAGNOSIS — E221 Hyperprolactinemia: Secondary | ICD-10-CM | POA: Diagnosis not present

## 2022-09-03 DIAGNOSIS — E1122 Type 2 diabetes mellitus with diabetic chronic kidney disease: Secondary | ICD-10-CM | POA: Diagnosis not present

## 2022-09-03 DIAGNOSIS — E1165 Type 2 diabetes mellitus with hyperglycemia: Secondary | ICD-10-CM | POA: Diagnosis not present

## 2022-09-03 DIAGNOSIS — Z79890 Hormone replacement therapy (postmenopausal): Secondary | ICD-10-CM | POA: Diagnosis not present

## 2022-09-03 DIAGNOSIS — E038 Other specified hypothyroidism: Secondary | ICD-10-CM | POA: Diagnosis not present

## 2022-09-03 DIAGNOSIS — R7989 Other specified abnormal findings of blood chemistry: Secondary | ICD-10-CM | POA: Diagnosis not present

## 2022-09-13 DIAGNOSIS — N401 Enlarged prostate with lower urinary tract symptoms: Secondary | ICD-10-CM | POA: Diagnosis not present

## 2022-09-27 DIAGNOSIS — N1831 Chronic kidney disease, stage 3a: Secondary | ICD-10-CM | POA: Diagnosis not present

## 2022-09-27 DIAGNOSIS — N390 Urinary tract infection, site not specified: Secondary | ICD-10-CM | POA: Diagnosis not present

## 2022-09-29 ENCOUNTER — Ambulatory Visit: Payer: Medicare Other | Admitting: Neurology

## 2022-10-05 ENCOUNTER — Encounter: Payer: Self-pay | Admitting: Neurology

## 2022-10-05 ENCOUNTER — Telehealth: Payer: Self-pay

## 2022-10-05 ENCOUNTER — Ambulatory Visit (INDEPENDENT_AMBULATORY_CARE_PROVIDER_SITE_OTHER): Payer: Medicare Other | Admitting: Neurology

## 2022-10-05 ENCOUNTER — Other Ambulatory Visit: Payer: Self-pay | Admitting: Neurology

## 2022-10-05 VITALS — BP 158/90 | HR 65 | Ht 72.0 in | Wt 246.4 lb

## 2022-10-05 DIAGNOSIS — G40909 Epilepsy, unspecified, not intractable, without status epilepticus: Secondary | ICD-10-CM

## 2022-10-05 DIAGNOSIS — I129 Hypertensive chronic kidney disease with stage 1 through stage 4 chronic kidney disease, or unspecified chronic kidney disease: Secondary | ICD-10-CM | POA: Diagnosis not present

## 2022-10-05 DIAGNOSIS — D631 Anemia in chronic kidney disease: Secondary | ICD-10-CM | POA: Diagnosis not present

## 2022-10-05 DIAGNOSIS — N2581 Secondary hyperparathyroidism of renal origin: Secondary | ICD-10-CM | POA: Diagnosis not present

## 2022-10-05 DIAGNOSIS — N1832 Chronic kidney disease, stage 3b: Secondary | ICD-10-CM | POA: Diagnosis not present

## 2022-10-05 DIAGNOSIS — G4733 Obstructive sleep apnea (adult) (pediatric): Secondary | ICD-10-CM | POA: Diagnosis not present

## 2022-10-05 MED ORDER — LEVETIRACETAM 500 MG PO TABS: 500.0000 mg | ORAL_TABLET | Freq: Two times a day (BID) | ORAL | 4 refills | Status: DC

## 2022-10-05 NOTE — Telephone Encounter (Signed)
Bipap order sent to [facility]

## 2022-10-05 NOTE — Progress Notes (Addendum)
Patient: Trevor Kelley Date of Birth: 1950/11/16  Reason for Visit: Follow up  History from: Patient Primary Neurologist: Dr. Vickey Huger   ASSESSMENT AND PLAN 72 y.o. year old male   Sleep apnea on BiPAP -Download shows good compliance, encouraged to continue nightly use, greater than 4 hours -Order sent to DME for continued supplies   2.  History of meningioma 2015, status postresection 3.  History of seizures, right frontotemporal encephalomalacia -Single seizure in 2019, no recurrent events -Continue Keppra 500 mg twice a day, refilled -Follow-up in 1 year or sooner if needed  Addendum 06/30/23 SS: Received a note from Dr. 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  MRI showed enlargement of the adenoid tissue.  Discussed options for management, gamma knife.  He initially had resection in 2015.  Follows with [facility] neurosurgery.  HISTORY OF PRESENT ILLNESS: Today 10/05/22 Here today for follow-up. Doing great. No seizures, remains on Keppra, needs a refill. BIPAP is doing great, no issues, wears full face mask. Feels good energy during the day, good quality sleep. Has been placed on testosterone gel, has helped energy.  Health is good, wearing continuous glucose monitor, going to the gym 3 days a week.   Review of CPAP data 30/30 days 100%, greater than 4 hours 100%.  Average usage 7 hours 32 minutes.  Max IPAP 14, Min EPAP 9.  Pressure support 4 cm water.  Leak 11.5, AHI 3.0.  ESS 4.  09/28/21 SS: Trevor Kelley is here today for initial bipap sleep consult. HST 08/12/21 showed severe sleep apnea with atypical distribution between REM and non-REM sleep.  More typical for central sleep apnea.  No significant oxygen desaturation.  He got a new BiPAP machine.  He is enjoying it.  Brief interval history, in the past was on CPAP, then switched to BiPAP.  Just discharged from hospitalization for gout flare.  Feeling much better.  Review of BiPAP data  attached in note, shows overall good compliance, he did miss a few days when he was suffering from a gout flare.  AHI is 3.1.  He uses full facemask, leak in the 95th percentile was 20.3, looking at the bar graph, most of the leak was associated on the days he cannot get comfortable from pain.  In regards to his seizures, he had meningioma resection in 2015, had a single seizure in 2019, has remained on Keppra since.  He has not had any further events. Denies headache or other neuro symptom. He lives with his wife, drives a car.  ESS was 6.  He is quite pleasant, feels much better with BiPAP, wishes to continue.  HISTORY  Copied Dr. Vickey Huger 06/23/2021: Trevor Kelley is a 72 y.o. year old Black or [place] American male patient seen here as a referral on 06/23/2021 from Dr Anne Hahn for a transfer of CPAP care.   Trevor Kelley  has a past medical history of CKD (chronic kidney disease), Diabetes mellitus without complication (HCC), History of pituitary adenoma, Hypertension, Hypothyroidism, OSA (obstructive sleep apnea), and Seizure disorder (HCC) (05/31/2017).   Chief concern according to patient :  Referral from Dr. Anne Hahn for OSA/on CPAP. He followed the patient for seizure disorder/never followed this patient for OSA or CPAP.  The patient had a pituitary adenoma, brain tumor surgery at [facility]- in 2015, following a reduction in visual fields.    Panhypopituitarism (HCC) (Primary Dx);  Secondary adrenal insufficiency (HCC);  Secondary hypothyroidism;  Diabetes mellitus  type 2 in obese [facility]);  Secondary male hypogonadism;  Pituitary adenoma (HCC);  Hyperprolactinemia (HCC)       and was from there [facility]) referred  for a sleep test. Dx with OSA-  Set up w/ current machine ( 06-23-2021) by Patsy Lager 02/17/2015.   Does not need supplies. He is requesting rx for travel cpap. Aware may have to pay out- of pocket- typically not covered by insurance. His reports his renal function has been stabile since  2016.    Sleep relevant medical history: pituitary surgery for adenoma, pan hypo-pit. Not transnasal .   Family medical /sleep history: no  other family member on CPAP with OSA,.    Social history:  Patient is retired from delivery truck driving and lives in a household with spouse,  the couple has 2 sons, adult. 73 year old twins, no grandchildren. He used to drive irregular hours, early to late.  Pets are not present. Tobacco use-none .  ETOH use - never ,  Caffeine intake in form of Coffee( once a month) Soda( one a week) . Regular exercise in the gym, 3 times a week. .       Sleep habits are as follows: The patient's dinner time is between  5-6 PM. The patient goes to bed at 10 PM and is  promptly asleep, continues to sleep for 6 hours. The preferred sleep position is on the right side, with the support of 1-3 pillows.  Dreams are reportedly frequent/vivid.  6.30  AM is the usual rise time. The patient wakes up spontaneously.  He reports not feeling refreshed or restored in AM, with symptoms such as dry mouth, and residual fatigue. Naps are taken infrequently, lasting from 20 to 40 minutes and are more refreshing than nocturnal sleep.      The patient's sleep study took place at [facility] in [place] 2016.  The patient was diagnosed with severe obstructive sleep apnea and titrated first to CPAP which she did not tolerate well he was then switched to BiPAP and to a final pressure of 14 over 9 cm water pressure with an AHI of 0 and a total sleep time of 40.5 minutes at that pressure.  He continues to use his current BiPAP at exactly the same setting with a residual AHI of 3.5 which is excellent.  He has equal amounts of central and obstructive apneas but the residual AHI is low enough to ignore that.  He does have moderate to severe leakage with the current mask.  He is 97% compliant by days and time with an average use of 8 hours 10 minutes.  A machine for travel  purposes need to be a BiPAP machine.  REVIEW OF SYSTEMS: Out of a complete 14 system review of symptoms, the patient complains only of the following symptoms, and all other reviewed systems are negative.  See HPI  ALLERGIES: No Known Allergies  HOME MEDICATIONS: Outpatient Medications Prior to Visit  Medication Sig Dispense Refill   amLODipine (NORVASC) 10 MG tablet Take 10 mg by mouth daily.     calcitRIOL (ROCALTROL) 0.25 MCG capsule Take 0.25 mcg by mouth daily.     glipiZIDE 2.5 MG TABS Take 2.5 mg by mouth 2 (two) times daily before a meal.     hydrocortisone (CORTEF) 10 MG tablet Take 10-15 mg by mouth See admin instructions. Take 15mg  by mouth every morning and take 10mg  by mouth tablet in the evening.  insulin degludec (TRESIBA) 100 UNIT/ML FlexTouch Pen Inject 12 Units into the skin daily. 3 mL 0   latanoprost (XALATAN) 0.005 % ophthalmic solution Place 1 drop into both eyes 2 (two) times daily.     levothyroxine (SYNTHROID, LEVOTHROID) 88 MCG tablet Take 88 mcg by mouth daily before breakfast.     olmesartan-hydrochlorothiazide (BENICAR HCT) 20-12.5 MG tablet Take 1 tablet by mouth daily.     simvastatin (ZOCOR) 20 MG tablet Take 20 mg by mouth daily at 6 PM.      tamsulosin (FLOMAX) 0.4 MG CAPS capsule Take 0.4 mg by mouth daily.      TESTOSTERONE COMPOUNDING KIT TD Place 1 mL onto the skin every morning.     timolol (TIMOPTIC) 0.5 % ophthalmic solution Place 1 drop into both eyes daily.      VITAMIN D PO Take 1 tablet by mouth daily.     levETIRAcetam (KEPPRA) 500 MG tablet Take 1 tablet (500 mg total) by mouth 2 (two) times daily. 180 tablet 3   No facility-administered medications prior to visit.    PAST MEDICAL HISTORY: Past Medical History:  Diagnosis Date   CKD (chronic kidney disease)    Diabetes mellitus without complication (HCC)    History of pituitary adenoma    Hypertension    Hypothyroidism    OSA (obstructive sleep apnea)    Seizure disorder (HCC)  05/31/2017    PAST SURGICAL HISTORY: Past Surgical History:  Procedure Laterality Date   BRAIN SURGERY      FAMILY HISTORY: Family History  Problem Relation Age of Onset   Cancer - Colon Mother    Hypothyroidism Other    Diabetes Mellitus II Other    Cancer Other    Hypertension Other     SOCIAL HISTORY: Social History   Socioeconomic History   Marital status: Married    Spouse name: Gavin Pound   Number of children: 2   Years of education: 12   Highest education level: Not on file  Occupational History   Occupation: Retired  Tobacco Use   Smoking status: Never   Smokeless tobacco: Never  Vaping Use   Vaping Use: Never used  Substance and Sexual Activity   Alcohol use: Yes    Comment: occassional   Drug use: No   Sexual activity: Never  Other Topics Concern   Not on file  Social History Narrative   Lives with wife   Married, 2 children   Right handed   12 th grade   Caffeine use: 1 per month or less   Social Determinants of Health   Financial Resource Strain: Not on file  Food Insecurity: Not on file  Transportation Needs: Not on file  Physical Activity: Not on file  Stress: Not on file  Social Connections: Not on file  Intimate Partner Violence: Not on file    PHYSICAL EXAM  Vitals:   10/05/22 0820 10/05/22 0837  BP: (!) 155/96 (!) 158/90  Pulse: 65   Weight: 246 lb 6.4 oz (111.8 kg)   Height: 6' (1.829 m)     Body mass index is 33.42 kg/m.  Generalized: Well developed, in no acute distress  Neurological examination  Mentation: Alert oriented to time, place, history taking. Follows all commands speech and language fluent Cranial nerve II-XII: Pupils were equal round reactive to light. Extraocular movements were full, visual field were full on confrontational test. Facial sensation and strength were normal. Head turning and shoulder shrug  were normal and symmetric. Motor:  The motor testing reveals 5 over 5 strength of all 4 extremities. Good  symmetric motor tone is noted throughout.  Sensory: Sensory testing is intact to soft touch on all 4 extremities. No evidence of extinction is noted.  Coordination: Cerebellar testing reveals good finger-nose-finger and heel-to-shin bilaterally.  Gait and station: Gait is normal and independent. Reflexes: Deep tendon reflexes are symmetric and normal bilaterally.   DIAGNOSTIC DATA (LABS, IMAGING, TESTING) - I reviewed patient records, labs, notes, testing and imaging myself where available.  Lab Results  Component Value Date   WBC 6.5 06/06/2022   HGB 12.1 (L) 06/06/2022   HCT 36.1 (L) 06/06/2022   MCV 75.7 (L) 06/06/2022   PLT 168 06/06/2022      Component Value Date/Time   NA 136 06/06/2022 0515   K 3.5 06/06/2022 0515   CL 103 06/06/2022 0515   CO2 23 06/06/2022 0515   GLUCOSE 233 (H) 06/06/2022 0515   BUN 30 (H) 06/06/2022 0515   CREATININE 2.72 (H) 06/06/2022 0515   CALCIUM 8.7 (L) 06/06/2022 0515   PROT 7.4 09/24/2021 0740   ALBUMIN 3.1 (L) 09/24/2021 0740   AST 19 09/24/2021 0740   ALT 15 09/24/2021 0740   ALKPHOS 61 09/24/2021 0740   BILITOT 1.8 (H) 09/24/2021 0740   GFRNONAA 24 (L) 06/06/2022 0515   GFRAA 29 (L) 02/26/2018 0350   Lab Results  Component Value Date   CHOL 122 09/03/2013   HDL 30 (L) 09/03/2013   LDLCALC 71 09/03/2013   TRIG 103 09/03/2013   Lab Results  Component Value Date   HGBA1C 11.6 (H) 06/06/2022   Lab Results  Component Value Date   VITAMINB12 302 12/31/2016   No results found for: "TSH"  Margie Ege, AGNP-C, DNP 10/05/2022, 8:37 AM [facility] [address] [place], [place] [address] [phone]

## 2022-10-05 NOTE — Telephone Encounter (Signed)
-----   Message from Glean Salvo, NP sent at 10/05/2022  9:02 AM EDT ----- Please send order to DME for continued supplies.  Thanks, Maralyn Sago

## 2022-10-26 DIAGNOSIS — N1832 Chronic kidney disease, stage 3b: Secondary | ICD-10-CM | POA: Diagnosis not present

## 2022-11-02 DIAGNOSIS — H53433 Sector or arcuate defects, bilateral: Secondary | ICD-10-CM | POA: Diagnosis not present

## 2022-11-02 DIAGNOSIS — H401132 Primary open-angle glaucoma, bilateral, moderate stage: Secondary | ICD-10-CM | POA: Diagnosis not present

## 2022-11-02 DIAGNOSIS — E119 Type 2 diabetes mellitus without complications: Secondary | ICD-10-CM | POA: Diagnosis not present

## 2022-12-03 DIAGNOSIS — E1169 Type 2 diabetes mellitus with other specified complication: Secondary | ICD-10-CM | POA: Diagnosis not present

## 2022-12-03 DIAGNOSIS — E785 Hyperlipidemia, unspecified: Secondary | ICD-10-CM | POA: Diagnosis not present

## 2022-12-03 DIAGNOSIS — E2749 Other adrenocortical insufficiency: Secondary | ICD-10-CM | POA: Diagnosis not present

## 2022-12-03 DIAGNOSIS — R7989 Other specified abnormal findings of blood chemistry: Secondary | ICD-10-CM | POA: Diagnosis not present

## 2022-12-03 DIAGNOSIS — E11649 Type 2 diabetes mellitus with hypoglycemia without coma: Secondary | ICD-10-CM | POA: Diagnosis not present

## 2022-12-03 DIAGNOSIS — E291 Testicular hypofunction: Secondary | ICD-10-CM | POA: Diagnosis not present

## 2022-12-03 DIAGNOSIS — E1165 Type 2 diabetes mellitus with hyperglycemia: Secondary | ICD-10-CM | POA: Diagnosis not present

## 2022-12-03 DIAGNOSIS — I129 Hypertensive chronic kidney disease with stage 1 through stage 4 chronic kidney disease, or unspecified chronic kidney disease: Secondary | ICD-10-CM | POA: Diagnosis not present

## 2022-12-03 DIAGNOSIS — E230 Hypopituitarism: Secondary | ICD-10-CM | POA: Diagnosis not present

## 2022-12-03 DIAGNOSIS — Z794 Long term (current) use of insulin: Secondary | ICD-10-CM | POA: Diagnosis not present

## 2022-12-03 DIAGNOSIS — N1832 Chronic kidney disease, stage 3b: Secondary | ICD-10-CM | POA: Diagnosis not present

## 2022-12-03 DIAGNOSIS — R948 Abnormal results of function studies of other organs and systems: Secondary | ICD-10-CM | POA: Diagnosis not present

## 2022-12-03 DIAGNOSIS — N62 Hypertrophy of breast: Secondary | ICD-10-CM | POA: Diagnosis not present

## 2022-12-03 DIAGNOSIS — D329 Benign neoplasm of meninges, unspecified: Secondary | ICD-10-CM | POA: Diagnosis not present

## 2022-12-03 DIAGNOSIS — E669 Obesity, unspecified: Secondary | ICD-10-CM | POA: Diagnosis not present

## 2022-12-03 DIAGNOSIS — E221 Hyperprolactinemia: Secondary | ICD-10-CM | POA: Diagnosis not present

## 2022-12-03 DIAGNOSIS — E038 Other specified hypothyroidism: Secondary | ICD-10-CM | POA: Diagnosis not present

## 2022-12-03 DIAGNOSIS — E1122 Type 2 diabetes mellitus with diabetic chronic kidney disease: Secondary | ICD-10-CM | POA: Diagnosis not present

## 2022-12-14 DIAGNOSIS — H401132 Primary open-angle glaucoma, bilateral, moderate stage: Secondary | ICD-10-CM | POA: Diagnosis not present

## 2022-12-14 DIAGNOSIS — E119 Type 2 diabetes mellitus without complications: Secondary | ICD-10-CM | POA: Diagnosis not present

## 2022-12-14 DIAGNOSIS — H53433 Sector or arcuate defects, bilateral: Secondary | ICD-10-CM | POA: Diagnosis not present

## 2023-02-01 DIAGNOSIS — R972 Elevated prostate specific antigen [PSA]: Secondary | ICD-10-CM | POA: Diagnosis not present

## 2023-02-01 DIAGNOSIS — E291 Testicular hypofunction: Secondary | ICD-10-CM | POA: Diagnosis not present

## 2023-02-09 DIAGNOSIS — R972 Elevated prostate specific antigen [PSA]: Secondary | ICD-10-CM | POA: Diagnosis not present

## 2023-02-09 DIAGNOSIS — R351 Nocturia: Secondary | ICD-10-CM | POA: Diagnosis not present

## 2023-02-09 DIAGNOSIS — E291 Testicular hypofunction: Secondary | ICD-10-CM | POA: Diagnosis not present

## 2023-02-09 DIAGNOSIS — N401 Enlarged prostate with lower urinary tract symptoms: Secondary | ICD-10-CM | POA: Diagnosis not present

## 2023-02-09 DIAGNOSIS — R8271 Bacteriuria: Secondary | ICD-10-CM | POA: Diagnosis not present

## 2023-03-07 DIAGNOSIS — R8271 Bacteriuria: Secondary | ICD-10-CM | POA: Diagnosis not present

## 2023-03-28 DIAGNOSIS — N1832 Chronic kidney disease, stage 3b: Secondary | ICD-10-CM | POA: Diagnosis not present

## 2023-04-06 DIAGNOSIS — D631 Anemia in chronic kidney disease: Secondary | ICD-10-CM | POA: Diagnosis not present

## 2023-04-06 DIAGNOSIS — N1832 Chronic kidney disease, stage 3b: Secondary | ICD-10-CM | POA: Diagnosis not present

## 2023-04-06 DIAGNOSIS — I129 Hypertensive chronic kidney disease with stage 1 through stage 4 chronic kidney disease, or unspecified chronic kidney disease: Secondary | ICD-10-CM | POA: Diagnosis not present

## 2023-04-06 DIAGNOSIS — N2581 Secondary hyperparathyroidism of renal origin: Secondary | ICD-10-CM | POA: Diagnosis not present

## 2023-04-13 DIAGNOSIS — N401 Enlarged prostate with lower urinary tract symptoms: Secondary | ICD-10-CM | POA: Diagnosis not present

## 2023-04-13 DIAGNOSIS — R351 Nocturia: Secondary | ICD-10-CM | POA: Diagnosis not present

## 2023-04-13 DIAGNOSIS — R8271 Bacteriuria: Secondary | ICD-10-CM | POA: Diagnosis not present

## 2023-04-20 DIAGNOSIS — H401132 Primary open-angle glaucoma, bilateral, moderate stage: Secondary | ICD-10-CM | POA: Diagnosis not present

## 2023-06-02 ENCOUNTER — Encounter: Payer: Self-pay | Admitting: Internal Medicine

## 2023-06-02 ENCOUNTER — Ambulatory Visit: Payer: Medicare Other | Admitting: Internal Medicine

## 2023-06-02 VITALS — BP 120/88 | HR 79 | Temp 98.2°F | Ht 72.0 in | Wt 233.6 lb

## 2023-06-02 DIAGNOSIS — E108 Type 1 diabetes mellitus with unspecified complications: Secondary | ICD-10-CM | POA: Diagnosis not present

## 2023-06-02 DIAGNOSIS — Z23 Encounter for immunization: Secondary | ICD-10-CM

## 2023-06-02 DIAGNOSIS — E230 Hypopituitarism: Secondary | ICD-10-CM

## 2023-06-02 DIAGNOSIS — G4733 Obstructive sleep apnea (adult) (pediatric): Secondary | ICD-10-CM | POA: Diagnosis not present

## 2023-06-02 DIAGNOSIS — Z86011 Personal history of benign neoplasm of the brain: Secondary | ICD-10-CM

## 2023-06-02 DIAGNOSIS — N1832 Chronic kidney disease, stage 3b: Secondary | ICD-10-CM | POA: Diagnosis not present

## 2023-06-02 DIAGNOSIS — I10 Essential (primary) hypertension: Secondary | ICD-10-CM | POA: Diagnosis not present

## 2023-06-02 LAB — POCT GLYCOSYLATED HEMOGLOBIN (HGB A1C): Hemoglobin A1C: 6.7 % — AB (ref 4.0–5.6)

## 2023-06-02 NOTE — Progress Notes (Signed)
New Patient Office Visit     CC/Reason for Visit: Establish care, discuss chronic medical conditions Previous PCP: Unknown Last Visit: Unknown  HPI: Trevor Kelley is a 73 y.o. male who is coming in today for the above mentioned reasons. Past Medical History is significant for: Chronic kidney disease stage IIIb followed by Dr. Allena Katz.  To the best of my understanding he started having some visual field defects in 2015 at which point an MRI was done which showed a large cerebral meningioma that required resection.  Subsequently to that he developed panhypopituitarism.  This led to diabetes, hypothyroidism and seizure disorder that has been well-controlled.  He also has a history of hypertension, hyperlipidemia, BPH and obstructive sleep apnea that is managed on BiPAP.  He follows with an endocrinologist affiliated with [facility] and I will try and obtain records.  He is feeling well today.  He was hospitalized in January by the internal medicine teaching service for uncontrolled diabetes with hyperglycemia.  His A1c at that time was 11.6.  Today he is feeling well.   Past Medical/Surgical History: Past Medical History:  Diagnosis Date   CKD (chronic kidney disease)    Diabetes mellitus without complication (HCC)    DM (diabetes mellitus), type 2 (HCC)    History of cerebral meningioma    History of pituitary adenoma    Hypertension    Hypothyroidism    OSA (obstructive sleep apnea)    Panhypopituitarism (HCC)    Seizure disorder (HCC) 05/31/2017    Past Surgical History:  Procedure Laterality Date   BRAIN SURGERY      Social History:  reports that he has never smoked. He has never used smokeless tobacco. He reports that he does not drink alcohol and does not use drugs.  Allergies: No Known Allergies  Family History:  Family History  Problem Relation Age of Onset   Cancer - Colon Mother    Hypothyroidism Other    Diabetes Mellitus II Other    Cancer Other     Hypertension Other      Current Outpatient Medications:    amLODipine (NORVASC) 10 MG tablet, Take 10 mg by mouth daily., Disp: , Rfl:    calcitRIOL (ROCALTROL) 0.25 MCG capsule, Take 0.25 mcg by mouth daily., Disp: , Rfl:    Finerenone (KERENDIA) 10 MG TABS, Take by mouth., Disp: , Rfl:    hydrocortisone (CORTEF) 10 MG tablet, Take 10-15 mg by mouth See admin instructions. Take 15mg  by mouth every morning and take 10mg  by mouth tablet in the evening., Disp: , Rfl:    insulin degludec (TRESIBA) 100 UNIT/ML FlexTouch Pen, Inject 12 Units into the skin daily. (Patient taking differently: Inject 8 Units into the skin daily.), Disp: 3 mL, Rfl: 0   latanoprost (XALATAN) 0.005 % ophthalmic solution, Place 1 drop into both eyes 2 (two) times daily., Disp: , Rfl:    levETIRAcetam (KEPPRA) 500 MG tablet, Take 1 tablet (500 mg total) by mouth 2 (two) times daily., Disp: 180 tablet, Rfl: 4   levothyroxine (SYNTHROID, LEVOTHROID) 88 MCG tablet, Take 88 mcg by mouth daily before breakfast., Disp: , Rfl:    olmesartan-hydrochlorothiazide (BENICAR HCT) 20-12.5 MG tablet, Take 1 tablet by mouth daily., Disp: , Rfl:    simvastatin (ZOCOR) 20 MG tablet, Take 20 mg by mouth daily at 6 PM. , Disp: , Rfl:    tamsulosin (FLOMAX) 0.4 MG CAPS capsule, Take 0.4 mg by mouth daily. , Disp: , Rfl:  TESTOSTERONE COMPOUNDING KIT TD, Place 1 mL onto the skin every morning., Disp: , Rfl:    timolol (TIMOPTIC) 0.5 % ophthalmic solution, Place 1 drop into both eyes daily. , Disp: , Rfl:    VITAMIN D PO, Take 1 tablet by mouth daily., Disp: , Rfl:   Review of Systems:  Negative except as indicated in HPI.   Physical Exam: Vitals:   06/02/23 1304  BP: 120/88  Pulse: 79  Temp: 98.2 F (36.8 C)  TempSrc: Oral  SpO2: 98%  Weight: 233 lb 9.6 oz (106 kg)  Height: 6' (1.829 m)   Body mass index is 31.68 kg/m.  Physical Exam Vitals reviewed.  Constitutional:      Appearance: Normal appearance.  HENT:     Head:  Normocephalic and atraumatic.  Eyes:     Conjunctiva/sclera: Conjunctivae normal.     Pupils: Pupils are equal, round, and reactive to light.  Cardiovascular:     Rate and Rhythm: Normal rate and regular rhythm.  Pulmonary:     Effort: Pulmonary effort is normal.     Breath sounds: Normal breath sounds.  Skin:    General: Skin is warm and dry.  Neurological:     Mental Status: He is alert and oriented to person, place, and time.  Psychiatric:        Mood and Affect: Mood normal.        Behavior: Behavior normal.        Thought Content: Thought content normal.        Judgment: Judgment normal.     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  He will continue follow-up with his endocrinologist.  Obtain records. -Flu and PCV 20 given in office today.  He has been advised to update Tdap, RSV, COVID vaccines at pharmacy. -He had a colonoscopy in June 2022. -He is overdue for Medicare wellness visit and will return in 5 months to have this done. -He has routine follow-up with ophthalmology.   Time spent: 47 minutes reviewing chart, interviewing and examining patient and formulating plan of care.        Chaya Jan, MD [facility]

## 2023-06-20 DIAGNOSIS — D329 Benign neoplasm of meninges, unspecified: Secondary | ICD-10-CM | POA: Diagnosis not present

## 2023-06-20 DIAGNOSIS — D352 Benign neoplasm of pituitary gland: Secondary | ICD-10-CM | POA: Diagnosis not present

## 2023-06-27 DIAGNOSIS — D352 Benign neoplasm of pituitary gland: Secondary | ICD-10-CM | POA: Diagnosis not present

## 2023-06-30 ENCOUNTER — Other Ambulatory Visit: Payer: Self-pay

## 2023-06-30 MED ORDER — LEVETIRACETAM 500 MG PO TABS: 500.0000 mg | ORAL_TABLET | Freq: Two times a day (BID) | ORAL | 1 refills | Status: DC

## 2023-07-04 DIAGNOSIS — H401132 Primary open-angle glaucoma, bilateral, moderate stage: Secondary | ICD-10-CM | POA: Diagnosis not present

## 2023-07-04 DIAGNOSIS — H53433 Sector or arcuate defects, bilateral: Secondary | ICD-10-CM | POA: Diagnosis not present

## 2023-07-04 DIAGNOSIS — H2513 Age-related nuclear cataract, bilateral: Secondary | ICD-10-CM | POA: Diagnosis not present

## 2023-07-04 DIAGNOSIS — E119 Type 2 diabetes mellitus without complications: Secondary | ICD-10-CM | POA: Diagnosis not present

## 2023-07-04 LAB — HM DIABETES EYE EXAM

## 2023-07-15 DIAGNOSIS — E2749 Other adrenocortical insufficiency: Secondary | ICD-10-CM | POA: Diagnosis not present

## 2023-07-15 DIAGNOSIS — E11649 Type 2 diabetes mellitus with hypoglycemia without coma: Secondary | ICD-10-CM | POA: Diagnosis not present

## 2023-07-15 DIAGNOSIS — E230 Hypopituitarism: Secondary | ICD-10-CM | POA: Diagnosis not present

## 2023-07-15 DIAGNOSIS — E669 Obesity, unspecified: Secondary | ICD-10-CM | POA: Diagnosis not present

## 2023-07-15 DIAGNOSIS — Z794 Long term (current) use of insulin: Secondary | ICD-10-CM | POA: Diagnosis not present

## 2023-07-15 DIAGNOSIS — E1169 Type 2 diabetes mellitus with other specified complication: Secondary | ICD-10-CM | POA: Diagnosis not present

## 2023-07-15 DIAGNOSIS — E221 Hyperprolactinemia: Secondary | ICD-10-CM | POA: Diagnosis not present

## 2023-07-15 DIAGNOSIS — E1165 Type 2 diabetes mellitus with hyperglycemia: Secondary | ICD-10-CM | POA: Diagnosis not present

## 2023-07-15 DIAGNOSIS — E038 Other specified hypothyroidism: Secondary | ICD-10-CM | POA: Diagnosis not present

## 2023-07-25 DIAGNOSIS — N1832 Chronic kidney disease, stage 3b: Secondary | ICD-10-CM | POA: Diagnosis not present

## 2023-07-25 DIAGNOSIS — I129 Hypertensive chronic kidney disease with stage 1 through stage 4 chronic kidney disease, or unspecified chronic kidney disease: Secondary | ICD-10-CM | POA: Diagnosis not present

## 2023-07-25 DIAGNOSIS — N390 Urinary tract infection, site not specified: Secondary | ICD-10-CM | POA: Diagnosis not present

## 2023-07-25 DIAGNOSIS — N2581 Secondary hyperparathyroidism of renal origin: Secondary | ICD-10-CM | POA: Diagnosis not present

## 2023-08-03 DIAGNOSIS — I129 Hypertensive chronic kidney disease with stage 1 through stage 4 chronic kidney disease, or unspecified chronic kidney disease: Secondary | ICD-10-CM | POA: Diagnosis not present

## 2023-08-03 DIAGNOSIS — N1832 Chronic kidney disease, stage 3b: Secondary | ICD-10-CM | POA: Diagnosis not present

## 2023-08-03 DIAGNOSIS — N2581 Secondary hyperparathyroidism of renal origin: Secondary | ICD-10-CM | POA: Diagnosis not present

## 2023-08-03 DIAGNOSIS — N189 Chronic kidney disease, unspecified: Secondary | ICD-10-CM | POA: Diagnosis not present

## 2023-08-03 DIAGNOSIS — N390 Urinary tract infection, site not specified: Secondary | ICD-10-CM | POA: Diagnosis not present

## 2023-08-03 DIAGNOSIS — D631 Anemia in chronic kidney disease: Secondary | ICD-10-CM | POA: Diagnosis not present

## 2023-08-10 DIAGNOSIS — E291 Testicular hypofunction: Secondary | ICD-10-CM | POA: Diagnosis not present

## 2023-08-10 DIAGNOSIS — R972 Elevated prostate specific antigen [PSA]: Secondary | ICD-10-CM | POA: Diagnosis not present

## 2023-08-17 DIAGNOSIS — R351 Nocturia: Secondary | ICD-10-CM | POA: Diagnosis not present

## 2023-08-17 DIAGNOSIS — N401 Enlarged prostate with lower urinary tract symptoms: Secondary | ICD-10-CM | POA: Diagnosis not present

## 2023-08-17 DIAGNOSIS — E291 Testicular hypofunction: Secondary | ICD-10-CM | POA: Diagnosis not present

## 2023-08-17 DIAGNOSIS — R972 Elevated prostate specific antigen [PSA]: Secondary | ICD-10-CM | POA: Diagnosis not present

## 2023-08-25 ENCOUNTER — Encounter: Payer: Self-pay | Admitting: Family Medicine

## 2023-08-25 ENCOUNTER — Ambulatory Visit (INDEPENDENT_AMBULATORY_CARE_PROVIDER_SITE_OTHER): Payer: Medicare Other | Admitting: Family Medicine

## 2023-08-25 DIAGNOSIS — Z0000 Encounter for general adult medical examination without abnormal findings: Secondary | ICD-10-CM

## 2023-08-25 NOTE — Patient Instructions (Signed)
 I really enjoyed getting to talk with you today! I am available on Tuesdays and Thursdays for virtual visits if you have any questions or concerns, or if I can be of any further assistance.   CHECKLIST FROM ANNUAL WELLNESS VISIT:  -Follow up (please call to schedule if not scheduled after visit):   -yearly for annual wellness visit with primary care office  Here is a list of your preventive care/health maintenance measures and the plan for each if any are due:  PLAN For any measures below that may be due:   Health Maintenance  Topic Date Due   Diabetic kidney evaluation - Urine ACR  Never done   Hepatitis C Screening  Never done   Diabetic kidney evaluation - eGFR measurement  06/07/2023   Zoster Vaccines- Shingrix (2 of 2) 08/10/2023   HEMOGLOBIN A1C  11/30/2023   INFLUENZA VACCINE  12/09/2023   COVID-19 Vaccine (6 - 2024-25 season) 12/13/2023   OPHTHALMOLOGY EXAM  12/14/2023   FOOT EXAM  06/01/2024   Medicare Annual Wellness (AWV)  08/24/2024   Colonoscopy  10/14/2030   DTaP/Tdap/Td (2 - Td or Tdap) 06/14/2033   Pneumonia Vaccine 55+ Years old  Completed   HPV VACCINES  Aged Out   Meningococcal B Vaccine  Aged Out    -See a dentist at least yearly  -Get your eyes checked and then per your eye specialist's recommendations  -Other issues addressed today:   -I have included below further information regarding a healthy whole foods based diet, physical activity guidelines for adults, stress management and opportunities for social connections. I hope you find this information useful.   -----------------------------------------------------------------------------------------------------------------------------------------------------------------------------------------------------------------------------------------------------------    NUTRITION: -eat real food: lots of colorful vegetables (half the plate) and fruits -5-7 servings of vegetables and fruits per day (fresh or  steamed is best), exp. 2 servings of vegetables with lunch and dinner and 2 servings of fruit per day. Berries and greens such as kale and collards are great choices.  -consume on a regular basis:  fresh fruits, fresh veggies, fish, nuts, seeds, healthy oils (such as olive oil, avocado oil), whole grains (make sure for bread/pasta/crackers/etc., that the first ingredient on label contains the word "whole"), legumes. -can eat small amounts of dairy and lean meat (no larger than the palm of your hand), but avoid processed meats such as ham, bacon, lunch meat, etc. -drink water -try to avoid fast food and pre-packaged foods, processed meat, ultra processed foods/beverages (donuts, candy, etc.) -most experts advise limiting sodium to < 2300mg  per day, should limit further is any chronic conditions such as high blood pressure, heart disease, diabetes, etc. The American Heart Association advised that < 1500mg  is is ideal -try to avoid foods/beverages that contain any ingredients with names you do not recognize  -try to avoid foods/beverages  with added sugar or sweeteners/sweets  -try to avoid sweet drinks (including diet drinks): soda, juice, Gatorade, sweet tea, power drinks, diet drinks -try to avoid white rice, white bread, pasta (unless whole grain)  EXERCISE GUIDELINES FOR ADULTS: -if you wish to increase your physical activity, do so gradually and with the approval of your doctor -STOP and seek medical care immediately if you have any chest pain, chest discomfort or trouble breathing when starting or increasing exercise  -move and stretch your body, legs, feet and arms when sitting for long periods -Physical activity guidelines for optimal health in adults: -get at least 150 minutes per week of moderate exercise (can talk, but not sing); this  is about 20-30 minutes of sustained activity 5-7 days per week or two 10-15 minute episodes of sustained activity 5-7 days per week -do some muscle  building/resistance training/strength training at least 2 days per week  -balance exercises 3+ days per week:   Stand somewhere where you have something sturdy to hold onto if you lose balance    1) lift up on toes, then back down, start with 5x per day and work up to 20x   2) stand and lift one leg straight out to the side so that foot is a few inches of the floor, start with 5x each side and work up to 20x each side   3) stand on one foot, start with 5 seconds each side and work up to 20 seconds on each side  If you need ideas or help with getting more active:  -Silver sneakers https://tools.silversneakers.com  -Walk with a Doc: http://www.duncan-williams.com/  -try to include resistance (weight lifting/strength building) and balance exercises twice per week: or the following link for ideas: http://castillo-powell.com/  BuyDucts.dk  STRESS MANAGEMENT: -can try meditating, or just sitting quietly with deep breathing while intentionally relaxing all parts of your body for 5 minutes daily -if you need further help with stress, anxiety or depression please follow up with your primary doctor or contact the wonderful folks at [facility]: [phone]  SOCIAL CONNECTIONS: -options in [place] if you wish to engage in more social and exercise related activities:  -Silver sneakers https://tools.silversneakers.com  -Walk with a Doc: http://www.duncan-williams.com/  -Check out the [facility] Active Adults 50+ section on the [place] [employer] (hiking clubs, book clubs, cards and games, chess, exercise classes, aquatic classes and much more) - see the website for details: https://www.[place].gov/departments/parks-recreation/active-adults50  -YouTube has lots of exercise videos for different ages and abilities as well  -[facility] (a variety of indoor and outdoor  inperson activities for adults). [phone]. [address]  -Virtual Online Classes (a variety of topics): see seniorplanet.org or call (570)375-6675  -consider volunteering at a school, hospice center, church, senior center or elsewhere

## 2023-08-25 NOTE — Progress Notes (Signed)
 Patient unable to obtain vital signs due to telehealth visit

## 2023-08-25 NOTE — Progress Notes (Signed)
 PATIENT CHECK-IN and HEALTH RISK ASSESSMENT QUESTIONNAIRE:  -completed by phone/video for upcoming Medicare Preventive Visit   Pre-Visit Check-in: 1)Vitals (height, wt, BP, etc) - record in vitals section for visit on day of visit Request home vitals (wt, BP, etc.) and enter into vitals, THEN update Vital Signs SmartPhrase below at the top of the HPI. See below.  2)Review and Update Medications, Allergies PMH, Surgeries, Social history in Epic 3)Hospitalizations in the last year with date/reason? No  4)Review and Update Care Team (patient's specialists) in Epic 5) Complete PHQ9 in Epic  6) Complete Fall Screening in Epic 7)Review all Health Maintenance Due and order under PCP if not done.  Medicare Wellness Patient Questionnaire:  Answer theses question about your habits: How often do you have a drink containing alcohol?N/A Have you ever smoked?No  Quit date if applicable? N/A  How many packs a day do/did you smoke? N/A Do you use smokeless tobacco?No Do you use an illicit drugs?No On average, how many days per week do you engage in moderate to strenuous exercise (like a brisk walk)?3 days - goes to [employer] does treadmill and some strengthening On average, how many minutes do you engage in exercise at this level?40 mins  Typical breakfast: Varies - sometimes oatmeal and a muffin, sometimes milk or juice Typical lunch and dinner: chicken and veggies Typical snacks: Varies - mango strips, nuts  Beverages: Water   Answer theses question about your everyday activities: Can you perform most household chores? Yes  Are you deaf or have significant trouble hearing? No Do you feel that you have a problem with memory? No Do you feel safe at home? Yes  Last dentist visit? 1 year  8. Do you have any difficulty performing your everyday activities?No Are you having any difficulty walking, taking medications on your own, and or difficulty managing daily home needs?No Do you have  difficulty walking or climbing stairs?No Do you have difficulty dressing or bathing?No Do you have difficulty doing errands alone such as visiting a doctor's office or shopping?No Do you currently have any difficulty preparing food and eating?No Do you currently have any difficulty using the toilet?No Do you have any difficulty managing your finances?No Do you have any difficulties with housekeeping of managing your housekeeping?No   Do you have Advanced Directives in place (Living Will, Healthcare Power or Attorney)?  Yes    Last eye Exam and location? 4 months ago, Dr. Brooks Cao, goes every 4 months   Do you currently use prescribed or non-prescribed narcotic or opioid pain medications? No  Do you have a history or close family history of breast, ovarian, tubal or peritoneal cancer or a family member with BRCA (breast cancer susceptibility 1 and 2) gene mutations? No  Nurse/Assistant Credentials/time stamp: Mg 4:29 Pm    ----------------------------------------------------------------------------------------------------------------------------------------------------------------------------------------------------------------------  Because this visit was a virtual/telehealth visit, some criteria may be missing or patient reported. Any vitals not documented were not able to be obtained and vitals that have been documented are patient reported.    MEDICARE ANNUAL PREVENTIVE CARE VISIT WITH PROVIDER (Welcome to Medicare, initial annual wellness or annual wellness exam)  Virtual Visit via Video Note  I connected with Trevor Kelley on 08/25/23  by a video enabled telemedicine application and verified that I am speaking with the correct person using two identifiers.  Location patient: home Location provider:work or home office Persons participating in the virtual visit: patient, provider, patient's wife  Concerns and/or follow up today: none   See  HM section in Epic for other  details of completed HM.    ROS: negative for report of fevers, unintentional weight loss, vision changes, vision loss, hearing loss or change, chest pain, sob, hemoptysis, melena, hematochezia, hematuria, falls, bleeding or bruising, thoughts of suicide or self harm, memory loss  Patient-completed extensive health risk assessment - reviewed and discussed with the patient: See Health Risk Assessment completed with patient prior to the visit either above or in recent phone note. This was reviewed in detailed with the patient today and appropriate recommendations, orders and referrals were placed as needed per Summary below and patient instructions.   Review of Medical History: -PMH, PSH, Family History and current specialty and care providers reviewed and updated and listed below   Patient Care Team: Philip Aspen, Limmie Patricia, MD as PCP - General (Internal Medicine)   Past Medical History:  Diagnosis Date   CKD (chronic kidney disease)    Diabetes mellitus without complication (HCC)    DM (diabetes mellitus), type 2 (HCC)    History of cerebral meningioma    History of pituitary adenoma    Hypertension    Hypothyroidism    OSA (obstructive sleep apnea)    Panhypopituitarism (HCC)    Seizure disorder (HCC) 05/31/2017    Past Surgical History:  Procedure Laterality Date   BRAIN SURGERY      Social History   Socioeconomic History   Marital status: Married    Spouse name: Gavin Pound   Number of children: 2   Years of education: 12   Highest education level: Not on file  Occupational History   Occupation: Retired  Tobacco Use   Smoking status: Never   Smokeless tobacco: Never  Vaping Use   Vaping status: Never Used  Substance and Sexual Activity   Alcohol use: Never    Comment: occassional   Drug use: No   Sexual activity: Never  Other Topics Concern   Not on file  Social History Narrative   Lives with wife   Married, 2 children   Right handed   12 th grade    Caffeine use: 1 per month or less   Social Drivers of Corporate investment banker Strain: Low Risk  (08/25/2023)   Overall Financial Resource Strain (CARDIA)    Difficulty of Paying Living Expenses: Not hard at all  Food Insecurity: No Food Insecurity (08/25/2023)   Hunger Vital Sign    Worried About Running Out of Food in the Last Year: Never true    Ran Out of Food in the Last Year: Never true  Transportation Needs: No Transportation Needs (08/25/2023)   PRAPARE - Administrator, Civil Service (Medical): No    Lack of Transportation (Non-Medical): No  Physical Activity: Insufficiently Active (08/25/2023)   Exercise Vital Sign    Days of Exercise per Week: 3 days    Minutes of Exercise per Session: 40 min  Stress: No Stress Concern Present (08/25/2023)   [employer] of Occupational Health - Occupational Stress Questionnaire    Feeling of Stress : Not at all  Social Connections: Moderately Integrated (08/25/2023)   Social Connection and Isolation Panel [NHANES]    Frequency of Communication with Friends and Family: Three times a week    Frequency of Social Gatherings with Friends and Family: Once a week    Attends Religious Services: More than 4 times per year    Active Member of [employer] or Organizations: No    Attends Banker  Meetings: Never    Marital Status: Married  Catering manager Violence: Not At Risk (08/25/2023)   Humiliation, Afraid, Rape, and Kick questionnaire    Fear of Current or Ex-Partner: No    Emotionally Abused: No    Physically Abused: No    Sexually Abused: No    Family History  Problem Relation Age of Onset   Cancer - Colon Mother    Hypothyroidism Other    Diabetes Mellitus II Other    Cancer Other    Hypertension Other     Current Outpatient Medications on File Prior to Visit  Medication Sig Dispense Refill   amLODipine (NORVASC) 10 MG tablet Take 10 mg by mouth daily.     calcitRIOL (ROCALTROL) 0.25 MCG capsule Take 0.25  mcg by mouth daily.     Finerenone (KERENDIA) 10 MG TABS Take by mouth.     hydrocortisone (CORTEF) 10 MG tablet Take 10-15 mg by mouth See admin instructions. Take 15mg  by mouth every morning and take 10mg  by mouth tablet in the evening.     insulin degludec (TRESIBA) 100 UNIT/ML FlexTouch Pen Inject 12 Units into the skin daily. (Patient taking differently: Inject 8 Units into the skin daily.) 3 mL 0   latanoprost (XALATAN) 0.005 % ophthalmic solution Place 1 drop into both eyes 2 (two) times daily.     levETIRAcetam (KEPPRA) 500 MG tablet Take 1 tablet (500 mg total) by mouth 2 (two) times daily. 180 tablet 1   levothyroxine (SYNTHROID, LEVOTHROID) 88 MCG tablet Take 88 mcg by mouth daily before breakfast.     olmesartan-hydrochlorothiazide (BENICAR HCT) 20-12.5 MG tablet Take 1 tablet by mouth daily.     simvastatin (ZOCOR) 20 MG tablet Take 20 mg by mouth daily at 6 PM.      tamsulosin (FLOMAX) 0.4 MG CAPS capsule Take 0.4 mg by mouth daily.      TESTOSTERONE COMPOUNDING KIT TD Place 1 mL onto the skin every morning.     timolol (TIMOPTIC) 0.5 % ophthalmic solution Place 1 drop into both eyes daily.      VITAMIN D PO Take 1 tablet by mouth daily.     No current facility-administered medications on file prior to visit.    No Known Allergies     Physical Exam Vitals requested from patient and listed below if patient had equipment and was able to obtain at home for this virtual visit: There were no vitals filed for this visit. Estimated body mass index is 31.68 kg/m as calculated from the following:   Height as of 06/02/23: 6' (1.829 m).   Weight as of 06/02/23: 233 lb 9.6 oz (106 kg).  EKG (optional): deferred due to virtual visit  GENERAL: alert, oriented, no acute distress detected; full vision exam deferred due to pandemic and/or virtual encounter   HEENT: atraumatic, conjunttiva clear, no obvious abnormalities on inspection of external nose and ears  NECK: normal movements of  the head and neck  LUNGS: on inspection no signs of respiratory distress, breathing rate appears normal, no obvious gross SOB, gasping or wheezing  CV: no obvious cyanosis  MS: moves all visible extremities without noticeable abnormality  PSYCH/NEURO: pleasant and cooperative, no obvious depression or anxiety, speech and thought processing grossly intact, Cognitive function grossly intact  Flowsheet Row Clinical Support from 08/25/2023 in [facility] [place]  PHQ-9 Total Score 1           08/25/2023    4:20 PM 06/02/2023  1:19 PM  Depression screen PHQ 2/9  Decreased Interest 0 0  Down, Depressed, Hopeless 0 0  PHQ - 2 Score 0 0  Altered sleeping 0 0  Tired, decreased energy 1 1  Change in appetite 0 1  Feeling bad or failure about yourself  0 0  Trouble concentrating 0 0  Moving slowly or fidgety/restless 0 0  Suicidal thoughts 0 0  PHQ-9 Score 1 2  Difficult doing work/chores Not difficult at all Not difficult at all       09/25/2021    8:22 AM 09/25/2021    9:51 PM 09/26/2021    8:13 AM 06/02/2023    1:19 PM 08/25/2023    4:21 PM  Fall Risk  Falls in the past year?    0 0  Was there an injury with Fall?    0 0  Fall Risk Category Calculator    0 0  (RETIRED) Patient Fall Risk Level Moderate fall risk Moderate fall risk Moderate fall risk    Patient at Risk for Falls Due to     No Fall Risks  Fall risk Follow up    Falls evaluation completed Falls evaluation completed     SUMMARY AND PLAN:  Encounter for Medicare annual wellness exam  Discussed applicable health maintenance/preventive health measures and advised and referred or ordered per patient preferences: -updated his eye exam - found report in chart -updated vaccine, patient's wife pulled record from pharmacy -reports he is already scheduled for the 2nd dose of the singles vaccine -reports already had labs with his kidney specialist recently  Health Maintenance  Topic Date Due    Diabetic kidney evaluation - Urine ACR  Never done   Hepatitis C Screening  Never done   Diabetic kidney evaluation - eGFR measurement  06/07/2023   Zoster Vaccines- Shingrix (2 of 2) 08/10/2023   HEMOGLOBIN A1C  11/30/2023   INFLUENZA VACCINE  12/09/2023   COVID-19 Vaccine (6 - 2024-25 season) 12/13/2023   OPHTHALMOLOGY EXAM  12/14/2023   FOOT EXAM  06/01/2024   Medicare Annual Wellness (AWV)  08/24/2024   Colonoscopy  10/14/2030   DTaP/Tdap/Td (2 - Td or Tdap) 06/14/2033   Pneumonia Vaccine 2+ Years old  Completed   HPV VACCINES  Aged Out   Meningococcal B Vaccine  Aged Out     Education and counseling on the following was provided based on the above review of health and a plan/checklist for the patient, along with additional information discussed, was provided for the patient in the patient instructions :   -Advised and counseled on a healthy lifestyle - including the importance of a healthy diet, regular physical activity, social connections and stress management. -Reviewed patient's current diet. Advised and counseled on a whole foods based healthy diet. A summary of a healthy diet was provided in the Patient Instructions.  -reviewed patient's current physical activity level and discussed exercise guidelines for adults. Discussed community resources and ideas for safe exercise at home to assist in meeting exercise guideline recommendations in a safe and healthy way.  -Advise yearly dental visits at minimum and regular eye exams   Follow up: see patient instructions   Patient Instructions  I really enjoyed getting to talk with you today! I am available on Tuesdays and Thursdays for virtual visits if you have any questions or concerns, or if I can be of any further assistance.   CHECKLIST FROM ANNUAL WELLNESS VISIT:  -Follow up (please call to schedule if not scheduled  after visit):   -yearly for annual wellness visit with primary care office  Here is a list of your  preventive care/health maintenance measures and the plan for each if any are due:  PLAN For any measures below that may be due:   Health Maintenance  Topic Date Due   Diabetic kidney evaluation - Urine ACR  Never done   Hepatitis C Screening  Never done   Diabetic kidney evaluation - eGFR measurement  06/07/2023   Zoster Vaccines- Shingrix (2 of 2) 08/10/2023   HEMOGLOBIN A1C  11/30/2023   INFLUENZA VACCINE  12/09/2023   COVID-19 Vaccine (6 - 2024-25 season) 12/13/2023   OPHTHALMOLOGY EXAM  12/14/2023   FOOT EXAM  06/01/2024   Medicare Annual Wellness (AWV)  08/24/2024   Colonoscopy  10/14/2030   DTaP/Tdap/Td (2 - Td or Tdap) 06/14/2033   Pneumonia Vaccine 89+ Years old  Completed   HPV VACCINES  Aged Out   Meningococcal B Vaccine  Aged Out    -See a dentist at least yearly  -Get your eyes checked and then per your eye specialist's recommendations  -Other issues addressed today:   -I have included below further information regarding a healthy whole foods based diet, physical activity guidelines for adults, stress management and opportunities for social connections. I hope you find this information useful.   -----------------------------------------------------------------------------------------------------------------------------------------------------------------------------------------------------------------------------------------------------------    NUTRITION: -eat real food: lots of colorful vegetables (half the plate) and fruits -5-7 servings of vegetables and fruits per day (fresh or steamed is best), exp. 2 servings of vegetables with lunch and dinner and 2 servings of fruit per day. Berries and greens such as kale and collards are great choices.  -consume on a regular basis:  fresh fruits, fresh veggies, fish, nuts, seeds, healthy oils (such as olive oil, avocado oil), whole grains (make sure for bread/pasta/crackers/etc., that the first ingredient on label  contains the word "whole"), legumes. -can eat small amounts of dairy and lean meat (no larger than the palm of your hand), but avoid processed meats such as ham, bacon, lunch meat, etc. -drink water -try to avoid fast food and pre-packaged foods, processed meat, ultra processed foods/beverages (donuts, candy, etc.) -most experts advise limiting sodium to < 2300mg  per day, should limit further is any chronic conditions such as high blood pressure, heart disease, diabetes, etc. The American Heart Association advised that < 1500mg  is is ideal -try to avoid foods/beverages that contain any ingredients with names you do not recognize  -try to avoid foods/beverages  with added sugar or sweeteners/sweets  -try to avoid sweet drinks (including diet drinks): soda, juice, Gatorade, sweet tea, power drinks, diet drinks -try to avoid white rice, white bread, pasta (unless whole grain)  EXERCISE GUIDELINES FOR ADULTS: -if you wish to increase your physical activity, do so gradually and with the approval of your doctor -STOP and seek medical care immediately if you have any chest pain, chest discomfort or trouble breathing when starting or increasing exercise  -move and stretch your body, legs, feet and arms when sitting for long periods -Physical activity guidelines for optimal health in adults: -get at least 150 minutes per week of moderate exercise (can talk, but not sing); this is about 20-30 minutes of sustained activity 5-7 days per week or two 10-15 minute episodes of sustained activity 5-7 days per week -do some muscle building/resistance training/strength training at least 2 days per week  -balance exercises 3+ days per week:   Stand somewhere where you have something sturdy  to hold onto if you lose balance    1) lift up on toes, then back down, start with 5x per day and work up to 20x   2) stand and lift one leg straight out to the side so that foot is a few inches of the floor, start with 5x each  side and work up to 20x each side   3) stand on one foot, start with 5 seconds each side and work up to 20 seconds on each side  If you need ideas or help with getting more active:  -Silver sneakers https://tools.silversneakers.com  -Walk with a Doc: http://www.duncan-williams.com/  -try to include resistance (weight lifting/strength building) and balance exercises twice per week: or the following link for ideas: http://castillo-powell.com/  BuyDucts.dk  STRESS MANAGEMENT: -can try meditating, or just sitting quietly with deep breathing while intentionally relaxing all parts of your body for 5 minutes daily -if you need further help with stress, anxiety or depression please follow up with your primary doctor or contact the wonderful folks at [facility]: [phone]  SOCIAL CONNECTIONS: -options in [place] if you wish to engage in more social and exercise related activities:  -Silver sneakers https://tools.silversneakers.com  -Walk with a Doc: http://www.duncan-williams.com/  -Check out the [facility] Active Adults 50+ section on the [place] of [employer] (hiking clubs, book clubs, cards and games, chess, exercise classes, aquatic classes and much more) - see the website for details: https://www.[place].gov/departments/parks-recreation/active-adults50  -YouTube has lots of exercise videos for different ages and abilities as well  -[facility] (a variety of indoor and outdoor inperson activities for adults). 660-310-6014. [address]  -Virtual Online Classes (a variety of topics): see seniorplanet.org or call 313-147-9556  -consider volunteering at a school, hospice center, church, senior center or elsewhere            Maurie Southern, DO

## 2023-09-01 ENCOUNTER — Encounter: Payer: Medicare Other | Admitting: Internal Medicine

## 2023-09-06 ENCOUNTER — Ambulatory Visit (INDEPENDENT_AMBULATORY_CARE_PROVIDER_SITE_OTHER): Payer: Medicare Other | Admitting: Internal Medicine

## 2023-09-06 ENCOUNTER — Encounter: Payer: Self-pay | Admitting: Internal Medicine

## 2023-09-06 VITALS — BP 110/84 | HR 94 | Temp 98.4°F | Ht 71.5 in | Wt 240.2 lb

## 2023-09-06 DIAGNOSIS — G4733 Obstructive sleep apnea (adult) (pediatric): Secondary | ICD-10-CM | POA: Diagnosis not present

## 2023-09-06 DIAGNOSIS — E108 Type 1 diabetes mellitus with unspecified complications: Secondary | ICD-10-CM

## 2023-09-06 DIAGNOSIS — D352 Benign neoplasm of pituitary gland: Secondary | ICD-10-CM

## 2023-09-06 DIAGNOSIS — E038 Other specified hypothyroidism: Secondary | ICD-10-CM | POA: Diagnosis not present

## 2023-09-06 DIAGNOSIS — E291 Testicular hypofunction: Secondary | ICD-10-CM | POA: Diagnosis not present

## 2023-09-06 DIAGNOSIS — G40909 Epilepsy, unspecified, not intractable, without status epilepticus: Secondary | ICD-10-CM

## 2023-09-06 DIAGNOSIS — I10 Essential (primary) hypertension: Secondary | ICD-10-CM

## 2023-09-06 DIAGNOSIS — N1832 Chronic kidney disease, stage 3b: Secondary | ICD-10-CM | POA: Diagnosis not present

## 2023-09-06 DIAGNOSIS — E230 Hypopituitarism: Secondary | ICD-10-CM

## 2023-09-06 NOTE — Progress Notes (Signed)
 Established Patient Office Visit     CC/Reason for Visit: Follow-up chronic conditions  HPI: Trevor Kelley is a 73 y.o. male who is coming in today for the above mentioned reasons. 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  He is feeling well.  Unfortunately his recent MRI showed growth of his adenoma and he is scheduled for gamma knife radiation in June.   Past Medical/Surgical History: Past Medical History:  Diagnosis Date   CKD (chronic kidney disease)    Diabetes mellitus without complication (HCC)    DM (diabetes mellitus), type 2 (HCC)    History of cerebral meningioma    History of pituitary adenoma    Hypertension    Hypothyroidism    OSA (obstructive sleep apnea)    Panhypopituitarism (HCC)    Seizure disorder (HCC) 05/31/2017    Past Surgical History:  Procedure Laterality Date   BRAIN SURGERY      Social History:  reports that he has never smoked. He has never used smokeless tobacco. He reports that he does not drink alcohol and does not use drugs.  Allergies: No Known Allergies  Family History:  Family History  Problem Relation Age of Onset   Cancer - Colon Mother    Hypothyroidism Other    Diabetes Mellitus II Other    Cancer Other    Hypertension Other      Current Outpatient Medications:    amLODipine  (NORVASC ) 10 MG tablet, Take 10 mg by mouth daily., Disp: , Rfl:    calcitRIOL  (ROCALTROL ) 0.25 MCG capsule, Take 0.25 mcg by mouth daily., Disp: , Rfl:    Finerenone (KERENDIA) 10 MG TABS, Take by mouth., Disp: , Rfl:    hydrocortisone  (CORTEF ) 10 MG tablet, Take 10-15 mg by mouth See admin instructions. Take 15mg  by mouth every morning and take 10mg  by mouth tablet in the evening., Disp: , Rfl:    insulin  degludec (TRESIBA ) 100 UNIT/ML FlexTouch Pen,  Inject 12 Units into the skin daily. (Patient taking differently: Inject 8 Units into the skin daily.), Disp: 3 mL, Rfl: 0   latanoprost  (XALATAN ) 0.005 % ophthalmic solution, Place 1 drop into both eyes 2 (two) times daily., Disp: , Rfl:    levETIRAcetam  (KEPPRA ) 500 MG tablet, Take 1 tablet (500 mg total) by mouth 2 (two) times daily., Disp: 180 tablet, Rfl: 1   levothyroxine  (SYNTHROID , LEVOTHROID) 88 MCG tablet, Take 88 mcg by mouth daily before breakfast., Disp: , Rfl:    olmesartan-hydrochlorothiazide (BENICAR HCT) 20-12.5 MG tablet, Take 1 tablet by mouth daily., Disp: , Rfl:    simvastatin  (ZOCOR ) 20 MG tablet, Take 20 mg by mouth daily at 6 PM. , Disp: , Rfl:    tamsulosin  (FLOMAX ) 0.4 MG CAPS capsule, Take 0.4 mg by mouth daily. , Disp: , Rfl:    TESTOSTERONE  COMPOUNDING KIT TD, Place 1 mL onto the skin every morning., Disp: , Rfl:    timolol  (TIMOPTIC ) 0.5 % ophthalmic solution, Place 1 drop into both eyes daily. , Disp: , Rfl:    VITAMIN D  PO, Take 1 tablet by mouth daily., Disp: , Rfl:   Review of Systems:  Negative unless indicated in HPI.   Physical Exam: Vitals:   09/06/23 0903  BP: 110/84  Pulse: 94  Temp: 98.4 F (36.9 C)  TempSrc: Oral  SpO2: 98%  Weight: 240 lb  3.2 oz (109 kg)  Height: 5' 11.5" (1.816 m)    Body mass index is 33.03 kg/m.   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 - Has not had any breakthrough seizures on Keppra . - His chronic kidney disease is at baseline, he was started on Kerendia in January with slight improvement in renal function. - A1c was at goal, followed by endocrinology. - He remains on testosterone  gel  for his hypogonadism.  Time spent:31 minutes reviewing chart, interviewing and examining patient and formulating plan of care.     Marguerita Shih, MD [facility]

## 2023-09-12 ENCOUNTER — Encounter: Payer: Self-pay | Admitting: Internal Medicine

## 2023-10-04 DIAGNOSIS — D352 Benign neoplasm of pituitary gland: Secondary | ICD-10-CM | POA: Diagnosis not present

## 2023-10-04 DIAGNOSIS — Z510 Encounter for antineoplastic radiation therapy: Secondary | ICD-10-CM | POA: Diagnosis not present

## 2023-10-04 DIAGNOSIS — D329 Benign neoplasm of meninges, unspecified: Secondary | ICD-10-CM | POA: Diagnosis not present

## 2023-10-05 ENCOUNTER — Ambulatory Visit (INDEPENDENT_AMBULATORY_CARE_PROVIDER_SITE_OTHER): Payer: Medicare Other | Admitting: Neurology

## 2023-10-05 ENCOUNTER — Encounter: Payer: Self-pay | Admitting: Neurology

## 2023-10-05 VITALS — BP 126/88 | Resp 15 | Ht 72.0 in | Wt 246.0 lb

## 2023-10-05 DIAGNOSIS — G4733 Obstructive sleep apnea (adult) (pediatric): Secondary | ICD-10-CM | POA: Diagnosis not present

## 2023-10-05 DIAGNOSIS — G40909 Epilepsy, unspecified, not intractable, without status epilepticus: Secondary | ICD-10-CM

## 2023-10-05 MED ORDER — LEVETIRACETAM 500 MG PO TABS: 500.0000 mg | ORAL_TABLET | Freq: Two times a day (BID) | ORAL | 4 refills | Status: AC

## 2023-10-05 NOTE — Progress Notes (Signed)
 Patient: Trevor Kelley Date of Birth: 03-12-1951  Reason for Visit: Follow up  History from: Patient Primary Neurologist: Dr. Albertina Hugger   ASSESSMENT AND PLAN 74 y.o. year old male   Sleep apnea on BiPAP (setup Feb 2023) -Has superb compliance, encouraged to continue nightly use for minimum 4 hours.  His AHI has crept up slightly at 5.0, he has a slight mask leak at 24.  However, he continues have excellent benefit from CPAP.  Will continue to monitor.  If he is noticing that his mask is leaking, he will let me know and I can order a mask refit.   2.  History of meningioma 2015, status postresection 3.  History of seizures, right frontotemporal encephalomalacia 4.  Pituitary adenoma -Follows with Dr. Melda Spore, annual MRI has shown slight growth of pituitary adenoma, he will be undergoing gamma knife tomorrow -Single seizure in 2019, no recurrent events since  -Continue Keppra  500 mg twice a day, -Follow-up in 1 year or sooner if needed  HISTORY OF PRESENT ILLNESS: Today 10/05/23 Review of BiPAP compliance shows 100% usage, AHI 5.0, leak 24.1.  14 IPAP, 9 EPAP. BIPAP doing well. Using FFM. He can't go to sleep without his BIPAP. No seizures, still on Keppra . Dr. Melda Spore is following him for pituitary adenoma, going to have gamma knife tomorrow. Adenoma has been growing slightly. Otherwise doing very well, feels great, continues going to the gym.   10/05/22 SS: Here today for follow-up. Doing great. No seizures, remains on Keppra , needs a refill. BIPAP is doing great, no issues, wears full face mask. Feels good energy during the day, good quality sleep. Has been placed on testosterone  gel, has helped energy.  Health is good, wearing continuous glucose monitor, going to the gym 3 days a week.   Review of CPAP data 30/30 days 100%, greater than 4 hours 100%.  Average usage 7 hours 32 minutes.  Max IPAP 14, Min EPAP 9.  Pressure support 4 cm water.  Leak 11.5, AHI 3.0.  ESS 4.  09/28/21 SS:  Trevor Kelley is here today for initial bipap sleep consult. HST 08/12/21 showed severe sleep apnea with atypical distribution between REM and non-REM sleep.  More typical for central sleep apnea.  No significant oxygen desaturation.  He got a new BiPAP machine.  He is enjoying it.  Brief interval history, in the past was on CPAP, then switched to BiPAP.  Just discharged from hospitalization for gout flare.  Feeling much better.  Review of BiPAP data attached in note, shows overall good compliance, he did miss a few days when he was suffering from a gout flare.  AHI is 3.1.  He uses full facemask, leak in the 95th percentile was 20.3, looking at the bar graph, most of the leak was associated on the days he cannot get comfortable from pain.  In regards to his seizures, he had meningioma resection in 2015, had a single seizure in 2019, has remained on Keppra  since.  He has not had any further events. Denies headache or other neuro symptom. He lives with his wife, drives a car.  ESS was 6.  He is quite pleasant, feels much better with BiPAP, wishes to continue.  HISTORY  Copied Dr. Albertina Hugger 06/23/2021: Trevor Kelley is a 73 y.o. year old Black or [place] American male patient seen here as a referral on 06/23/2021 from Dr Tilda Fogo for a transfer of CPAP care.   Trevor Kelley  has a past medical history  of CKD (chronic kidney disease), Diabetes mellitus without complication (HCC), History of pituitary adenoma, Hypertension, Hypothyroidism, OSA (obstructive sleep apnea), and Seizure disorder (HCC) (05/31/2017).   Chief concern according to patient :  Referral from Dr. Tilda Fogo for OSA/on CPAP. He followed the patient for seizure disorder/never followed this patient for OSA or CPAP.  The patient had a pituitary adenoma, brain tumor surgery at [facility]- in 2015, following a reduction in visual fields.    Panhypopituitarism (HCC) (Primary Dx);  Secondary adrenal insufficiency (HCC);  Secondary hypothyroidism;   Diabetes mellitus type 2 in obese (HCC);  Secondary male hypogonadism;  Pituitary adenoma (HCC);  Hyperprolactinemia (HCC)       and was from there [facility]) referred  for a sleep test. Dx with OSA-  Set up w/ current machine ( 06-23-2021) by Lawson Prey 02/17/2015.   Does not need supplies. He is requesting rx for travel cpap. Aware may have to pay out- of pocket- typically not covered by insurance. His reports his renal function has been stabile since 2016.    Sleep relevant medical history: pituitary surgery for adenoma, pan hypo-pit. Not transnasal .   Family medical /sleep history: no  other family member on CPAP with OSA,.    Social history:  Patient is retired from delivery truck driving and lives in a household with spouse,  the couple has 2 sons, adult. 70 year old twins, no grandchildren. He used to drive irregular hours, early to late.  Pets are not present. Tobacco use-none .  ETOH use - never ,  Caffeine intake in form of Coffee( once a month) Soda( one a week) . Regular exercise in the gym, 3 times a week. .       Sleep habits are as follows: The patient's dinner time is between  5-6 PM. The patient goes to bed at 10 PM and is  promptly asleep, continues to sleep for 6 hours. The preferred sleep position is on the right side, with the support of 1-3 pillows.  Dreams are reportedly frequent/vivid.  6.30  AM is the usual rise time. The patient wakes up spontaneously.  He reports not feeling refreshed or restored in AM, with symptoms such as dry mouth, and residual fatigue. Naps are taken infrequently, lasting from 20 to 40 minutes and are more refreshing than nocturnal sleep.      The patient's sleep study took place at [facility] at the [place] in [place] 2016.  The patient was diagnosed with severe obstructive sleep apnea and titrated first to CPAP which she did not tolerate well he was then switched to BiPAP and to a final pressure of 14 over 9 cm water pressure with an  AHI of 0 and a total sleep time of 40.5 minutes at that pressure.  He continues to use his current BiPAP at exactly the same setting with a residual AHI of 3.5 which is excellent.  He has equal amounts of central and obstructive apneas but the residual AHI is low enough to ignore that.  He does have moderate to severe leakage with the current mask.  He is 97% compliant by days and time with an average use of 8 hours 10 minutes.  A machine for travel purposes need to be a BiPAP machine.  REVIEW OF SYSTEMS: Out of a complete 14 system review of symptoms, the patient complains only of the following symptoms, and all other reviewed systems are negative.  See HPI  ALLERGIES: No Known Allergies  HOME MEDICATIONS: Outpatient  Medications Prior to Visit  Medication Sig Dispense Refill   amLODipine  (NORVASC ) 10 MG tablet Take 10 mg by mouth daily.     calcitRIOL  (ROCALTROL ) 0.25 MCG capsule Take 0.25 mcg by mouth daily.     Finerenone (KERENDIA) 10 MG TABS Take by mouth.     hydrocortisone  (CORTEF ) 10 MG tablet Take 10-15 mg by mouth See admin instructions. Take 15mg  by mouth every morning and take 10mg  by mouth tablet in the evening.     insulin  degludec (TRESIBA ) 100 UNIT/ML FlexTouch Pen Inject 12 Units into the skin daily. (Patient taking differently: Inject 8 Units into the skin daily.) 3 mL 0   latanoprost  (XALATAN ) 0.005 % ophthalmic solution Place 1 drop into both eyes 2 (two) times daily.     levothyroxine  (SYNTHROID , LEVOTHROID) 88 MCG tablet Take 88 mcg by mouth daily before breakfast.     olmesartan-hydrochlorothiazide (BENICAR HCT) 20-12.5 MG tablet Take 1 tablet by mouth daily.     simvastatin  (ZOCOR ) 20 MG tablet Take 20 mg by mouth daily at 6 PM.      tamsulosin  (FLOMAX ) 0.4 MG CAPS capsule Take 0.4 mg by mouth daily.      TESTOSTERONE  COMPOUNDING KIT TD Place 1 mL onto the skin every morning.     timolol  (TIMOPTIC ) 0.5 % ophthalmic solution Place 1 drop into both eyes daily.       VITAMIN D  PO Take 1 tablet by mouth daily.     levETIRAcetam  (KEPPRA ) 500 MG tablet Take 1 tablet (500 mg total) by mouth 2 (two) times daily. 180 tablet 1   No facility-administered medications prior to visit.    PAST MEDICAL HISTORY: Past Medical History:  Diagnosis Date   CKD (chronic kidney disease)    Diabetes mellitus without complication (HCC)    DM (diabetes mellitus), type 2 (HCC)    History of cerebral meningioma    History of pituitary adenoma    Hypertension    Hypothyroidism    OSA (obstructive sleep apnea)    Panhypopituitarism (HCC)    Seizure disorder (HCC) 05/31/2017    PAST SURGICAL HISTORY: Past Surgical History:  Procedure Laterality Date   BRAIN SURGERY      FAMILY HISTORY: Family History  Problem Relation Age of Onset   Cancer - Colon Mother    Hypothyroidism Other    Diabetes Mellitus II Other    Cancer Other    Hypertension Other     SOCIAL HISTORY: Social History   Socioeconomic History   Marital status: Married    Spouse name: Bernardo Bridgeman   Number of children: 2   Years of education: 12   Highest education level: Not on file  Occupational History   Occupation: Retired  Tobacco Use   Smoking status: Never   Smokeless tobacco: Never  Vaping Use   Vaping status: Never Used  Substance and Sexual Activity   Alcohol use: Never    Comment: occassional   Drug use: No   Sexual activity: Never  Other Topics Concern   Not on file  Social History Narrative   Lives with wife   Married, 2 children   Right handed   12 th grade   Caffeine use: 1 per month or less   Social Drivers of Corporate investment banker Strain: Low Risk  (08/25/2023)   Overall Financial Resource Strain (CARDIA)    Difficulty of Paying Living Expenses: Not hard at all  Food Insecurity: No Food Insecurity (08/25/2023)   Hunger Vital Sign  Worried About Programme researcher, broadcasting/film/video in the Last Year: Never true    Ran Out of Food in the Last Year: Never true  Transportation  Needs: No Transportation Needs (08/25/2023)   PRAPARE - Administrator, Civil Service (Medical): No    Lack of Transportation (Non-Medical): No  Physical Activity: Insufficiently Active (08/25/2023)   Exercise Vital Sign    Days of Exercise per Week: 3 days    Minutes of Exercise per Session: 40 min  Stress: No Stress Concern Present (08/25/2023)   [employer] of Occupational Health - Occupational Stress Questionnaire    Feeling of Stress : Not at all  Social Connections: Moderately Integrated (08/25/2023)   Social Connection and Isolation Panel [NHANES]    Frequency of Communication with Friends and Family: Three times a week    Frequency of Social Gatherings with Friends and Family: Once a week    Attends Religious Services: More than 4 times per year    Active Member of [employer] or Organizations: No    Attends Banker Meetings: Never    Marital Status: Married  Catering manager Violence: Not At Risk (08/25/2023)   Humiliation, Afraid, Rape, and Kick questionnaire    Fear of Current or Ex-Partner: No    Emotionally Abused: No    Physically Abused: No    Sexually Abused: No   PHYSICAL EXAM  Vitals:   10/05/23 0812  BP: 126/88  Resp: 15  Weight: 246 lb (111.6 kg)  Height: 6' (1.829 m)   Body mass index is 33.36 kg/m.  Generalized: Well developed, in no acute distress  Neurological examination  Mentation: Alert oriented to time, place, history taking. Follows all commands speech and language fluent Cranial nerve II-XII: Pupils were equal round reactive to light. Extraocular movements were full, visual field were full on confrontational test. Facial sensation and strength were normal. Head turning and shoulder shrug  were normal and symmetric. Motor: The motor testing reveals 5 over 5 strength of all 4 extremities. Good symmetric motor tone is noted throughout.  Sensory: Sensory testing is intact to soft touch on all 4 extremities. No evidence of  extinction is noted.  Coordination: Cerebellar testing reveals good finger-nose-finger and heel-to-shin bilaterally.  Gait and station: Gait is normal and independent. Reflexes: Deep tendon reflexes are symmetric and normal bilaterally.   DIAGNOSTIC DATA (LABS, IMAGING, TESTING) - I reviewed patient records, labs, notes, testing and imaging myself where available.  Lab Results  Component Value Date   WBC 6.5 06/06/2022   HGB 12.1 (L) 06/06/2022   HCT 36.1 (L) 06/06/2022   MCV 75.7 (L) 06/06/2022   PLT 168 06/06/2022      Component Value Date/Time   NA 136 06/06/2022 0515   K 3.5 06/06/2022 0515   CL 103 06/06/2022 0515   CO2 23 06/06/2022 0515   GLUCOSE 233 (H) 06/06/2022 0515   BUN 30 (H) 06/06/2022 0515   CREATININE 2.72 (H) 06/06/2022 0515   CALCIUM 8.7 (L) 06/06/2022 0515   PROT 7.4 09/24/2021 0740   ALBUMIN 3.1 (L) 09/24/2021 0740   AST 19 09/24/2021 0740   ALT 15 09/24/2021 0740   ALKPHOS 61 09/24/2021 0740   BILITOT 1.8 (H) 09/24/2021 0740   GFRNONAA 24 (L) 06/06/2022 0515   GFRAA 29 (L) 02/26/2018 0350   Lab Results  Component Value Date   CHOL 122 09/03/2013   HDL 30 (L) 09/03/2013   LDLCALC 71 09/03/2013   TRIG 103 09/03/2013  Lab Results  Component Value Date   HGBA1C 6.7 (A) 06/02/2023   Lab Results  Component Value Date   VITAMINB12 302 12/31/2016   No results found for: "TSH"  Jeanmarie Millet, AGNP-C, DNP 10/05/2023, 8:59 AM [facility] [address], [address] [place], [place] [address] [phone]

## 2023-10-05 NOTE — Patient Instructions (Signed)
 Great to see you today.  Continue Keppra  for seizure prevention.  Continue nightly use of BiPAP for minimum 4 hours.  If you notice your mask is leaking please contact me and I can order a mask refit.  I hope your gamma knife procedure goes well tomorrow.  We will follow-up in 1 year.  Thanks!!

## 2023-10-06 DIAGNOSIS — D320 Benign neoplasm of cerebral meninges: Secondary | ICD-10-CM | POA: Diagnosis not present

## 2023-10-06 DIAGNOSIS — D329 Benign neoplasm of meninges, unspecified: Secondary | ICD-10-CM | POA: Diagnosis not present

## 2023-10-06 DIAGNOSIS — D352 Benign neoplasm of pituitary gland: Secondary | ICD-10-CM | POA: Diagnosis not present

## 2023-10-06 DIAGNOSIS — Z510 Encounter for antineoplastic radiation therapy: Secondary | ICD-10-CM | POA: Diagnosis not present

## 2023-11-02 DIAGNOSIS — H3589 Other specified retinal disorders: Secondary | ICD-10-CM | POA: Diagnosis not present

## 2023-11-02 DIAGNOSIS — H47233 Glaucomatous optic atrophy, bilateral: Secondary | ICD-10-CM | POA: Diagnosis not present

## 2023-11-02 DIAGNOSIS — H53433 Sector or arcuate defects, bilateral: Secondary | ICD-10-CM | POA: Diagnosis not present

## 2023-11-02 DIAGNOSIS — H401132 Primary open-angle glaucoma, bilateral, moderate stage: Secondary | ICD-10-CM | POA: Diagnosis not present

## 2023-11-02 DIAGNOSIS — E113291 Type 2 diabetes mellitus with mild nonproliferative diabetic retinopathy without macular edema, right eye: Secondary | ICD-10-CM | POA: Diagnosis not present

## 2023-11-02 DIAGNOSIS — H5340 Unspecified visual field defects: Secondary | ICD-10-CM | POA: Diagnosis not present

## 2023-11-28 DIAGNOSIS — N390 Urinary tract infection, site not specified: Secondary | ICD-10-CM | POA: Diagnosis not present

## 2023-11-28 DIAGNOSIS — N1832 Chronic kidney disease, stage 3b: Secondary | ICD-10-CM | POA: Diagnosis not present

## 2023-11-28 LAB — COMPREHENSIVE METABOLIC PANEL WITH GFR
Albumin: 3.9 (ref 3.5–5.0)
Calcium: 9.9 (ref 8.7–10.7)
eGFR: 22

## 2023-11-28 LAB — BASIC METABOLIC PANEL WITH GFR
BUN: 38 — AB (ref 4–21)
CO2: 19 (ref 13–22)
Chloride: 106 (ref 99–108)
Creatinine: 2.9 — AB (ref 0.6–1.3)
Glucose: 99
Potassium: 4.5 meq/L (ref 3.5–5.1)
Sodium: 139 (ref 137–147)

## 2023-11-28 LAB — VITAMIN D 25 HYDROXY (VIT D DEFICIENCY, FRACTURES): Vit D, 25-Hydroxy: 70.8

## 2023-12-05 DIAGNOSIS — N2581 Secondary hyperparathyroidism of renal origin: Secondary | ICD-10-CM | POA: Diagnosis not present

## 2023-12-05 DIAGNOSIS — N1832 Chronic kidney disease, stage 3b: Secondary | ICD-10-CM | POA: Diagnosis not present

## 2023-12-05 DIAGNOSIS — I129 Hypertensive chronic kidney disease with stage 1 through stage 4 chronic kidney disease, or unspecified chronic kidney disease: Secondary | ICD-10-CM | POA: Diagnosis not present

## 2023-12-05 DIAGNOSIS — D631 Anemia in chronic kidney disease: Secondary | ICD-10-CM | POA: Diagnosis not present

## 2023-12-08 ENCOUNTER — Encounter: Payer: Self-pay | Admitting: Internal Medicine

## 2023-12-20 DIAGNOSIS — N1832 Chronic kidney disease, stage 3b: Secondary | ICD-10-CM | POA: Diagnosis not present

## 2024-02-10 DIAGNOSIS — E1122 Type 2 diabetes mellitus with diabetic chronic kidney disease: Secondary | ICD-10-CM | POA: Diagnosis not present

## 2024-02-10 DIAGNOSIS — E038 Other specified hypothyroidism: Secondary | ICD-10-CM | POA: Diagnosis not present

## 2024-02-10 DIAGNOSIS — R809 Proteinuria, unspecified: Secondary | ICD-10-CM | POA: Diagnosis not present

## 2024-02-10 DIAGNOSIS — N184 Chronic kidney disease, stage 4 (severe): Secondary | ICD-10-CM | POA: Diagnosis not present

## 2024-02-10 DIAGNOSIS — Z794 Long term (current) use of insulin: Secondary | ICD-10-CM | POA: Diagnosis not present

## 2024-02-10 DIAGNOSIS — E2749 Other adrenocortical insufficiency: Secondary | ICD-10-CM | POA: Diagnosis not present

## 2024-02-10 DIAGNOSIS — I129 Hypertensive chronic kidney disease with stage 1 through stage 4 chronic kidney disease, or unspecified chronic kidney disease: Secondary | ICD-10-CM | POA: Diagnosis not present

## 2024-02-10 DIAGNOSIS — E1165 Type 2 diabetes mellitus with hyperglycemia: Secondary | ICD-10-CM | POA: Diagnosis not present

## 2024-02-10 DIAGNOSIS — E221 Hyperprolactinemia: Secondary | ICD-10-CM | POA: Diagnosis not present

## 2024-02-10 DIAGNOSIS — E1129 Type 2 diabetes mellitus with other diabetic kidney complication: Secondary | ICD-10-CM | POA: Diagnosis not present

## 2024-02-10 DIAGNOSIS — E230 Hypopituitarism: Secondary | ICD-10-CM | POA: Diagnosis not present

## 2024-02-16 DIAGNOSIS — E291 Testicular hypofunction: Secondary | ICD-10-CM | POA: Diagnosis not present

## 2024-02-23 DIAGNOSIS — C61 Malignant neoplasm of prostate: Secondary | ICD-10-CM | POA: Diagnosis not present

## 2024-02-23 DIAGNOSIS — E291 Testicular hypofunction: Secondary | ICD-10-CM | POA: Diagnosis not present

## 2024-02-23 DIAGNOSIS — N390 Urinary tract infection, site not specified: Secondary | ICD-10-CM | POA: Diagnosis not present

## 2024-02-27 DIAGNOSIS — N1832 Chronic kidney disease, stage 3b: Secondary | ICD-10-CM | POA: Diagnosis not present

## 2024-02-27 DIAGNOSIS — N390 Urinary tract infection, site not specified: Secondary | ICD-10-CM | POA: Diagnosis not present

## 2024-02-27 LAB — COMPREHENSIVE METABOLIC PANEL WITH GFR
Albumin: 3.8 (ref 3.5–5.0)
Calcium: 9.7 (ref 8.7–10.7)
eGFR: 24

## 2024-02-27 LAB — BASIC METABOLIC PANEL WITH GFR
BUN: 37 — AB (ref 4–21)
CO2: 24 — AB (ref 13–22)
Chloride: 101 (ref 99–108)
Creatinine: 2.7 — AB (ref 0.6–1.3)
Glucose: 96
Potassium: 4.1 meq/L (ref 3.5–5.1)
Sodium: 136 — AB (ref 137–147)

## 2024-02-27 LAB — VITAMIN D 25 HYDROXY (VIT D DEFICIENCY, FRACTURES): Vit D, 25-Hydroxy: 70.5

## 2024-02-27 LAB — CBC AND DIFFERENTIAL
HCT: 32 — AB (ref 41–53)
Hemoglobin: 10.1 — AB (ref 13.5–17.5)

## 2024-03-05 DIAGNOSIS — I129 Hypertensive chronic kidney disease with stage 1 through stage 4 chronic kidney disease, or unspecified chronic kidney disease: Secondary | ICD-10-CM | POA: Diagnosis not present

## 2024-03-05 DIAGNOSIS — D631 Anemia in chronic kidney disease: Secondary | ICD-10-CM | POA: Diagnosis not present

## 2024-03-05 DIAGNOSIS — N184 Chronic kidney disease, stage 4 (severe): Secondary | ICD-10-CM | POA: Diagnosis not present

## 2024-03-05 DIAGNOSIS — N2581 Secondary hyperparathyroidism of renal origin: Secondary | ICD-10-CM | POA: Diagnosis not present

## 2024-03-06 ENCOUNTER — Encounter: Payer: Self-pay | Admitting: Internal Medicine

## 2024-03-07 ENCOUNTER — Ambulatory Visit: Admitting: Internal Medicine

## 2024-05-17 ENCOUNTER — Other Ambulatory Visit: Payer: Self-pay

## 2024-05-17 ENCOUNTER — Inpatient Hospital Stay (HOSPITAL_COMMUNITY): Admitting: Anesthesiology

## 2024-05-17 ENCOUNTER — Encounter (HOSPITAL_COMMUNITY): Admission: EM | Disposition: A | Payer: Self-pay | Source: Home / Self Care | Attending: Family Medicine

## 2024-05-17 ENCOUNTER — Encounter (HOSPITAL_COMMUNITY): Payer: Self-pay

## 2024-05-17 ENCOUNTER — Emergency Department (HOSPITAL_COMMUNITY)

## 2024-05-17 ENCOUNTER — Inpatient Hospital Stay (HOSPITAL_COMMUNITY)
Admission: EM | Admit: 2024-05-17 | Discharge: 2024-05-20 | DRG: 486 | Disposition: A | Discharge status: Home / Self Care | Attending: Family Medicine | Admitting: Family Medicine

## 2024-05-17 DIAGNOSIS — M79662 Pain in left lower leg: Secondary | ICD-10-CM | POA: Diagnosis not present

## 2024-05-17 DIAGNOSIS — Z6833 Body mass index (BMI) 33.0-33.9, adult: Secondary | ICD-10-CM | POA: Diagnosis not present

## 2024-05-17 DIAGNOSIS — M25562 Pain in left knee: Secondary | ICD-10-CM

## 2024-05-17 DIAGNOSIS — E038 Other specified hypothyroidism: Secondary | ICD-10-CM | POA: Diagnosis present

## 2024-05-17 DIAGNOSIS — M00869 Arthritis due to other bacteria, unspecified knee: Secondary | ICD-10-CM | POA: Diagnosis not present

## 2024-05-17 DIAGNOSIS — D631 Anemia in chronic kidney disease: Secondary | ICD-10-CM | POA: Diagnosis present

## 2024-05-17 DIAGNOSIS — W19XXXA Unspecified fall, initial encounter: Secondary | ICD-10-CM | POA: Diagnosis present

## 2024-05-17 DIAGNOSIS — I129 Hypertensive chronic kidney disease with stage 1 through stage 4 chronic kidney disease, or unspecified chronic kidney disease: Secondary | ICD-10-CM

## 2024-05-17 DIAGNOSIS — M00862 Arthritis due to other bacteria, left knee: Principal | ICD-10-CM | POA: Diagnosis present

## 2024-05-17 DIAGNOSIS — E1122 Type 2 diabetes mellitus with diabetic chronic kidney disease: Secondary | ICD-10-CM | POA: Diagnosis present

## 2024-05-17 DIAGNOSIS — E66811 Obesity, class 1: Secondary | ICD-10-CM | POA: Diagnosis present

## 2024-05-17 DIAGNOSIS — I82462 Acute embolism and thrombosis of left calf muscular vein: Secondary | ICD-10-CM | POA: Diagnosis present

## 2024-05-17 DIAGNOSIS — M009 Pyogenic arthritis, unspecified: Secondary | ICD-10-CM | POA: Diagnosis present

## 2024-05-17 DIAGNOSIS — E230 Hypopituitarism: Secondary | ICD-10-CM | POA: Diagnosis present

## 2024-05-17 DIAGNOSIS — E538 Deficiency of other specified B group vitamins: Secondary | ICD-10-CM | POA: Diagnosis present

## 2024-05-17 DIAGNOSIS — Z79899 Other long term (current) drug therapy: Secondary | ICD-10-CM

## 2024-05-17 DIAGNOSIS — Z79890 Hormone replacement therapy (postmenopausal): Secondary | ICD-10-CM | POA: Diagnosis not present

## 2024-05-17 DIAGNOSIS — N400 Enlarged prostate without lower urinary tract symptoms: Secondary | ICD-10-CM | POA: Diagnosis present

## 2024-05-17 DIAGNOSIS — Z794 Long term (current) use of insulin: Secondary | ICD-10-CM | POA: Diagnosis not present

## 2024-05-17 DIAGNOSIS — Z8249 Family history of ischemic heart disease and other diseases of the circulatory system: Secondary | ICD-10-CM | POA: Diagnosis not present

## 2024-05-17 DIAGNOSIS — N189 Chronic kidney disease, unspecified: Secondary | ICD-10-CM | POA: Diagnosis not present

## 2024-05-17 DIAGNOSIS — H409 Unspecified glaucoma: Secondary | ICD-10-CM | POA: Diagnosis present

## 2024-05-17 DIAGNOSIS — M1310 Monoarthritis, not elsewhere classified, unspecified site: Secondary | ICD-10-CM

## 2024-05-17 DIAGNOSIS — M109 Gout, unspecified: Secondary | ICD-10-CM | POA: Diagnosis present

## 2024-05-17 DIAGNOSIS — N184 Chronic kidney disease, stage 4 (severe): Secondary | ICD-10-CM | POA: Diagnosis present

## 2024-05-17 DIAGNOSIS — E2740 Unspecified adrenocortical insufficiency: Secondary | ICD-10-CM | POA: Diagnosis present

## 2024-05-17 DIAGNOSIS — E785 Hyperlipidemia, unspecified: Secondary | ICD-10-CM | POA: Diagnosis present

## 2024-05-17 DIAGNOSIS — Z833 Family history of diabetes mellitus: Secondary | ICD-10-CM | POA: Diagnosis not present

## 2024-05-17 DIAGNOSIS — G40909 Epilepsy, unspecified, not intractable, without status epilepticus: Secondary | ICD-10-CM | POA: Diagnosis present

## 2024-05-17 DIAGNOSIS — G4733 Obstructive sleep apnea (adult) (pediatric): Secondary | ICD-10-CM | POA: Diagnosis present

## 2024-05-17 DIAGNOSIS — Z7901 Long term (current) use of anticoagulants: Secondary | ICD-10-CM

## 2024-05-17 HISTORY — PX: KNEE ARTHROSCOPY W/ DEBRIDEMENT: SHX1867

## 2024-05-17 LAB — COMPREHENSIVE METABOLIC PANEL WITH GFR
ALT: 16 U/L (ref 0–44)
AST: 31 U/L (ref 15–41)
Albumin: 3.1 g/dL — ABNORMAL LOW (ref 3.5–5.0)
Alkaline Phosphatase: 61 U/L (ref 38–126)
Anion gap: 13 (ref 5–15)
BUN: 37 mg/dL — ABNORMAL HIGH (ref 8–23)
CO2: 23 mmol/L (ref 22–32)
Calcium: 8.9 mg/dL (ref 8.9–10.3)
Chloride: 102 mmol/L (ref 98–111)
Creatinine, Ser: 3.01 mg/dL — ABNORMAL HIGH (ref 0.61–1.24)
GFR, Estimated: 21 mL/min — ABNORMAL LOW
Glucose, Bld: 85 mg/dL (ref 70–99)
Potassium: 3.3 mmol/L — ABNORMAL LOW (ref 3.5–5.1)
Sodium: 137 mmol/L (ref 135–145)
Total Bilirubin: 1 mg/dL (ref 0.0–1.2)
Total Protein: 6.9 g/dL (ref 6.5–8.1)

## 2024-05-17 LAB — C-REACTIVE PROTEIN: CRP: 29 mg/dL — ABNORMAL HIGH

## 2024-05-17 LAB — SYNOVIAL CELL COUNT + DIFF, W/ CRYSTALS
Eosinophils-Synovial: 0 % (ref 0–1)
Lymphocytes-Synovial Fld: 1 % (ref 0–20)
Monocyte-Macrophage-Synovial Fluid: 7 % — ABNORMAL LOW (ref 50–90)
Neutrophil, Synovial: 92 % — ABNORMAL HIGH (ref 0–25)
WBC, Synovial: 64500 /mm3 — ABNORMAL HIGH (ref 0–200)

## 2024-05-17 LAB — CBC WITH DIFFERENTIAL/PLATELET
Abs Immature Granulocytes: 0.12 K/uL — ABNORMAL HIGH (ref 0.00–0.07)
Basophils Absolute: 0.1 K/uL (ref 0.0–0.1)
Basophils Relative: 0 %
Eosinophils Absolute: 0.1 K/uL (ref 0.0–0.5)
Eosinophils Relative: 1 %
HCT: 30.1 % — ABNORMAL LOW (ref 39.0–52.0)
Hemoglobin: 9.6 g/dL — ABNORMAL LOW (ref 13.0–17.0)
Immature Granulocytes: 1 %
Lymphocytes Relative: 19 %
Lymphs Abs: 2.7 K/uL (ref 0.7–4.0)
MCH: 24.7 pg — ABNORMAL LOW (ref 26.0–34.0)
MCHC: 31.9 g/dL (ref 30.0–36.0)
MCV: 77.4 fL — ABNORMAL LOW (ref 80.0–100.0)
Monocytes Absolute: 1.8 K/uL — ABNORMAL HIGH (ref 0.1–1.0)
Monocytes Relative: 13 %
Neutro Abs: 9.6 K/uL — ABNORMAL HIGH (ref 1.7–7.7)
Neutrophils Relative %: 66 %
Platelets: 181 K/uL (ref 150–400)
RBC: 3.89 MIL/uL — ABNORMAL LOW (ref 4.22–5.81)
RDW: 16.8 % — ABNORMAL HIGH (ref 11.5–15.5)
WBC: 14.3 K/uL — ABNORMAL HIGH (ref 4.0–10.5)
nRBC: 0 % (ref 0.0–0.2)

## 2024-05-17 LAB — I-STAT CG4 LACTIC ACID, ED
Lactic Acid, Venous: 1.3 mmol/L (ref 0.5–1.9)
Lactic Acid, Venous: 1.3 mmol/L (ref 0.5–1.9)

## 2024-05-17 LAB — GLUCOSE, CAPILLARY
Glucose-Capillary: 132 mg/dL — ABNORMAL HIGH (ref 70–99)
Glucose-Capillary: 134 mg/dL — ABNORMAL HIGH (ref 70–99)

## 2024-05-17 LAB — SEDIMENTATION RATE: Sed Rate: 79 mm/h — ABNORMAL HIGH (ref 0–16)

## 2024-05-17 MED ORDER — METOCLOPRAMIDE HCL 5 MG PO TABS: 5.0000 mg | ORAL_TABLET | Freq: Three times a day (TID) | ORAL | Status: DC | PRN

## 2024-05-17 MED ORDER — HYDROCORTISONE SOD SUC (PF) 100 MG IJ SOLR: 50.0000 mg | Freq: Once | INTRAMUSCULAR | Status: DC

## 2024-05-17 MED ORDER — OXYCODONE HCL 5 MG PO TABS: 10.0000 mg | ORAL_TABLET | ORAL | Status: DC | PRN

## 2024-05-17 MED ORDER — SODIUM CHLORIDE 0.9 % IV SOLN
2.0000 g | Freq: Once | INTRAVENOUS | Status: AC
  Administered 2024-05-17: 2 g via INTRAVENOUS
  Filled 2024-05-17: qty 20

## 2024-05-17 MED ORDER — BUPIVACAINE HCL (PF) 0.5 % IJ SOLN
10.0000 mL | Freq: Once | INTRAMUSCULAR | Status: AC
  Administered 2024-05-17: 10 mL
  Filled 2024-05-17: qty 10

## 2024-05-17 MED ORDER — ONDANSETRON HCL 4 MG PO TABS: 4.0000 mg | ORAL_TABLET | Freq: Four times a day (QID) | ORAL | Status: DC | PRN

## 2024-05-17 MED ORDER — OXYCODONE HCL 5 MG PO TABS: 5.0000 mg | ORAL_TABLET | ORAL | Status: DC | PRN

## 2024-05-17 MED ORDER — PHENYLEPHRINE HCL-NACL 20-0.9 MG/250ML-% IV SOLN
INTRAVENOUS | Status: DC | PRN
  Administered 2024-05-17: 25 ug/min via INTRAVENOUS

## 2024-05-17 MED ORDER — EPINEPHRINE PF 1 MG/ML IJ SOLN
INTRAMUSCULAR | Status: AC
  Filled 2024-05-17: qty 1

## 2024-05-17 MED ORDER — PROPOFOL 10 MG/ML IV BOLUS
INTRAVENOUS | Status: AC
  Filled 2024-05-17: qty 20

## 2024-05-17 MED ORDER — ONDANSETRON HCL 4 MG/2ML IJ SOLN: 4.0000 mg | Freq: Four times a day (QID) | INTRAMUSCULAR | Status: DC | PRN

## 2024-05-17 MED ORDER — DOCUSATE SODIUM 100 MG PO CAPS
100.0000 mg | ORAL_CAPSULE | Freq: Two times a day (BID) | ORAL | Status: DC
  Administered 2024-05-18 – 2024-05-20 (×5): 100 mg via ORAL
  Filled 2024-05-17 (×5): qty 1

## 2024-05-17 MED ORDER — LIDOCAINE-EPINEPHRINE (PF) 2 %-1:200000 IJ SOLN
10.0000 mL | Freq: Once | INTRAMUSCULAR | Status: AC
  Administered 2024-05-17: 10 mL
  Filled 2024-05-17: qty 20

## 2024-05-17 MED ORDER — ACETAMINOPHEN 325 MG PO TABS: 325.0000 mg | ORAL_TABLET | Freq: Four times a day (QID) | ORAL | Status: DC | PRN

## 2024-05-17 MED ORDER — ONDANSETRON HCL 4 MG/2ML IJ SOLN
INTRAMUSCULAR | Status: DC | PRN
  Administered 2024-05-17: 4 mg via INTRAVENOUS

## 2024-05-17 MED ORDER — INSULIN ASPART 100 UNIT/ML IJ SOLN: 0.0000 [IU] | Freq: Every day | INTRAMUSCULAR | Status: DC

## 2024-05-17 MED ORDER — LEVOTHYROXINE SODIUM 88 MCG PO TABS
88.0000 ug | ORAL_TABLET | Freq: Every day | ORAL | Status: DC
  Administered 2024-05-18 – 2024-05-20 (×3): 88 ug via ORAL
  Filled 2024-05-17 (×3): qty 1

## 2024-05-17 MED ORDER — VANCOMYCIN HCL 2000 MG/400ML IV SOLN
2000.0000 mg | Freq: Once | INTRAVENOUS | Status: AC
  Administered 2024-05-17: 2000 mg via INTRAVENOUS
  Filled 2024-05-17: qty 400

## 2024-05-17 MED ORDER — CHLORHEXIDINE GLUCONATE 0.12 % MT SOLN: 15.0000 mL | Freq: Once | OROMUCOSAL | Status: AC

## 2024-05-17 MED ORDER — METOCLOPRAMIDE HCL 5 MG/ML IJ SOLN: 5.0000 mg | Freq: Three times a day (TID) | INTRAMUSCULAR | Status: DC | PRN

## 2024-05-17 MED ORDER — HYDROCORTISONE 10 MG PO TABS
10.0000 mg | ORAL_TABLET | Freq: Every evening | ORAL | Status: DC
  Filled 2024-05-17: qty 1

## 2024-05-17 MED ORDER — VANCOMYCIN HCL 1250 MG/250ML IV SOLN
1250.0000 mg | INTRAVENOUS | Status: DC
  Administered 2024-05-17: 1250 mg via INTRAVENOUS
  Filled 2024-05-17: qty 250

## 2024-05-17 MED ORDER — ALBUMIN HUMAN 5 % IV SOLN: INTRAVENOUS | Status: DC | PRN

## 2024-05-17 MED ORDER — ACETAMINOPHEN 325 MG PO TABS: 650.0000 mg | ORAL_TABLET | Freq: Four times a day (QID) | ORAL | Status: DC | PRN

## 2024-05-17 MED ORDER — BUPIVACAINE HCL (PF) 0.25 % IJ SOLN
INTRAMUSCULAR | Status: AC
  Filled 2024-05-17: qty 30

## 2024-05-17 MED ORDER — TAMSULOSIN HCL 0.4 MG PO CAPS
0.4000 mg | ORAL_CAPSULE | Freq: Every day | ORAL | Status: DC
  Administered 2024-05-17 – 2024-05-20 (×3): 0.4 mg via ORAL
  Filled 2024-05-17 (×3): qty 1

## 2024-05-17 MED ORDER — OXYCODONE HCL 5 MG/5ML PO SOLN: 5.0000 mg | Freq: Once | ORAL | Status: DC | PRN

## 2024-05-17 MED ORDER — FENTANYL CITRATE (PF) 250 MCG/5ML IJ SOLN
INTRAMUSCULAR | Status: AC
  Filled 2024-05-17: qty 5

## 2024-05-17 MED ORDER — HYDROCORTISONE 5 MG PO TABS: 15.0000 mg | ORAL_TABLET | Freq: Every day | ORAL | Status: DC

## 2024-05-17 MED ORDER — POVIDONE-IODINE 10 % EX SWAB: 2.0000 | Freq: Once | CUTANEOUS | Status: DC

## 2024-05-17 MED ORDER — EPHEDRINE SULFATE-NACL 50-0.9 MG/10ML-% IV SOSY
PREFILLED_SYRINGE | INTRAVENOUS | Status: DC | PRN
  Administered 2024-05-17 (×2): 10 mg via INTRAVENOUS

## 2024-05-17 MED ORDER — LIDOCAINE 2% (20 MG/ML) 5 ML SYRINGE
INTRAMUSCULAR | Status: DC | PRN
  Administered 2024-05-17 (×2): 50 mg via INTRAVENOUS

## 2024-05-17 MED ORDER — SODIUM CHLORIDE 0.9 % IR SOLN
Status: DC | PRN
  Administered 2024-05-17: 3000 mL
  Administered 2024-05-17: 6000 mL

## 2024-05-17 MED ORDER — VANCOMYCIN HCL 1000 MG IV SOLR
INTRAVENOUS | Status: AC
  Filled 2024-05-17: qty 60

## 2024-05-17 MED ORDER — ROCURONIUM BROMIDE 10 MG/ML (PF) SYRINGE
PREFILLED_SYRINGE | INTRAVENOUS | Status: DC | PRN
  Administered 2024-05-17: 50 mg via INTRAVENOUS
  Administered 2024-05-17: 30 mg via INTRAVENOUS

## 2024-05-17 MED ORDER — FENTANYL CITRATE (PF) 250 MCG/5ML IJ SOLN
INTRAMUSCULAR | Status: DC | PRN
  Administered 2024-05-17: 50 ug via INTRAVENOUS

## 2024-05-17 MED ORDER — ORAL CARE MOUTH RINSE: 15.0000 mL | Freq: Once | OROMUCOSAL | Status: AC

## 2024-05-17 MED ORDER — HYDROCORTISONE 5 MG PO TABS
15.0000 mg | ORAL_TABLET | Freq: Every day | ORAL | Status: DC
  Administered 2024-05-17: 15 mg via ORAL
  Filled 2024-05-17: qty 1

## 2024-05-17 MED ORDER — FENTANYL CITRATE (PF) 100 MCG/2ML IJ SOLN: 25.0000 ug | INTRAMUSCULAR | Status: DC | PRN

## 2024-05-17 MED ORDER — CHLORHEXIDINE GLUCONATE 0.12 % MT SOLN
OROMUCOSAL | Status: AC
  Administered 2024-05-17: 15 mL via OROMUCOSAL
  Filled 2024-05-17: qty 15

## 2024-05-17 MED ORDER — INSULIN ASPART 100 UNIT/ML IJ SOLN
0.0000 [IU] | Freq: Three times a day (TID) | INTRAMUSCULAR | Status: DC
  Administered 2024-05-18 (×2): 2 [IU] via SUBCUTANEOUS
  Administered 2024-05-18 – 2024-05-20 (×5): 1 [IU] via SUBCUTANEOUS
  Filled 2024-05-17 (×3): qty 1
  Filled 2024-05-17 (×2): qty 2
  Filled 2024-05-17: qty 1
  Filled 2024-05-17: qty 5

## 2024-05-17 MED ORDER — PHENYLEPHRINE 80 MCG/ML (10ML) SYRINGE FOR IV PUSH (FOR BLOOD PRESSURE SUPPORT)
PREFILLED_SYRINGE | INTRAVENOUS | Status: DC | PRN
  Administered 2024-05-17 (×5): 160 ug via INTRAVENOUS

## 2024-05-17 MED ORDER — ACETAMINOPHEN 500 MG PO TABS
500.0000 mg | ORAL_TABLET | Freq: Four times a day (QID) | ORAL | Status: DC
  Administered 2024-05-18: 500 mg via ORAL
  Filled 2024-05-17: qty 1

## 2024-05-17 MED ORDER — SODIUM CHLORIDE 0.9 % IV SOLN: INTRAVENOUS | Status: DC

## 2024-05-17 MED ORDER — POTASSIUM CHLORIDE 20 MEQ PO PACK
40.0000 meq | PACK | Freq: Once | ORAL | Status: AC
  Administered 2024-05-17: 40 meq via ORAL
  Filled 2024-05-17: qty 2

## 2024-05-17 MED ORDER — LEVETIRACETAM 500 MG PO TABS
500.0000 mg | ORAL_TABLET | Freq: Two times a day (BID) | ORAL | Status: DC
  Administered 2024-05-17 – 2024-05-20 (×6): 500 mg via ORAL
  Filled 2024-05-17 (×5): qty 1

## 2024-05-17 MED ORDER — CEFAZOLIN SODIUM-DEXTROSE 2-4 GM/100ML-% IV SOLN: 2.0000 g | Freq: Four times a day (QID) | INTRAVENOUS | Status: DC

## 2024-05-17 MED ORDER — ACETAMINOPHEN 650 MG RE SUPP: 650.0000 mg | Freq: Four times a day (QID) | RECTAL | Status: DC | PRN

## 2024-05-17 MED ORDER — HYDROCORTISONE SOD SUC (PF) 100 MG IJ SOLR
25.0000 mg | Freq: Three times a day (TID) | INTRAMUSCULAR | Status: AC
  Administered 2024-05-18 (×3): 25 mg via INTRAVENOUS
  Filled 2024-05-17 (×3): qty 0.5

## 2024-05-17 MED ORDER — CALCITRIOL 0.25 MCG PO CAPS
0.2500 ug | ORAL_CAPSULE | Freq: Every day | ORAL | Status: DC
  Administered 2024-05-18 – 2024-05-20 (×3): 0.25 ug via ORAL
  Filled 2024-05-17 (×3): qty 1

## 2024-05-17 MED ORDER — ENOXAPARIN SODIUM 30 MG/0.3ML IJ SOSY
30.0000 mg | PREFILLED_SYRINGE | INTRAMUSCULAR | Status: DC
  Administered 2024-05-18: 30 mg via SUBCUTANEOUS
  Filled 2024-05-17: qty 0.3

## 2024-05-17 MED ORDER — SODIUM CHLORIDE 0.9 % IV SOLN
2.0000 g | INTRAVENOUS | Status: DC
  Filled 2024-05-17: qty 20

## 2024-05-17 MED ORDER — CHLORHEXIDINE GLUCONATE 4 % EX SOLN: 60.0000 mL | Freq: Once | CUTANEOUS | Status: DC

## 2024-05-17 MED ORDER — VANCOMYCIN HCL 1500 MG/300ML IV SOLN: 1500.0000 mg | Freq: Once | INTRAVENOUS | Status: DC

## 2024-05-17 MED ORDER — SODIUM CHLORIDE 0.9 % IR SOLN
Status: DC | PRN
  Administered 2024-05-17: 1 mL

## 2024-05-17 MED ORDER — CEFAZOLIN SODIUM-DEXTROSE 2-4 GM/100ML-% IV SOLN: 2.0000 g | INTRAVENOUS | Status: DC

## 2024-05-17 MED ORDER — SUGAMMADEX SODIUM 200 MG/2ML IV SOLN
INTRAVENOUS | Status: DC | PRN
  Administered 2024-05-17: 200 mg via INTRAVENOUS

## 2024-05-17 MED ORDER — SODIUM CHLORIDE 0.9 % IV BOLUS
1000.0000 mL | Freq: Once | INTRAVENOUS | Status: AC
  Administered 2024-05-17: 1000 mL via INTRAVENOUS

## 2024-05-17 MED ORDER — POLYETHYLENE GLYCOL 3350 17 G PO PACK
17.0000 g | PACK | Freq: Two times a day (BID) | ORAL | Status: DC
  Administered 2024-05-18 – 2024-05-19 (×4): 17 g via ORAL
  Filled 2024-05-17 (×4): qty 1

## 2024-05-17 MED ORDER — OXYCODONE HCL 5 MG PO TABS: 5.0000 mg | ORAL_TABLET | Freq: Once | ORAL | Status: DC | PRN

## 2024-05-17 MED ORDER — MORPHINE SULFATE (PF) 2 MG/ML IV SOLN: 0.5000 mg | INTRAVENOUS | Status: DC | PRN

## 2024-05-17 MED ORDER — DEXAMETHASONE SOD PHOSPHATE PF 10 MG/ML IJ SOLN
INTRAMUSCULAR | Status: DC | PRN
  Administered 2024-05-17: 10 mg via INTRAVENOUS

## 2024-05-17 MED ORDER — HYDROCODONE-ACETAMINOPHEN 5-325 MG PO TABS
1.0000 | ORAL_TABLET | Freq: Once | ORAL | Status: AC
  Administered 2024-05-17: 1 via ORAL
  Filled 2024-05-17: qty 1

## 2024-05-17 NOTE — ED Triage Notes (Signed)
 Pt BIB [facility] from home d/t fall @ 2200.  He helped himself down to the floor so technically did not fall but wanted to sleep on fllor d/t left leg pain.    BP 140/92 HR 120 RR 16 97% on RA

## 2024-05-17 NOTE — Op Note (Signed)
 Surgery Date: 05/17/2024  Surgeon(s): Teresa Rankin ORN, MD  ANESTHESIA:  general  FLUIDS: Per anesthesia record.   ESTIMATED BLOOD LOSS: minimal  PREOPERATIVE DIAGNOSES:  1. Left knee septic arthritis  POSTOPERATIVE DIAGNOSES:  same  PROCEDURES PERFORMED:  1. Left knee arthroscopy irrigation and debridement with partial synovectomy  INDICATIONS: Trevor Kelley is a 74 y.o. male with 5 day history of left knee pain and was found on aspiration performed today to have septic arthritis with WBC 64500 with 92% neutrophils.  Given these findings he was indicated to undergo left knee arthroscopy with irrigation and debridement, and subtotal synovectomy to assist with resolution of his infection and prevent further intra articular damage.  Full discussion held regarding risks benefits alternatives and complications related surgical intervention. Risks discussed included, but were not limited to bleeding, damage to surrounding structures such as nerves arteries and veins, persistence of infection and potential need for additional procedures, progression of arthritis despite surgical intervention, arthrofibrosis and persistent knee disability, and risks of anesthesia. All questions answered. The patient understood and elected to proceed with surgical intervention as described.  He was found to have painful range of motion of the left knee 0-70 deg flexion/extension with micromotion tenderness, warmth and a large effusion of the left knee with sensation intact to light touch sural, saphenous, tibial, deep and superficial peroneal nerve distributions and ability to dorsiflex and plantarflex the ankle with 2+ DP pulse.  DESCRIPTION OF PROCEDURE:   The patient was identified in the preoperative holding area and the operative extremity was marked. The patient was brought to the operating room and transferred to operating table in a supine position. Satisfactory general anesthesia was induced by anesthesiology.   A time out was called confirming the patient, surgical procedure, surgical site and laterality, that antibiotics had been held prior to cultures and all present agreed.  Standard anterolateral, anteromedial arthroscopy portals were obtained. Upon introduction of the trochar into the anterolateral portal there was a large egress of synovial fluid and debris from the knee which was captured in a specimen cup and sent for cultures. Antibiotics were then given at that time. The anteromedial portal was obtained with a spinal needle for localization under direct visualization with subsequent diagnostic findings.   Anteromedial and anterolateral chambers: severe synovitis with purulent fluid present The synovitis was debrided with a 4.0 mm full radius shaver through both the anteromedial and lateral portals.   Suprapatellar pouch and gutters: severe synovitis or debris. Patella chondral surface: Grade 2 Trochlear chondral surface: Grade 3 Patellofemoral tracking: midline Medial meniscus: intact with chondrocalcinosis.  Medial femoral condyle flexion bearing surface: Grade 2 Medial femoral condyle extension bearing surface: Grade 2 Medial tibial plateau: Grade 2 Anterior cruciate ligament:stable Posterior cruciate ligament:stable Lateral meniscus: Mild fraying with chondrocalcinosis.   Lateral femoral condyle flexion bearing surface: Grade 1 Lateral femoral condyle extension bearing surface: Grade 2 Lateral tibial plateau: Grade 2  Due to the extensive purulence and synovitis arthroscopic irrigation and debridement was undertaken with extensive debridement and subtotal synovectomy involving the medial, lateral suprapatellar, and patellofemoral compartments as well as the medial and lateral gutters using the shaver and radiofrequency ablation wand to maintain hemostasis. Satisfied with the degree of debridement, a metal cannula was introduced via the anteromedial portal and the knee was further irrigated  with an additional 2 L of fluid to remove any remaining debris. After completion of synovectomy, diagnostic exam, and debridements as described, all compartments were checked and no residual debris remained. Hemostasis was  achieved with the cautery wand. The portals were approximated with buried monocryl. All excess fluid was expressed from the joint.  Steri strips and sterile gauze dressings were applied followed by Ace bandage and ice pack.   DISPOSITION: The patient was awakened from general anesthetic, extubated, taken to the recovery room in medically stable condition, no apparent complications. The patient may be weightbearing as tolerated to the operative lower extremity.  Range of motion of right knee as tolerated. We will begin IV antibiotics in the form of vancomycin  and rocephin  and ID was consulted for assistance with antibiotic management.  The patient will continued to be followed with clinic exam and q2 day ESR and CRP blood draws as indicated to confirm clinic improvement.

## 2024-05-17 NOTE — Progress Notes (Signed)
 Pharmacy Antibiotic Note  Trevor Kelley is a 74 y.o. male admitted on 05/17/2024 with severe, progressive left knee pain and concern for septic arthritis.  Pharmacy has been consulted for vancomycin  dosing.Patient also on Ceftriaxone .   Vancomycin  2000mg  given in ED at 17:52 PM.  Cultures pending.  WBC, ANC, Sed Rate, and CRP elevated. Lactic acid is within normal limits.  Currently afebrile.  S/p arthrocentesis - suggestive of septic arthritis with >60,000 WBC, 92% neutrophils - also with monosodium urate crystals suggestive of gout  Noted CKD-IV with current serum Creatinine elevated at 3.01 (baseline ~ 2.7-2.9).   Plan: Continue Vancomycin  at 1250mg  IV every 48 hours. (AUC 472).  Monitor SCr and UOP.  May need dosing based on levels.   Height: 6' (182.9 cm) Weight: 111.6 kg (246 lb 0.5 oz) IBW/kg (Calculated) : 77.6  Temp (24hrs), Avg:98.5 F (36.9 C), Min:98.3 F (36.8 C), Max:98.7 F (37.1 C)  Recent Labs  Lab 05/17/24 1003 05/17/24 1346 05/17/24 1524  WBC 14.3*  --   --   CREATININE 3.01*  --   --   LATICACIDVEN  --  1.3 1.3    Estimated Creatinine Clearance: 27.4 mL/min (A) (by C-G formula based on SCr of 3.01 mg/dL (H)).    Allergies[1]  Antimicrobials this admission: Ceftriaxone  1/8 >> Vancomycin  1/8 >>  Dose adjustments this admission:   Microbiology results: 1/8 BCx:  1/8 Synovial knee (left):   Thank you for allowing pharmacy to be a part of this patients care.  Harlene Boga, PharmD Please refer to [facility] for [facility] Pharmacy numbers 05/17/2024 6:49 PM     [1] No Known Allergies

## 2024-05-17 NOTE — H&P (Signed)
 " History and Physical    OAKLAND FANT FMW:[id number] DOB: 1950/08/12 DOA: 05/17/2024  PCP: Theophilus Andrews, Tully GRADE, MD  Patient coming from: home  I have personally briefly reviewed patient's old medical records in [facility]  Chief Complaint: L knee pain, fall  HPI: Trevor Kelley is Trevor Kelley 74 y.o. male with medical history significant of panhypopituitarism, pituitary adenoma s/p craniotomy, T2DM, CKD IV, seizure disorder and other medical issues here with L knee pain.  He typically walks without assistive device and without issue.  On Sunday, he noticed he could hardly walk due to L knee pain.  He was ok after Trevor Kelley few steps, things got better.  Over the next few days, he had progressive pain and swelling to the point where he couldn't put any pressure on the leg.  He decided to come to the hospital after falling this AM.  Denies fevers, chills, cough, cold, chest pain, SOB, nausea, vomiting, abdominal pain, numbness.  Denies smoking or drinking.  No hx surgical procedures to the knee, he had injury in high school.   ED Course: labs, imaging, arthrocentesis, abx.    Review of Systems: As per HPI otherwise all other systems reviewed and are negative.  Past Medical History:  Diagnosis Date   CKD (chronic kidney disease)    Diabetes mellitus without complication (HCC)    DM (diabetes mellitus), type 2 (HCC)    History of cerebral meningioma    History of pituitary adenoma    Hypertension    Hypothyroidism    OSA (obstructive sleep apnea)    Panhypopituitarism    Seizure disorder (HCC) 05/31/2017    Past Surgical History:  Procedure Laterality Date   BRAIN SURGERY      Social History  reports that he has never smoked. He has never used smokeless tobacco. He reports that he does not drink alcohol and does not use drugs.  Allergies[1]  Family History  Problem Relation Age of Onset   Cancer - Colon Mother    Hypothyroidism Other    Diabetes Mellitus II Other     Cancer Other    Hypertension Other      Prior to Admission medications  Medication Sig Start Date End Date Taking? Authorizing Provider  amLODipine  (NORVASC ) 10 MG tablet Take 10 mg by mouth daily.   Yes [provider]  calcitRIOL  (ROCALTROL ) 0.25 MCG capsule Take 0.25 mcg by mouth daily.   Yes [provider]  Finerenone (KERENDIA) 10 MG TABS Take by mouth.   Yes [provider]  hydrocortisone  (CORTEF ) 10 MG tablet Take 10-15 mg by mouth See admin instructions. Take 15mg  by mouth every morning and take 10mg  by mouth tablet in the evening. 02/25/17  Yes [provider]  insulin  degludec (TRESIBA ) 100 UNIT/ML FlexTouch Pen Inject 12 Units into the skin daily. 06/06/22  Yes Trevor Sharper, MD  latanoprost  (XALATAN ) 0.005 % ophthalmic solution Place 1 drop into both eyes 2 (two) times daily. 01/07/20  Yes [provider]  levETIRAcetam  (KEPPRA ) 500 MG tablet Take 1 tablet (500 mg total) by mouth 2 (two) times daily. 10/05/23  Yes Trevor Lauraine PARAS, NP  levothyroxine  (SYNTHROID , LEVOTHROID) 88 MCG tablet Take 88 mcg by mouth daily before breakfast.   Yes [provider]  olmesartan-hydrochlorothiazide (BENICAR HCT) 20-12.5 MG tablet Take 1 tablet by mouth daily. 03/31/22  Yes [provider]  simvastatin  (ZOCOR ) 20 MG tablet Take 20 mg by mouth daily at 6 PM.  Yes [provider]  tamsulosin  (FLOMAX ) 0.4 MG CAPS capsule Take 0.4 mg by mouth daily.    Yes [provider]  TESTOSTERONE  COMPOUNDING KIT TD Place 1 mL onto the skin every morning.   Yes [provider]  timolol  (TIMOPTIC ) 0.5 % ophthalmic solution Place 1 drop into both eyes daily.  10/26/16  Yes [provider]  VITAMIN D  PO Take 1 tablet by mouth daily.   Yes [provider]    Physical Exam: Vitals:   05/17/24 1242 05/17/24 1259 05/17/24 1405 05/17/24 1701  BP: 92/60 (!) 89/64 120/75   Pulse: 95  88   Resp: 16  18   Temp:  98.7 F (37.1 C)   98.5 F (36.9 C)  TempSrc: Oral   Oral  SpO2: 100%  100%   Weight:      Height:        Constitutional: NAD, calm, comfortable Vitals:   05/17/24 1242 05/17/24 1259 05/17/24 1405 05/17/24 1701  BP: 92/60 (!) 89/64 120/75   Pulse: 95  88   Resp: 16  18   Temp: 98.7 F (37.1 C)   98.5 F (36.9 C)  TempSrc: Oral   Oral  SpO2: 100%  100%   Weight:      Height:       Eyes: PERRL, lids and conjunctivae normal ENMT: Mucous membranes are moist.  Neck: normal, supple Respiratory: clear to auscultation bilaterally, no wheezing, no crackles Cardiovascular: Regular rate and rhythm, no murmurs / rubs / gallops. No extremity edema..  Abdomen: no tenderness, no masses palpated.  Musculoskeletal: L knee swelling, mild ttp Skin: no rashes, lesions, ulcers. No induration Neurologic: CN 2-12 grossly intact. Moving all extremities Psychiatric: Normal judgment and insight. Alert and oriented x 3. Normal mood.   Labs on Admission: I have personally reviewed following labs and imaging studies  CBC: Recent Labs  Lab 05/17/24 1003  WBC 14.3*  NEUTROABS 9.6*  HGB 9.6*  HCT 30.1*  MCV 77.4*  PLT 181    Basic Metabolic Panel: Recent Labs  Lab 05/17/24 1003  NA 137  K 3.3*  CL 102  CO2 23  GLUCOSE 85  BUN 37*  CREATININE 3.01*  CALCIUM 8.9    GFR: Estimated Creatinine Clearance: 27.4 mL/min (Trevor Kelley) (by C-G formula based on SCr of 3.01 mg/dL (H)).  Liver Function Tests: Recent Labs  Lab 05/17/24 1003  AST 31  ALT 16  ALKPHOS 61  BILITOT 1.0  PROT 6.9  ALBUMIN  3.1*    Urine analysis:    Component Value Date/Time   COLORURINE YELLOW 06/05/2022 0854   APPEARANCEUR CLOUDY (Trevor Kelley) 06/05/2022 0854   LABSPEC 1.013 06/05/2022 0854   PHURINE 5.0 06/05/2022 0854   GLUCOSEU >=500 (Trevor Kelley) 06/05/2022 0854   HGBUR NEGATIVE 06/05/2022 0854   BILIRUBINUR NEGATIVE 06/05/2022 0854   KETONESUR NEGATIVE 06/05/2022 0854   PROTEINUR >=300 (Trevor Kelley) 06/05/2022 0854   NITRITE  NEGATIVE 06/05/2022 0854   LEUKOCYTESUR LARGE (Trevor Kelley) 06/05/2022 0854    Radiological Exams on Admission: VAS US  LOWER EXTREMITY VENOUS (DVT) (7a-5p) Result Date: 05/17/2024  Lower Venous DVT Study Patient Name:  Trevor Kelley  Date of Exam:   05/17/2024 Medical Rec #: [id number]            Accession #:    [id number] Date of Birth: 1950/09/05             Patient Gender: M Patient Age:   38 years Exam [facility]  [facility] Procedure:  VAS US  LOWER EXTREMITY VENOUS (DVT) Referring Phys: JAMIE BARRETT --------------------------------------------------------------------------------  Indications: Pain, and Swelling.  Limitations: Poor ultrasound/tissue interface. Comparison Study: Previous exam on 06/23/2020 was negative for DVT Performing Technologist: Ezzie Potters RVT, RDMS  Examination Guidelines: Micala Saltsman complete evaluation includes B-mode imaging, spectral Doppler, color Doppler, and power Doppler as needed of all accessible portions of each vessel. Bilateral testing is considered an integral part of Javaeh Muscatello complete examination. Limited examinations for reoccurring indications may be performed as noted. The reflux portion of the exam is performed with the patient in reverse Trendelenburg.  +-----+---------------+---------+-----------+----------+--------------+ RIGHTCompressibilityPhasicitySpontaneityPropertiesThrombus Aging +-----+---------------+---------+-----------+----------+--------------+ CFV  Full           Yes      Yes                                 +-----+---------------+---------+-----------+----------+--------------+   +---------+---------------+---------+-----------+----------+-------------------+ LEFT     CompressibilityPhasicitySpontaneityPropertiesThrombus Aging      +---------+---------------+---------+-----------+----------+-------------------+ CFV      Full           Yes      Yes                                       +---------+---------------+---------+-----------+----------+-------------------+ SFJ      Full                                                             +---------+---------------+---------+-----------+----------+-------------------+ FV Prox  Full           Yes      Yes                                      +---------+---------------+---------+-----------+----------+-------------------+ FV Mid   Full           Yes      Yes                                      +---------+---------------+---------+-----------+----------+-------------------+ FV DistalFull           Yes      Yes                                      +---------+---------------+---------+-----------+----------+-------------------+ PFV      Full                                                             +---------+---------------+---------+-----------+----------+-------------------+ POP      Full           Yes      Yes                                      +---------+---------------+---------+-----------+----------+-------------------+  PTV      Full                                                             +---------+---------------+---------+-----------+----------+-------------------+ PERO                                                  Not well visualized +---------+---------------+---------+-----------+----------+-------------------+ Soleal   None           No       No                   Acute               +---------+---------------+---------+-----------+----------+-------------------+    Summary: RIGHT: - No evidence of common femoral vein obstruction.   LEFT: Findings consistent with acute intramuscular thrombosis involving the left soleal veins. - There is no evidence of deep vein thrombosis in the lower extremity.  - No cystic structure found in the popliteal fossa. - Ultrasound characteristics of enlarged lymph nodes noted in the groin.  *See table(s) above for measurements and  observations.    Preliminary    DG Ankle Complete Left Result Date: 05/17/2024 CLINICAL DATA:  Fall.  Pain. EXAM: LEFT ANKLE COMPLETE - 3+ VIEW COMPARISON:  No comparison studies available. FINDINGS: No evidence for an acute fracture or dislocation. Ankle mortise is preserved. Tiny fragments adjacent to the tip of the lateral malleolus are consistent with age indeterminate avulsion injury. Soft tissue swelling evident. IMPRESSION: Tiny fragments adjacent to the tip of the lateral malleolus are consistent with age indeterminate avulsion injury. Otherwise no acute bony findings. Electronically Signed   By: Camellia Candle M.D.   On: 05/17/2024 07:31   DG Knee Complete 4 Views Left Result Date: 05/17/2024 CLINICAL DATA:  Fall.  Knee pain. EXAM: LEFT KNEE - COMPLETE 4+ VIEW COMPARISON:  None Available. FINDINGS: No evidence for an acute fracture. No subluxation or dislocation. No worrisome lytic or sclerotic osseous abnormality. Joint effusion evident. IMPRESSION: Joint effusion without acute bony findings. Electronically Signed   By: Camellia Candle M.D.   On: 05/17/2024 07:30    EKG: Independently reviewed. none  Assessment/Plan Principal Problem:   Septic arthritis (HCC)    Assessment and Plan:  Septic Arthritis of Left Knee Gout  Leukocytosis S/p arthrocentesis - suggestive of septic arthritis with >60,000 WBC, 92% neutrophils - also with monosodium urate crystals suggestive of gout Blood cultures pending Synovial fluid culture pending To OR with orthopedics tonight Broad spectrum antibiotics  Will discuss with ID 1/9  Lower Extremity DVT Acute intramuscular thrombosis involving the L soleal veins - appears provoked in setting of septic arthritis above Will need anticoagulation after surgery  Panhypopituitarism  Hx craniotomy for pituitary adenoma Adrenal Insufficiency Central Hypothyroidism Secondary Hypogonadism IV steroids for now, will resume PO steroids 1/10  Continue synthroid   (note TSH unreliable in central hypothyroidism, aiming for free T4 of ~1) Testosterone  on hold for now, follows with Dr. Watt of Urology outpatient Follows with endocrine outpatient   Seizure Disorder Continue keppra   CKD IV Baseline creatinine appears to be around 2.7 Calcitriol    Hypertension Holding amlodipine , kerendia,  olmesartan, hydrochlorothiazide Per wife, lasix  recently discontinued  T2DM SSI Oral meds on hold  Dyslipidemia Statin on hold for now  BPH Flomax   Glaucoma Clarifying eyedrops with pharmacy/med rec  Class 1 Obesity  Body mass index is 33.37 kg/m.  Awaiting final med rec    DVT prophylaxis: SCD for now  Code Status:   full  Family Communication:  Wife at bedside  Disposition Plan:   Patient is from:  home  Anticipated DC to:  Pending improvement  Anticipated DC date:  Pending improvement  Anticipated DC barriers: Pending surgery, ID  Consults called:  ortho  Admission status:  inpatient   Severity of Illness: The appropriate patient status for this patient is INPATIENT. Inpatient status is judged to be reasonable and necessary in order to provide the required intensity of service to ensure the patient's safety. The patient's presenting symptoms, physical exam findings, and initial radiographic and laboratory data in the context of their chronic comorbidities is felt to place them at high risk for further clinical deterioration. Furthermore, it is not anticipated that the patient will be medically stable for discharge from the hospital within 2 midnights of admission.   * I certify that at the point of admission it is my clinical judgment that the patient will require inpatient hospital care spanning beyond 2 midnights from the point of admission due to high intensity of service, high risk for further deterioration and high frequency of surveillance required.DEWAINE Meliton Monte MD [facility]  How to contact the [facility] Attending or  Consulting provider 7A - 7P or covering provider during after hours 7P -7A, for this patient?   Check the care team in [facility] and look for Emanuel Dowson) attending/consulting [facility] provider listed and b) the [facility] team listed Log into [email or web address] and use [place]'s universal password to access. If you do not have the password, please contact the hospital operator. Locate the [facility] provider you are looking for under [facility] and page to Adelaida Reindel number that you can be directly reached. If you still have difficulty reaching the provider, please page the [facility] (Director on Call) for the Hospitalists listed on amion for assistance.  05/17/2024, 6:15 PM        [1] No Known Allergies  "

## 2024-05-17 NOTE — ED Provider Notes (Signed)
 " [place] [facility] AT [facility] Provider Note   CSN: [id number] Arrival date & time: 05/17/24  9374     Patient presents with: Trevor Kelley   Trevor Kelley is a 74 y.o. male.  Patient reports that he has had left leg pain and swelling behind his knee for 3 to 4 days.  Last night when he was trying to ambulate his leg gave out causing him to have to lower himself to the ground. Did not fall or injury himself with this. He reports severe pain to his left knee and left ankle.  He reports that he has had similar episode before.  He denies any fevers, chills or rash.  No chest pain shortness of breath or cough. Denies DVT/PE history. No BT.    Fall       Prior to Admission medications  Medication Sig Start Date End Date Taking? Authorizing Provider  amLODipine  (NORVASC ) 10 MG tablet Take 10 mg by mouth daily.    [provider]  calcitRIOL  (ROCALTROL ) 0.25 MCG capsule Take 0.25 mcg by mouth daily.    [provider]  Finerenone (KERENDIA) 10 MG TABS Take by mouth.    [provider]  hydrocortisone  (CORTEF ) 10 MG tablet Take 10-15 mg by mouth See admin instructions. Take 15mg  by mouth every morning and take 10mg  by mouth tablet in the evening. 02/25/17   [provider]  insulin  degludec (TRESIBA ) 100 UNIT/ML FlexTouch Pen Inject 12 Units into the skin daily. Patient taking differently: Inject 8 Units into the skin daily. 06/06/22   Norrine Sharper, MD  latanoprost  (XALATAN ) 0.005 % ophthalmic solution Place 1 drop into both eyes 2 (two) times daily. 01/07/20   [provider]  levETIRAcetam  (KEPPRA ) 500 MG tablet Take 1 tablet (500 mg total) by mouth 2 (two) times daily. 10/05/23   Gayland Lauraine PARAS, NP  levothyroxine  (SYNTHROID , LEVOTHROID) 88 MCG tablet Take 88 mcg by mouth daily before breakfast.    [provider]  olmesartan-hydrochlorothiazide (BENICAR HCT) 20-12.5 MG tablet Take 1 tablet by mouth daily.  03/31/22   [provider]  simvastatin  (ZOCOR ) 20 MG tablet Take 20 mg by mouth daily at 6 PM.     [provider]  tamsulosin  (FLOMAX ) 0.4 MG CAPS capsule Take 0.4 mg by mouth daily.     [provider]  TESTOSTERONE  COMPOUNDING KIT TD Place 1 mL onto the skin every morning.    [provider]  timolol  (TIMOPTIC ) 0.5 % ophthalmic solution Place 1 drop into both eyes daily.  10/26/16   [provider]  VITAMIN D  PO Take 1 tablet by mouth daily.    [provider]    Allergies: Patient has no known allergies.    Review of Systems  Musculoskeletal:  Positive for arthralgias.    Updated Vital Signs BP 120/75 (BP Location: Left Arm)   Pulse 88   Temp 98.7 F (37.1 C) (Oral)   Resp 18   Ht 6' (1.829 m)   Wt 111.6 kg   SpO2 100%   BMI 33.37 kg/m   Physical Exam Vitals and nursing note reviewed.  Constitutional:      General: He is not in acute distress.    Appearance: He is not toxic-appearing.  HENT:     Head: Normocephalic and atraumatic.  Eyes:     General: No scleral icterus.    Conjunctiva/sclera: Conjunctivae normal.  Cardiovascular:     Rate and Rhythm: Normal rate and  regular rhythm.     Pulses: Normal pulses.     Heart sounds: Normal heart sounds.  Pulmonary:     Effort: Pulmonary effort is normal. No respiratory distress.     Breath sounds: Normal breath sounds.  Abdominal:     General: Abdomen is flat. Bowel sounds are normal.     Palpations: Abdomen is soft.     Tenderness: There is no abdominal tenderness.  Musculoskeletal:     Right lower leg: No edema.     Left lower leg: Edema present.     Comments: Soft compartments, no erythema. Swelling to left lower leg. Neurovac intact.   Skin:    General: Skin is warm and dry.     Findings: No lesion.  Neurological:     General: No focal deficit present.     Mental Status: He is alert and oriented to person, place, and time. Mental status is at baseline.      (all labs ordered are listed, but only abnormal results are displayed) Labs Reviewed  CBC WITH DIFFERENTIAL/PLATELET - Abnormal; Notable for the following components:      Result Value   WBC 14.3 (*)    RBC 3.89 (*)    Hemoglobin 9.6 (*)    HCT 30.1 (*)    MCV 77.4 (*)    MCH 24.7 (*)    RDW 16.8 (*)    Neutro Abs 9.6 (*)    Monocytes Absolute 1.8 (*)    Abs Immature Granulocytes 0.12 (*)    All other components within normal limits  COMPREHENSIVE METABOLIC PANEL WITH GFR - Abnormal; Notable for the following components:   Potassium 3.3 (*)    BUN 37 (*)    Creatinine, Ser 3.01 (*)    Albumin  3.1 (*)    GFR, Estimated 21 (*)    All other components within normal limits  C-REACTIVE PROTEIN - Abnormal; Notable for the following components:   CRP 29.0 (*)    All other components within normal limits  BODY FLUID CULTURE W GRAM STAIN  CULTURE, BLOOD (ROUTINE X 2)  CULTURE, BLOOD (ROUTINE X 2)  SEDIMENTATION RATE  SYNOVIAL CELL COUNT + DIFF, W/ CRYSTALS  I-STAT CG4 LACTIC ACID, ED  I-STAT CG4 LACTIC ACID, ED    EKG: None  Radiology: VAS US  LOWER EXTREMITY VENOUS (DVT) (7a-5p) Result Date: 05/17/2024  Lower Venous DVT Study Patient Name:  Trevor Kelley  Date of Exam:   05/17/2024 Medical Rec #: [id number]            Accession #:    [id number] Date of Birth: 1950/11/17             Patient Gender: M Patient Age:   28 years Exam Location:  [facility] Procedure:      VAS US  LOWER EXTREMITY VENOUS (DVT) Referring Phys: WARREN Kerrilyn Azbill --------------------------------------------------------------------------------  Indications: Pain, and Swelling.  Limitations: Poor ultrasound/tissue interface. Comparison Study: Previous exam on 06/23/2020 was negative for DVT Performing Technologist: Ezzie Potters RVT, RDMS  Examination Guidelines: A complete evaluation includes B-mode imaging, spectral Doppler, color Doppler, and power Doppler as needed of all accessible portions of each  vessel. Bilateral testing is considered an integral part of a complete examination. Limited examinations for reoccurring indications may be performed as noted. The reflux portion of the exam is performed with the patient in reverse Trendelenburg.  +-----+---------------+---------+-----------+----------+--------------+ RIGHTCompressibilityPhasicitySpontaneityPropertiesThrombus Aging +-----+---------------+---------+-----------+----------+--------------+ CFV  Full           Yes  Yes                                 +-----+---------------+---------+-----------+----------+--------------+   +---------+---------------+---------+-----------+----------+-------------------+ LEFT     CompressibilityPhasicitySpontaneityPropertiesThrombus Aging      +---------+---------------+---------+-----------+----------+-------------------+ CFV      Full           Yes      Yes                                      +---------+---------------+---------+-----------+----------+-------------------+ SFJ      Full                                                             +---------+---------------+---------+-----------+----------+-------------------+ FV Prox  Full           Yes      Yes                                      +---------+---------------+---------+-----------+----------+-------------------+ FV Mid   Full           Yes      Yes                                      +---------+---------------+---------+-----------+----------+-------------------+ FV DistalFull           Yes      Yes                                      +---------+---------------+---------+-----------+----------+-------------------+ PFV      Full                                                             +---------+---------------+---------+-----------+----------+-------------------+ POP      Full           Yes      Yes                                       +---------+---------------+---------+-----------+----------+-------------------+ PTV      Full                                                             +---------+---------------+---------+-----------+----------+-------------------+ PERO  Not well visualized +---------+---------------+---------+-----------+----------+-------------------+ Soleal   None           No       No                   Acute               +---------+---------------+---------+-----------+----------+-------------------+    Summary: RIGHT: - No evidence of common femoral vein obstruction.   LEFT: Findings consistent with acute intramuscular thrombosis involving the left soleal veins. - There is no evidence of deep vein thrombosis in the lower extremity.  - No cystic structure found in the popliteal fossa. - Ultrasound characteristics of enlarged lymph nodes noted in the groin.  *See table(s) above for measurements and observations.    Preliminary    DG Ankle Complete Left Result Date: 05/17/2024 CLINICAL DATA:  Fall.  Pain. EXAM: LEFT ANKLE COMPLETE - 3+ VIEW COMPARISON:  No comparison studies available. FINDINGS: No evidence for an acute fracture or dislocation. Ankle mortise is preserved. Tiny fragments adjacent to the tip of the lateral malleolus are consistent with age indeterminate avulsion injury. Soft tissue swelling evident. IMPRESSION: Tiny fragments adjacent to the tip of the lateral malleolus are consistent with age indeterminate avulsion injury. Otherwise no acute bony findings. Electronically Signed   By: Camellia Candle M.D.   On: 05/17/2024 07:31   DG Knee Complete 4 Views Left Result Date: 05/17/2024 CLINICAL DATA:  Fall.  Knee pain. EXAM: LEFT KNEE - COMPLETE 4+ VIEW COMPARISON:  None Available. FINDINGS: No evidence for an acute fracture. No subluxation or dislocation. No worrisome lytic or sclerotic osseous abnormality. Joint effusion evident. IMPRESSION:  Joint effusion without acute bony findings. Electronically Signed   By: Camellia Candle M.D.   On: 05/17/2024 07:30     .Joint Aspiration/Arthrocentesis  Date/Time: 05/17/2024 12:58 PM  Performed by: Shermon Warren SAILOR, PA-C Authorized by: Shermon Warren SAILOR, PA-C   Consent:    Consent obtained:  Verbal   Consent given by:  Patient   Risks, benefits, and alternatives were discussed: yes     Risks discussed:  Bleeding, incomplete drainage, infection, nerve damage, poor cosmetic result and pain   Alternatives discussed:  No treatment, delayed treatment, alternative treatment, observation and referral Universal protocol:    Procedure explained and questions answered to patient or proxy's satisfaction: yes     Relevant documents present and verified: yes     Test results available: yes     Imaging studies available: yes     Required blood products, implants, devices, and special equipment available: yes     Site/side marked: yes     Immediately prior to procedure, a time out was called: yes     Patient identity confirmed:  Verbally with patient Location:    Location:  Knee Anesthesia:    Anesthesia method:  Local infiltration Procedure details:    Preparation: Patient was prepped and draped in usual sterile fashion     Needle gauge:  18 G   Ultrasound guidance: no     Approach:  Lateral   Aspirate amount:  Unsuccussdul attempt Post-procedure details:    Dressing:  Adhesive bandage   Procedure completion:  Tolerated    Medications Ordered in the ED  hydrocortisone  (CORTEF ) tablet 15 mg (15 mg Oral Given 05/17/24 1230)  HYDROcodone -acetaminophen  (NORCO/VICODIN) 5-325 MG per tablet 1 tablet (1 tablet Oral Given 05/17/24 0732)  lidocaine -EPINEPHrine  (XYLOCAINE  W/EPI) 2 %-1:200000 (PF) injection 10 mL (10 mLs Infiltration Given 05/17/24 1124)  bupivacaine (PF) (MARCAINE ) 0.5 % injection 10 mL (10 mLs Infiltration Given 05/17/24 1232)  sodium chloride  0.9 % bolus 1,000 mL (1,000 mLs Intravenous New  Bag/Given 05/17/24 1323)    Clinical Course as of 05/17/24 1514  Thu May 17, 2024  1509 -ortho recs -f/u synovial fluid [SE]    Clinical Course User Index [SE] Guillermina Hamilton, MD                                 Medical Decision Making Amount and/or Complexity of Data Reviewed Labs: ordered. Radiology: ordered.  Risk Prescription drug management.   This patient presents to the ED for concern of lower leg swelling , this involves an extensive number of treatment options, and is a complaint that carries with it a high risk of complications and morbidity.  The differential diagnosis includes DVT, cellulitis, CHF, fracture, sprain    Co morbidities that complicate the patient evaluation  HTN, OSA, DM, CKD    Additional history obtained:  Additional history obtained from 06/05/22 came to ED for hypokalemia and required admission    Lab Tests:  I personally interpreted labs.  The pertinent results include:   Mild leukocytosis, stable anemia around 9.6 CMP K 3.3, creatinine 3 baseline around 2.9 on prior labs   Imaging Studies ordered:  I ordered imaging studies including x-ray of left knee, ankle. DVT study.   I independently visualized and interpreted imaging which showed no obvious acute fracture, no DVT I agree with the radiologist interpretation   Cardiac Monitoring: / EKG:  The patient was maintained on a cardiac monitor.     Consultations Obtained:  I requested consultation with the ortho,  and discussed lab and imaging findings as well as pertinent plan. Ozell Ned to see patient.    Problem List / ED Course / Critical interventions / Medication management  Patient reporting to ED with complaint of left leg pain and swelling.  Patient reports that this started atraumatically.  On arrival he is hemodynamically stable and well-appearing.  He has strong dorsal pedal pulse that is present equal bilaterally.  He has soft compartments.  He has no obvious  erythema or sign of infection.  He has generalized tenderness over posterior knee, calf and ankle.  He has no sign of deformity.  He has no tenderness over his hip.  He denies any history of DVT or PE.  He is not on a blood thinner.  He denies any chest pain shortness of breath or cough.  He reports that he has had this in the past.  He is unsure what it was.  I will obtain x-ray of his knee and ankle and get DVT study. Attempted arthrocentesis x2, unsuccessful. Discussed with Ortho on next step. Ortho did beside arthrocentesis, waiting on synovial fluid analysis.  I ordered medication including Norco for pain control.  Reevaluation of the patient after these medicines showed that the patient stayed the same I have reviewed the patients home medicines and have made adjustments as needed. Awaiting joint fluid analysis at time of signout.  Signed out to oncoming resident Dr. Guillermina.         Final diagnoses:  Acute pain of left knee    ED Discharge Orders     None          Shermon Warren SAILOR, PA-C 05/17/24 1515    Neysa Caron PARAS, DO 05/17/24 1552  "

## 2024-05-17 NOTE — Transfer of Care (Signed)
 Immediate Anesthesia Transfer of Care Note  Patient: Trevor Kelley  Procedure(s) Performed: ARTHROSCOPY, KNEE WITH DEBRIDEMENT (Left: Knee)  Patient Location: PACU  Anesthesia Type:General  Level of Consciousness: awake, alert , and oriented  Airway & Oxygen Therapy: Patient Spontanous Breathing and Patient connected to nasal cannula oxygen  Post-op Assessment: Report given to RN and Post -op Vital signs reviewed and stable  Post vital signs: Reviewed and stable  Last Vitals:  Vitals Value Taken Time  BP 97/61 05/17/24 20:33  Temp    Pulse 97 05/17/24 20:38  Resp 20 05/17/24 20:38  SpO2 94 % 05/17/24 20:38  Vitals shown include unfiled device data.  Last Pain:  Vitals:   05/17/24 1821  TempSrc: Oral  PainSc: 8       Patients Stated Pain Goal: 3 (05/17/24 1821)  Complications: No notable events documented.

## 2024-05-17 NOTE — Discharge Instructions (Signed)
 Orthopedic surgery discharge instructions:  Bandages: -Maintain postoperative bandage until follow-up appointment.  - Do not use any lotions or creams on or around the incision until instructed by your surgeon.  Showering - Keep bandages clean and dry - Do not submerge the surgical site underwater.    Activity Restrictions Weight bearing as tolerated left lower extremity  Medications - For mild to moderate pain use Tylenol  as needed around-the-clock, 1000 mg every 8 h for pain. Do not take more than this higher levels can affect kidney function - For breakthrough pain use oxycodone  as necessary. - Please take a stool softener such as docusate and senna while taking a narcotic pain medication - Continue antibiotics as prescribed by infectious disease   Follow Up:  -You will return to see Dr. Teresa in the office in 2 weeks for skin check  _______________________________________   Information on my medicine - ELIQUIS  (apixaban )  This medication education was reviewed with me or my healthcare representative as part of my discharge preparation.  The pharmacist that spoke with me during my hospital stay was:  Elma Fail, [facility]  Why was Eliquis  prescribed for you? Eliquis  was prescribed for you to reduce the risk of forming blood clots that can cause a stroke if you have a medical condition called atrial fibrillation (a type of irregular heartbeat) OR to reduce the risk of a blood clots forming after orthopedic surgery.  What do You need to know about Eliquis  ? Take your Eliquis  TWICE DAILY - one tablet in the morning and one tablet in the evening with or without food.  It would be best to take the doses about the same time each day.  If you have difficulty swallowing the tablet whole please discuss with your pharmacist how to take the medication safely.  Take Eliquis  exactly as prescribed by your doctor and DO NOT stop taking Eliquis  without talking to the doctor who  prescribed the medication.  Stopping may increase your risk of developing a new clot or stroke.  Refill your prescription before you run out.  After discharge, you should have regular check-up appointments with your healthcare provider that is prescribing your Eliquis .  In the future your dose may need to be changed if your kidney function or weight changes by a significant amount or as you get older.  What do you do if you miss a dose? If you miss a dose, take it as soon as you remember on the same day and resume taking twice daily.  Do not take more than one dose of ELIQUIS  at the same time.  Important Safety Information A possible side effect of Eliquis  is bleeding. You should call your healthcare provider right away if you experience any of the following: Bleeding from an injury or your nose that does not stop. Unusual colored urine (red or dark brown) or unusual colored stools (red or black). Unusual bruising for unknown reasons. A serious fall or if you hit your head (even if there is no bleeding).  Some medicines may interact with Eliquis  and might increase your risk of bleeding or clotting while on Eliquis . To help avoid this, consult your healthcare provider or pharmacist prior to using any new prescription or non-prescription medications, including herbals, vitamins, non-steroidal anti-inflammatory drugs (NSAIDs) and supplements.  This website has more information on Eliquis  (apixaban ): http://www.eliquis .com/eliquis dena

## 2024-05-17 NOTE — Procedures (Signed)
 Procedure: Left knee aspiration and injection   Indication: Left knee effusion(s)   Surgeon: Ozell Ned, PA-C   Assist: None   Anesthesia: Topical refrigerant   EBL: None   Complications: None   Findings: After risks/benefits explained patient desires to undergo procedure. Consent obtained and time out performed. The left knee was sterilely prepped and aspirated. 67ml cloudy yellow fluid obtained. 6ml 0.5% Marcaine  instilled. Pt tolerated the procedure well.       Ozell DOROTHA Ned, PA-C Orthopedic Surgery [phone]

## 2024-05-17 NOTE — Consult Note (Addendum)
 Attending Surgeon Addendum: 74 year old male with 5 day history of left knee pain who was seen and examined by Trevor Kelley, Orthopedic PA-C who performed a left knee aspiration today which demonstrated 64,500 WBC with 92% neutrophils. I have discussed the case at length with Trevor and have personally seen and examined the patient.   The patient  presented to the emergency department today after a fall when his knee gave out of him.  He localized all of his pain to the left knee without radiation of the pain. He reports mild pain also in his left ankle. The pain is worse with activity and better with rest. He reports that he can no longer bear weight.  He has has had no fevers or chills and no erythema of the knee.  He has had a history of similar knee pain in the past with aspirations, but has never been found to have septic arthritis. The patient is a diabetic with last A1C in October at 6.6.  He has CKD, hypertension history of meningioma s/p resection, HTN, Hypothyroidism and a seizure disorder.  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 He does not use tobacco products, drink alcohol or use recreational drugs.  He has no associated numbness or tingling of the left lower extremity. He has been NPO for 8 hours except for clear liquids  Physical exam: - skin intact without erythema, swelling present about the knee - large knee effusion - tender to the medial and lateral joints lines - Knee ROM 0-70 deg flexion/extension with micromotion tenderness - Stable varus/valgus stress, lachman and posterior drawer - mild pain with extremes of motion of the ankle, but no micromotion tenderness - no tenderness to palpation of the ankle - Sensation intact to light touch sural, saphenous, tibial, deep and superficial peroneal nerve distribution. - 2+ DP pulse  05/17/24 ESR: 79 05/17/24: CRP: 29  Assessment and plan: 74 year old male with 5 day history of left knee pain with evidence on physical  exam and analysis of left knee aspiration of septic arthritis.  The patient is indicated to undergo arthroscopic irrigation and debridement with synovectomy to treat his left knee septic arthritis.  Discussion was had with the patient and his wife regarding his condition and treatment options.  They were advised that he has a high risk of damage to his cartilage and other intra articular structures of the knee due to the presence of bacteria, but I&D is the best way to prevent further damage.  Risks, benefits and alternatives were discussed. Risks discussed include, but are not limited to persistent infection requiring multiple procedures, bleeding, damage to surrounding structures such as nerves arteries and veins, persistent knee stiffness and disability and risks with anesthesia.  He was given rocephin  in the emergency department, but we will hold further antibiotic until cultures are obtained in the OR.  He will then be placed on IV antibiotics with an ID consult to assist with antibiotic management.  The patient and his wife understand and wish to proceed. Informed consent obtained.  We will follow q2 day ESR and CRP post op along with physical exam to ensure infection resolution.  Informed consent obtained Proceed with left knee arthroscopic irrigation and debridement with synovectomy Hold antibiotics until cultures obtained NPO Clearance per medicine Q2 day ESR and CRP serially along with serial exams ID consult post op Dispo: pending OR  Reason for Consult:Left knee effusion Referring Physician: Caron Kelley Time called: 1156 Time at  bedside: 1210   Trevor Kelley is an 74 y.o. male.  HPI: Trevor Kelley comes to the ED for evaluation of left knee and, to a lesser extent, left ankle pain since Sunday. He denies any antecedent event and noted it just started hurting when he stood up at church. He thinks the pain has been pretty steady over the past week though over the last 2d he's progressed  to being unable to get up or bear weight on it. He normally does not use any assistive devices to ambulate but has been using a RW this week. He had an e/o similar 3y ago when the right knee needed draining but did not result in any surgery. He denies fevers, chills, sweats, N/V, or hx/o gout.  Past Medical History:  Diagnosis Date   CKD (chronic kidney disease)    Diabetes mellitus without complication (HCC)    DM (diabetes mellitus), type 2 (HCC)    History of cerebral meningioma    History of pituitary adenoma    Hypertension    Hypothyroidism    OSA (obstructive sleep apnea)    Panhypopituitarism    Seizure disorder (HCC) 05/31/2017    Past Surgical History:  Procedure Laterality Date   BRAIN SURGERY      Family History  Problem Relation Age of Onset   Cancer - Colon Mother    Hypothyroidism Other    Diabetes Mellitus II Other    Cancer Other    Hypertension Other     Social History:  reports that he has never smoked. He has never used smokeless tobacco. He reports that he does not drink alcohol and does not use drugs.  Allergies: Allergies[1]  Medications: I have reviewed the patient's current medications.  Results for orders placed or performed during the hospital encounter of 05/17/24 (from the past 48 hours)  CBC with Differential     Status: Abnormal   Collection Time: 05/17/24 10:03 AM  Result Value Ref Range   WBC 14.3 (H) 4.0 - 10.5 K/uL   RBC 3.89 (L) 4.22 - 5.81 MIL/uL   Hemoglobin 9.6 (L) 13.0 - 17.0 g/dL   HCT 69.8 (L) 60.9 - 47.9 %   MCV 77.4 (L) 80.0 - 100.0 fL   MCH 24.7 (L) 26.0 - 34.0 pg   MCHC 31.9 30.0 - 36.0 g/dL   RDW 83.1 (H) 88.4 - 84.4 %   Platelets 181 150 - 400 K/uL   nRBC 0.0 0.0 - 0.2 %   Neutrophils Relative % 66 %   Neutro Abs 9.6 (H) 1.7 - 7.7 K/uL   Lymphocytes Relative 19 %   Lymphs Abs 2.7 0.7 - 4.0 K/uL   Monocytes Relative 13 %   Monocytes Absolute 1.8 (H) 0.1 - 1.0 K/uL   Eosinophils Relative 1 %   Eosinophils Absolute  0.1 0.0 - 0.5 K/uL   Basophils Relative 0 %   Basophils Absolute 0.1 0.0 - 0.1 K/uL   Immature Granulocytes 1 %   Abs Immature Granulocytes 0.12 (H) 0.00 - 0.07 K/uL    Comment: Performed at [facility], [address], [place], [place] [address]  Comprehensive metabolic panel     Status: Abnormal   Collection Time: 05/17/24 10:03 AM  Result Value Ref Range   Sodium 137 135 - 145 mmol/L   Potassium 3.3 (L) 3.5 - 5.1 mmol/L   Chloride 102 98 - 111 mmol/L   CO2 23 22 - 32 mmol/L   Glucose, Bld 85 70 -  99 mg/dL    Comment: Glucose reference range applies only to samples taken after fasting for at least 8 hours.   BUN 37 (H) 8 - 23 mg/dL   Creatinine, Ser 6.98 (H) 0.61 - 1.24 mg/dL   Calcium 8.9 8.9 - 89.6 mg/dL   Total Protein 6.9 6.5 - 8.1 g/dL   Albumin  3.1 (L) 3.5 - 5.0 g/dL   AST 31 15 - 41 U/L   ALT 16 0 - 44 U/L   Alkaline Phosphatase 61 38 - 126 U/L   Total Bilirubin 1.0 0.0 - 1.2 mg/dL   GFR, Estimated 21 (L) >60 mL/min    Comment: (NOTE) Calculated using the CKD-EPI Creatinine Equation (2021)    Anion gap 13 5 - 15    Comment: Performed at [facility], [address], [place], [place] [address]    VAS US  LOWER EXTREMITY VENOUS (DVT) (7a-5p) Result Date: 05/17/2024  Lower Venous DVT Study Patient Name:  Trevor Kelley  Date of Exam:   05/17/2024 Medical Rec #: [id number]            Accession #:    [id number] Date of Birth: Jul 27, 1950             Patient Gender: M Patient Age:   16 years Exam Location:  [facility] Procedure:      VAS US  LOWER EXTREMITY VENOUS (DVT) Referring Phys: WARREN BARRETT --------------------------------------------------------------------------------  Indications: Pain, and Swelling.  Limitations: Poor ultrasound/tissue interface. Comparison Study: Previous exam on 06/23/2020 was negative for DVT Performing Technologist: Ezzie Potters RVT, RDMS  Examination Guidelines: A complete evaluation includes B-mode imaging, spectral Doppler, color  Doppler, and power Doppler as needed of all accessible portions of each vessel. Bilateral testing is considered an integral part of a complete examination. Limited examinations for reoccurring indications may be performed as noted. The reflux portion of the exam is performed with the patient in reverse Trendelenburg.  +-----+---------------+---------+-----------+----------+--------------+ RIGHTCompressibilityPhasicitySpontaneityPropertiesThrombus Aging +-----+---------------+---------+-----------+----------+--------------+ CFV  Full           Yes      Yes                                 +-----+---------------+---------+-----------+----------+--------------+   +---------+---------------+---------+-----------+----------+-------------------+ LEFT     CompressibilityPhasicitySpontaneityPropertiesThrombus Aging      +---------+---------------+---------+-----------+----------+-------------------+ CFV      Full           Yes      Yes                                      +---------+---------------+---------+-----------+----------+-------------------+ SFJ      Full                                                             +---------+---------------+---------+-----------+----------+-------------------+ FV Prox  Full           Yes      Yes                                      +---------+---------------+---------+-----------+----------+-------------------+ FV Mid  Full           Yes      Yes                                      +---------+---------------+---------+-----------+----------+-------------------+ FV DistalFull           Yes      Yes                                      +---------+---------------+---------+-----------+----------+-------------------+ PFV      Full                                                             +---------+---------------+---------+-----------+----------+-------------------+ POP      Full           Yes      Yes                                       +---------+---------------+---------+-----------+----------+-------------------+ PTV      Full                                                             +---------+---------------+---------+-----------+----------+-------------------+ PERO                                                  Not well visualized +---------+---------------+---------+-----------+----------+-------------------+ Soleal   None           No       No                   Acute               +---------+---------------+---------+-----------+----------+-------------------+    Summary: RIGHT: - No evidence of common femoral vein obstruction.   LEFT: Findings consistent with acute intramuscular thrombosis involving the left soleal veins. - There is no evidence of deep vein thrombosis in the lower extremity.  - No cystic structure found in the popliteal fossa. - Ultrasound characteristics of enlarged lymph nodes noted in the groin.  *See table(s) above for measurements and observations.    Preliminary    DG Ankle Complete Left Result Date: 05/17/2024 CLINICAL DATA:  Fall.  Pain. EXAM: LEFT ANKLE COMPLETE - 3+ VIEW COMPARISON:  No comparison studies available. FINDINGS: No evidence for an acute fracture or dislocation. Ankle mortise is preserved. Tiny fragments adjacent to the tip of the lateral malleolus are consistent with age indeterminate avulsion injury. Soft tissue swelling evident. IMPRESSION: Tiny fragments adjacent to the tip of the lateral malleolus are consistent with age indeterminate avulsion injury. Otherwise no acute bony findings. Electronically Signed   By: Camellia Candle M.D.   On: 05/17/2024 07:31   DG Knee Complete  4 Views Left Result Date: 05/17/2024 CLINICAL DATA:  Fall.  Knee pain. EXAM: LEFT KNEE - COMPLETE 4+ VIEW COMPARISON:  None Available. FINDINGS: No evidence for an acute fracture. No subluxation or dislocation. No worrisome lytic or sclerotic osseous abnormality. Joint  effusion evident. IMPRESSION: Joint effusion without acute bony findings. Electronically Signed   By: Camellia Candle M.D.   On: 05/17/2024 07:30    Review of Systems  Constitutional:  Negative for chills, diaphoresis and fever.  HENT:  Negative for ear discharge, ear pain, hearing loss and tinnitus.   Eyes:  Negative for photophobia and pain.  Respiratory:  Negative for cough and shortness of breath.   Cardiovascular:  Negative for chest pain.  Gastrointestinal:  Negative for abdominal pain, nausea and vomiting.  Genitourinary:  Negative for dysuria, flank pain, frequency and urgency.  Musculoskeletal:  Positive for arthralgias (Left knee>left ankle). Negative for back pain, myalgias and neck pain.  Neurological:  Negative for dizziness and headaches.  Hematological:  Does not bruise/bleed easily.  Psychiatric/Behavioral:  The patient is not nervous/anxious.    Blood pressure 92/67, pulse (!) 105, temperature 98.5 F (36.9 C), temperature source Oral, resp. rate 14, height 6' (1.829 m), weight 111.6 kg, SpO2 100%. Physical Exam Constitutional:      General: He is not in acute distress.    Appearance: He is well-developed. He is not diaphoretic.  HENT:     Head: Normocephalic and atraumatic.  Eyes:     General: No scleral icterus.       Right eye: No discharge.        Left eye: No discharge.     Conjunctiva/sclera: Conjunctivae normal.  Cardiovascular:     Rate and Rhythm: Normal rate and regular rhythm.  Pulmonary:     Effort: Pulmonary effort is normal. No respiratory distress.  Musculoskeletal:     Cervical back: Normal range of motion.     Comments: LLE No traumatic wounds, ecchymosis, or rash  Mod TTP knee, mod pain with knee PROM, mild pain with ankle PROM, unable to AROM knee, minimal pain with AROM ankle  Large knee effusion  Sens DPN, SPN, TN intact  Motor EHL, ext, flex, evers 5/5  DP 2+, PT 2+, No significant edema  Skin:    General: Skin is warm and dry.   Neurological:     Mental Status: He is alert.  Psychiatric:        Mood and Affect: Mood normal.        Behavior: Behavior normal.     Assessment/Plan: Left knee effusion -- Have tapped and will send for culture and analysis. Please keep NPO.    Trevor DOROTHA Ned, PA-C Orthopedic Surgery [phone] 05/17/2024, 12:40 PM      [1] No Known Allergies

## 2024-05-17 NOTE — Progress Notes (Signed)
 LLE venous duplex has been completed.  Preliminary results given to Dr. Neysa.   Results can be found under chart review under CV PROC. 05/17/2024 9:51 AM Kinsie Belford RVT, RDMS

## 2024-05-17 NOTE — ED Notes (Signed)
 Pt transported to vascular.

## 2024-05-17 NOTE — OR Nursing (Signed)
Care of patient assumed at 1910.

## 2024-05-17 NOTE — Anesthesia Preprocedure Evaluation (Addendum)
 "                                  Anesthesia Evaluation  Patient identified by MRN, date of birth, ID band Patient awake    Reviewed: Allergy & Precautions, NPO status , Patient's Chart, lab work & pertinent test results  History of Anesthesia Complications Negative for: history of anesthetic complications  Airway Mallampati: III  TM Distance: >3 FB Neck ROM: Limited   Comment: Previous grade IIa view with MAC 3, easy mask Dental  (+) Dental Advisory Given   Pulmonary neg shortness of breath, sleep apnea and Continuous Positive Airway Pressure Ventilation , neg COPD, neg recent URI   Pulmonary exam normal breath sounds clear to auscultation       Cardiovascular hypertension (amlodipine , olmesartan-HCTZ), Pt. on medications (-) angina (-) Past MI, (-) Cardiac Stents and (-) CABG + dysrhythmias (prolonged QT)  Rhythm:Regular Rate:Normal  Study Conclusions   - Left ventricle: The cavity size was mildly dilated. There was    moderate concentric hypertrophy. Systolic function was normal.    The estimated ejection fraction was in the range of 60% to 65%.    Wall motion was normal; there were no regional wall motion    abnormalities. Left ventricular diastolic function parameters    were normal.  - Aorta: Ascending aortic diameter: 39 mm (S).  - Ascending aorta: The ascending aorta was mildly dilated.  - Mitral valve: There was trivial regurgitation.  - Left atrium: The atrium was mildly dilated.  - Tricuspid valve: There was trivial regurgitation.  - Pulmonic valve: There was trivial regurgitation.     Neuro/Psych Seizures - (x1 years ago), Well Controlled,     GI/Hepatic negative GI ROS, Neg liver ROS,,,  Endo/Other  diabetes, Type 2, Insulin  DependentHypothyroidism  H/o pituitary adenoma, panhypopituitarism, secondary adrenal insufficiency - on hydrocortisone  (received at 12:30 pm today)  Renal/GU Renal disease     Musculoskeletal  (+) Arthritis ,  Septic  arthritis   Abdominal  (+) + obese  Peds  Hematology  (+) Blood dyscrasia, anemia Lab Results      Component                Value               Date                      WBC                      14.3 (H)            05/17/2024                HGB                      9.6 (L)             05/17/2024                HCT                      30.1 (L)            05/17/2024                MCV  77.4 (L)            05/17/2024                PLT                      181                 05/17/2024              Anesthesia Other Findings   Reproductive/Obstetrics                              Anesthesia Physical Anesthesia Plan  ASA: 3  Anesthesia Plan: General   Post-op Pain Management:    Induction: Intravenous  PONV Risk Score and Plan: 2 and Ondansetron  and Dexamethasone   Airway Management Planned: Oral ETT and LMA  Additional Equipment:   Intra-op Plan:   Post-operative Plan: Extubation in OR  Informed Consent: I have reviewed the patients History and Physical, chart, labs and discussed the procedure including the risks, benefits and alternatives for the proposed anesthesia with the patient or authorized representative who has indicated his/her understanding and acceptance.     Dental advisory given  Plan Discussed with: CRNA and Anesthesiologist  Anesthesia Plan Comments: (Risks of general anesthesia discussed including, but not limited to, sore throat, hoarse voice, chipped/damaged teeth, injury to vocal cords, nausea and vomiting, allergic reactions, lung infection, heart attack, stroke, and death. All questions answered. )         Anesthesia Quick Evaluation  "

## 2024-05-17 NOTE — Anesthesia Procedure Notes (Signed)
 Procedure Name: Intubation Date/Time: 05/17/2024 7:00 PM  Performed by: Zelphia Norleen HERO, CRNAPre-anesthesia Checklist: Patient identified, Emergency Drugs available, Suction available and Patient being monitored Patient Re-evaluated:Patient Re-evaluated prior to induction Oxygen Delivery Method: Circle system utilized Preoxygenation: Pre-oxygenation with 100% oxygen Induction Type: IV induction Ventilation: Mask ventilation without difficulty Laryngoscope Size: Mac, 3 and Glidescope Tube type: Oral Tube size: 7.0 mm Number of attempts: 2 Airway Equipment and Method: Stylet and Oral airway Placement Confirmation: ETT inserted through vocal cords under direct vision, positive ETCO2 and breath sounds checked- equal and bilateral Secured at: 24 cm Tube secured with: Tape Dental Injury: Teeth and Oropharynx as per pre-operative assessment  Comments: Attempt 1 mac4 grade 3 unsuccessful attempt Attempt 2 mac 3 glidescope grade 1 view successful intubation

## 2024-05-17 NOTE — ED Notes (Signed)
 Patient transported to OR.

## 2024-05-17 NOTE — ED Notes (Signed)
 Phlebotomy to collect outstanding labs.

## 2024-05-17 NOTE — ED Provider Notes (Signed)
 Assume Care - Medical Decision Making  Care of patient assumed from previous emergency medicine provider at 1500. See their note for further details of history, physical exam and plan.  Briefly, Trevor Kelley is a 74 y.o. male who presents with: Joint effusion and pain.  Clinical Course as of 05/17/24 1610  Thu May 17, 2024  1509 -ortho recs -f/u synovial fluid [SE]    Clinical Course User Index [SE] Guillermina Hamilton, MD     Reassessment: I personally reassessed the patient:  Vital Signs:  The most current vitals were  Vitals:   05/17/24 1013 05/17/24 1242 05/17/24 1259 05/17/24 1405  BP: 92/67 92/60 (!) 89/64 120/75  Pulse: (!) 105 95  88  Resp: 14 16  18   Temp: 98.5 F (36.9 C) 98.7 F (37.1 C)    TempSrc: Oral Oral    SpO2: 100% 100%  100%  Weight:      Height:       Hemodynamics:  The patient is hemodynamically stable. Mental Status:  The patient is alert  Additional MDM/ED Course: Patient hemodynamically stable at the time of handoff.  Patient handed off pending synovial cell count.  Cell count resulted with 64,500 WBCs.  Consistent with septic arthritis given effusion, elevated inflammatory markers, white blood cells, and knee pain.  Will touch base with orthopedic surgery.    Spoke with orthopedics.  They plan to take patient for washout tonight.  Will admit to medicine for observation.  Patient given Vanco and Rocephin  in the ED.  Disposition:  I discussed the case with Dr.Powell who graciously agreed to admit the patient to their service for continued care.   Clinical Impression:  1. Acute pain of left knee    The plan for this patient was discussed with Dr. Patt, who voiced agreement and who oversaw evaluation and treatment of this patient.   Electronically signed by:   Hamilton Carlin Guillermina, M.D. PGY-2, Emergency Medicine      Guillermina Hamilton, MD 05/17/24 1715    Patt Alm Macho, MD 05/17/24 2328

## 2024-05-18 ENCOUNTER — Encounter (HOSPITAL_COMMUNITY): Payer: Self-pay

## 2024-05-18 DIAGNOSIS — M00869 Arthritis due to other bacteria, unspecified knee: Secondary | ICD-10-CM | POA: Diagnosis not present

## 2024-05-18 DIAGNOSIS — M109 Gout, unspecified: Secondary | ICD-10-CM | POA: Diagnosis not present

## 2024-05-18 DIAGNOSIS — M1310 Monoarthritis, not elsewhere classified, unspecified site: Secondary | ICD-10-CM

## 2024-05-18 DIAGNOSIS — M25562 Pain in left knee: Secondary | ICD-10-CM | POA: Diagnosis not present

## 2024-05-18 LAB — CBC
HCT: 28.8 % — ABNORMAL LOW (ref 39.0–52.0)
Hemoglobin: 9.3 g/dL — ABNORMAL LOW (ref 13.0–17.0)
MCH: 24.3 pg — ABNORMAL LOW (ref 26.0–34.0)
MCHC: 32.3 g/dL (ref 30.0–36.0)
MCV: 75.4 fL — ABNORMAL LOW (ref 80.0–100.0)
Platelets: 194 K/uL (ref 150–400)
RBC: 3.82 MIL/uL — ABNORMAL LOW (ref 4.22–5.81)
RDW: 16.8 % — ABNORMAL HIGH (ref 11.5–15.5)
WBC: 11.8 K/uL — ABNORMAL HIGH (ref 4.0–10.5)
nRBC: 0 % (ref 0.0–0.2)

## 2024-05-18 LAB — COMPREHENSIVE METABOLIC PANEL WITH GFR
ALT: 36 U/L (ref 0–44)
AST: 51 U/L — ABNORMAL HIGH (ref 15–41)
Albumin: 3.2 g/dL — ABNORMAL LOW (ref 3.5–5.0)
Alkaline Phosphatase: 69 U/L (ref 38–126)
Anion gap: 14 (ref 5–15)
BUN: 40 mg/dL — ABNORMAL HIGH (ref 8–23)
CO2: 20 mmol/L — ABNORMAL LOW (ref 22–32)
Calcium: 9 mg/dL (ref 8.9–10.3)
Chloride: 105 mmol/L (ref 98–111)
Creatinine, Ser: 2.92 mg/dL — ABNORMAL HIGH (ref 0.61–1.24)
GFR, Estimated: 22 mL/min — ABNORMAL LOW
Glucose, Bld: 170 mg/dL — ABNORMAL HIGH (ref 70–99)
Potassium: 4.7 mmol/L (ref 3.5–5.1)
Sodium: 138 mmol/L (ref 135–145)
Total Bilirubin: 0.8 mg/dL (ref 0.0–1.2)
Total Protein: 7.3 g/dL (ref 6.5–8.1)

## 2024-05-18 LAB — GLUCOSE, CAPILLARY
Glucose-Capillary: 160 mg/dL — ABNORMAL HIGH (ref 70–99)
Glucose-Capillary: 184 mg/dL — ABNORMAL HIGH (ref 70–99)
Glucose-Capillary: 144 mg/dL — ABNORMAL HIGH (ref 70–99)
Glucose-Capillary: 172 mg/dL — ABNORMAL HIGH (ref 70–99)

## 2024-05-18 LAB — HEPARIN LEVEL (UNFRACTIONATED): Heparin Unfractionated: 0.15 [IU]/mL — ABNORMAL LOW (ref 0.30–0.70)

## 2024-05-18 LAB — HEMOGLOBIN A1C
Hgb A1c MFr Bld: 6.4 % — ABNORMAL HIGH (ref 4.8–5.6)
Mean Plasma Glucose: 136.98 mg/dL

## 2024-05-18 MED ORDER — CEFADROXIL 500 MG PO CAPS
500.0000 mg | ORAL_CAPSULE | Freq: Two times a day (BID) | ORAL | Status: DC
  Administered 2024-05-18 – 2024-05-20 (×4): 500 mg via ORAL
  Filled 2024-05-18 (×5): qty 1

## 2024-05-18 MED ORDER — HEPARIN (PORCINE) 25000 UT/250ML-% IV SOLN
2100.0000 [IU]/h | INTRAVENOUS | Status: DC
  Administered 2024-05-18: 1500 [IU]/h via INTRAVENOUS
  Administered 2024-05-19: 1800 [IU]/h via INTRAVENOUS
  Filled 2024-05-18: qty 250

## 2024-05-18 MED ORDER — LATANOPROST 0.005 % OP SOLN
1.0000 [drp] | Freq: Every day | OPHTHALMIC | Status: DC
  Administered 2024-05-18 – 2024-05-19 (×2): 1 [drp] via OPHTHALMIC
  Filled 2024-05-18: qty 2.5

## 2024-05-18 MED ORDER — PREDNISONE 10 MG PO TABS: 10.0000 mg | ORAL_TABLET | Freq: Every day | ORAL | Status: DC

## 2024-05-18 MED ORDER — SODIUM CHLORIDE 0.9 % IV SOLN: INTRAVENOUS | Status: AC

## 2024-05-18 MED ORDER — BRIMONIDINE TARTRATE 0.15 % OP SOLN
1.0000 [drp] | Freq: Every day | OPHTHALMIC | Status: DC
  Administered 2024-05-18 – 2024-05-20 (×3): 1 [drp] via OPHTHALMIC
  Filled 2024-05-18: qty 5

## 2024-05-18 MED ORDER — DOXYCYCLINE HYCLATE 100 MG PO TABS
100.0000 mg | ORAL_TABLET | Freq: Two times a day (BID) | ORAL | Status: DC
  Administered 2024-05-20: 100 mg via ORAL
  Filled 2024-05-18: qty 1

## 2024-05-18 MED ORDER — HEPARIN (PORCINE) 25000 UT/250ML-% IV SOLN
1500.0000 [IU]/h | INTRAVENOUS | Status: DC
  Filled 2024-05-18: qty 250

## 2024-05-18 MED ORDER — PREDNISONE 20 MG PO TABS: 30.0000 mg | ORAL_TABLET | Freq: Every day | ORAL | Status: DC

## 2024-05-18 MED ORDER — OXYCODONE HCL 5 MG PO TABS: 5.0000 mg | ORAL_TABLET | ORAL | Status: DC | PRN

## 2024-05-18 MED ORDER — PREDNISONE 20 MG PO TABS
40.0000 mg | ORAL_TABLET | Freq: Every day | ORAL | Status: DC
  Administered 2024-05-19 – 2024-05-20 (×2): 40 mg via ORAL
  Filled 2024-05-18 (×2): qty 2

## 2024-05-18 MED ORDER — HYDROCORTISONE 5 MG PO TABS: 15.0000 mg | ORAL_TABLET | Freq: Every day | ORAL | Status: DC

## 2024-05-18 MED ORDER — DAPTOMYCIN-SODIUM CHLORIDE 700-0.9 MG/100ML-% IV SOLN
8.0000 mg/kg | INTRAVENOUS | Status: DC
  Administered 2024-05-18: 700 mg via INTRAVENOUS
  Filled 2024-05-18: qty 100

## 2024-05-18 MED ORDER — PREDNISONE 20 MG PO TABS: 20.0000 mg | ORAL_TABLET | Freq: Every day | ORAL | Status: DC

## 2024-05-18 MED ORDER — ACETAMINOPHEN 500 MG PO TABS
1000.0000 mg | ORAL_TABLET | Freq: Three times a day (TID) | ORAL | Status: DC
  Administered 2024-05-18 – 2024-05-20 (×6): 1000 mg via ORAL
  Filled 2024-05-18 (×7): qty 2

## 2024-05-18 MED ORDER — HYDROCORTISONE 10 MG PO TABS: 10.0000 mg | ORAL_TABLET | Freq: Every evening | ORAL | Status: DC

## 2024-05-18 MED ORDER — HEPARIN BOLUS VIA INFUSION
2500.0000 [IU] | Freq: Once | INTRAVENOUS | Status: DC
  Filled 2024-05-18: qty 2500

## 2024-05-18 MED ORDER — LACTATED RINGERS IV SOLN: INTRAVENOUS | Status: DC

## 2024-05-18 MED ORDER — OXYCODONE HCL 5 MG PO TABS: 10.0000 mg | ORAL_TABLET | ORAL | Status: DC | PRN

## 2024-05-18 MED ORDER — ACETAMINOPHEN 325 MG PO TABS: 650.0000 mg | ORAL_TABLET | Freq: Four times a day (QID) | ORAL | Status: DC | PRN

## 2024-05-18 MED ORDER — TIMOLOL MALEATE 0.5 % OP SOLN
1.0000 [drp] | Freq: Every day | OPHTHALMIC | Status: DC
  Administered 2024-05-18 – 2024-05-20 (×3): 1 [drp] via OPHTHALMIC
  Filled 2024-05-18: qty 5

## 2024-05-18 NOTE — Progress Notes (Signed)
 Full note to come

## 2024-05-18 NOTE — Progress Notes (Addendum)
 PHARMACY - ANTICOAGULATION CONSULT NOTE  Pharmacy Consult for Heparin  Indication: DVT  Allergies[1]  Patient Measurements: Height: 6' (182.9 cm) Weight: 111.6 kg (246 lb 0.5 oz) IBW/kg (Calculated) : 77.6 HEPARIN  DW (KG): 101.4  Vital Signs: Temp: 97.6 F (36.4 C) (01/09 0737) Temp Source: Oral (01/09 0737) BP: 110/76 (01/09 0737) Pulse Rate: 125 (01/09 0737)  Labs: Recent Labs    05/17/24 1003 05/18/24 0529  HGB 9.6* 9.3*  HCT 30.1* 28.8*  PLT 181 194  CREATININE 3.01* 2.92*    Estimated Creatinine Clearance: 28.2 mL/min (A) (by C-G formula based on SCr of 2.92 mg/dL (H)).   Medical History: Past Medical History:  Diagnosis Date   CKD (chronic kidney disease)    Diabetes mellitus without complication (HCC)    DM (diabetes mellitus), type 2 (HCC)    History of cerebral meningioma    History of pituitary adenoma    Hypertension    Hypothyroidism    OSA (obstructive sleep apnea)    Panhypopituitarism    Seizure disorder (HCC) 05/31/2017    Medications:  Scheduled:   acetaminophen   500 mg Oral Q6H   brimonidine   1 drop Both Eyes Daily   calcitRIOL   0.25 mcg Oral Daily   docusate sodium   100 mg Oral BID   [START ON 05/19/2024] hydrocortisone   15 mg Oral Daily   And   [START ON 05/19/2024] hydrocortisone   10 mg Oral QPM   hydrocortisone  sod succinate (SOLU-CORTEF ) inj  50 mg Intravenous Once   Followed by   hydrocortisone  sod succinate (SOLU-CORTEF ) inj  25 mg Intravenous Q8H   insulin  aspart  0-5 Units Subcutaneous QHS   insulin  aspart  0-9 Units Subcutaneous TID WC   latanoprost   1 drop Both Eyes QHS   levETIRAcetam   500 mg Oral BID   levothyroxine   88 mcg Oral QAC breakfast   polyethylene glycol  17 g Oral BID   tamsulosin   0.4 mg Oral Daily   timolol   1 drop Both Eyes Daily   Infusions:   cefTRIAXone  (ROCEPHIN )  IV     [START ON 05/19/2024] vancomycin       Assessment: 74 yo M presented to ED With 4d history of L knee pain and swelling.  Patient  with concern for septic arthritis, now s/p OR for washout 1/8.  Also noted to have acute intramuscular thrombosis involving the L soleal veins.  Pharmacy has been consulted to start heparin  for DVT treatment. 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  Received Lovenox  30mg  SQ x 1 at 0800.  Goal of Therapy:  Heparin  level 0.3-0.7 units/ml Monitor platelets by anticoagulation protocol: Yes   Plan:  No bolus due to recent Lovenox  dose Start heparin  infusion at 1500 units/hr Check anti-Xa level in 8 hours and daily while on heparin  Continue to monitor H&H and platelets  [employer], Pharm.D., BCPS Clinical Pharmacist Clinical phone for 05/18/2024 from 7:30-3:00 is [phone].  **Pharmacist phone directory can be found on [email or web address] listed under [facility].  05/18/2024 8:56 AM       [1] No Known Allergies

## 2024-05-18 NOTE — Consult Note (Signed)
 "        [facility]    Date of Admission:  05/17/2024     Reason for Consult: septic arthritis    Referring Provider: Perri     Lines:  Peripheral iv's  Abx: 1/9-c dapto 1/8-c ceftriaxone   1/8 vanc        Assessment: 74 yo male with gout, panhypopit, pit adenoma s/p craniotomy, dm2, ckd4, seizure, admitted with acute days on chronic left knee pain found to have septic arthritis s/p I&D (laparoscopically)   Patient started on empiric abx 05/17/24 after arthrocentesis drawn 05/17/24 arthrocentesis 64k wbc, 92% neutrophil; gout crystal also seen; cx ngtd S/p I&D same day and cx ngtd Bcx on admission negative   No recent abx or procedure  No trauma to knee    Afebrile; mild leukocytosis 12 on presentation 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 superimposed with gout flare Cx prior to abx given negative  Suspect all gout, although cx not one hundred percent sensitive    If primary team high concern could do doxy/cefadroxil  4 weeks, but a reasonable approach otherwise would be gout treatment and monitor      Plan: Await ortho opinion if rather just watch and wait For now can transition to oral abx doxy/cefadroxil  for 4 weeks from 1/8 to 06/14/24 F/u joint cx Id clinic f/u 1/27 @ 10am Gout support per  primary team No picc line from our standpoint Maintain standard isolation precaution       ------------------------------------------------ Principal Problem:   Septic arthritis (HCC) Active Problems:   Acute pain of left knee    HPI: Trevor Kelley is a 74 y.o. male with gout, panhypopit, pit adenoma s/p craniotomy, dm2, ckd4, seizure, admitted with acute days on chronic left knee pain found to have septic arthritis s/p I&D (laparoscopically)  He has had intermittent bilateral knee pain The last few days prior to admission had acute severe left knee pain and swelling so came for admission  Afebrile Mild  leukocytosis Arthrocentesis 64k wbc; gout crystal; cx negative Bcx negative All cx obtained prior to empiric abx Taken to or for lap I&D -- cx negative as well; this cx done after first dose abx  On dapto/ceftriaxone  now Pain better No fever, chill  Prior gout flare previously   Reviewed operative note     Family History  Problem Relation Age of Onset   Cancer - Colon Mother    Hypothyroidism Other    Diabetes Mellitus II Other    Cancer Other    Hypertension Other     Social History[1]  Allergies[2]  Review of Systems: ROS All Other ROS was negative, except mentioned above   Past Medical History:  Diagnosis Date   CKD (chronic kidney disease)    Diabetes mellitus without complication (HCC)    DM (diabetes mellitus), type 2 (HCC)    History of cerebral meningioma    History of pituitary adenoma    Hypertension    Hypothyroidism    OSA (obstructive sleep apnea)    Panhypopituitarism    Seizure disorder (HCC) 05/31/2017       Scheduled Meds:  acetaminophen   1,000 mg Oral Q8H   brimonidine   1 drop Both Eyes Daily   calcitRIOL   0.25 mcg Oral Daily   docusate sodium   100 mg Oral BID   [START ON 05/19/2024] hydrocortisone   15 mg Oral Daily   And   [START ON 05/19/2024] hydrocortisone   10 mg Oral QPM   hydrocortisone  sod succinate (  SOLU-CORTEF ) inj  50 mg Intravenous Once   Followed by   hydrocortisone  sod succinate (SOLU-CORTEF ) inj  25 mg Intravenous Q8H   insulin  aspart  0-5 Units Subcutaneous QHS   insulin  aspart  0-9 Units Subcutaneous TID WC   latanoprost   1 drop Both Eyes QHS   levETIRAcetam   500 mg Oral BID   levothyroxine   88 mcg Oral QAC breakfast   polyethylene glycol  17 g Oral BID   tamsulosin   0.4 mg Oral Daily   timolol   1 drop Both Eyes Daily   Continuous Infusions:  sodium chloride  100 mL/hr at 05/18/24 1023   cefTRIAXone  (ROCEPHIN )  IV     DAPTOmycin      heparin  1,500 Units/hr (05/18/24 1021)   PRN Meds:.acetaminophen  **FOLLOWED  BY** [START ON 05/25/2024] acetaminophen , metoCLOPramide  **OR** metoCLOPramide  (REGLAN ) injection, morphine  injection, ondansetron  **OR** ondansetron  (ZOFRAN ) IV, oxyCODONE  **OR** oxyCODONE    OBJECTIVE: Blood pressure 124/87, pulse 79, temperature 97.6 F (36.4 C), temperature source Oral, resp. rate 18, height 6' (1.829 m), weight 111.6 kg, SpO2 100%.  Physical Exam  General/constitutional: no distress, pleasant HEENT: Normocephalic, PER, Conj Clear, EOMI, Oropharynx clear Neck supple CV: rrr no mrg Lungs: clear to auscultation, normal respiratory effort Abd: Soft, Nontender Ext: no edema Skin: No Rash Neuro: nonfocal MSK: left knee dressing c/d/i  Lab Results Lab Results  Component Value Date   WBC 11.8 (H) 05/18/2024   HGB 9.3 (L) 05/18/2024   HCT 28.8 (L) 05/18/2024   MCV 75.4 (L) 05/18/2024   PLT 194 05/18/2024    Lab Results  Component Value Date   CREATININE 2.92 (H) 05/18/2024   BUN 40 (H) 05/18/2024   NA 138 05/18/2024   K 4.7 05/18/2024   CL 105 05/18/2024   CO2 20 (L) 05/18/2024    Lab Results  Component Value Date   ALT 36 05/18/2024   AST 51 (H) 05/18/2024   ALKPHOS 69 05/18/2024   BILITOT 0.8 05/18/2024      Microbiology: Recent Results (from the past 240 hours)  Body fluid culture w Gram Stain     Status: None (Preliminary result)   Collection Time: 05/17/24 12:39 PM   Specimen: Synovium; Body Fluid  Result Value Ref Range Status   Specimen Description SYNOVIAL  Final   Special Requests LEFT KNEE  Final   Gram Stain   Final    ABUNDANT WBC PRESENT, PREDOMINANTLY PMN NO ORGANISMS SEEN    Culture   Final    NO GROWTH < 24 HOURS Performed at [facility], [address], [place], [place] [address]    Report Status PENDING  Incomplete  Blood culture (routine x 2)     Status: None (Preliminary result)   Collection Time: 05/17/24  1:15 PM   Specimen: BLOOD LEFT ARM  Result Value Ref Range Status   Specimen Description BLOOD LEFT ARM   Final   Special Requests   Final    BOTTLES DRAWN AEROBIC AND ANAEROBIC Blood Culture adequate volume   Culture   Final    NO GROWTH < 24 HOURS Performed at [facility], [address] [place], [place] [address]    Report Status PENDING  Incomplete  Blood culture (routine x 2)     Status: None (Preliminary result)   Collection Time: 05/17/24  2:28 PM   Specimen: BLOOD  Result Value Ref Range Status   Specimen Description BLOOD SITE NOT SPECIFIED  Final   Special Requests   Final  BOTTLES DRAWN AEROBIC AND ANAEROBIC Blood Culture adequate volume   Culture   Final    NO GROWTH < 24 HOURS Performed at [facility], [address] [place], [place] [address]    Report Status PENDING  Incomplete  Aerobic/Anaerobic Culture w Gram Stain (surgical/deep wound)     Status: None (Preliminary result)   Collection Time: 05/17/24  8:06 PM   Specimen: Path fluid; Body Fluid  Result Value Ref Range Status   Specimen Description FLUID  Final   Special Requests FLUID FROM LEFT KNEE  Final   Gram Stain   Final    FEW WBC PRESENT, PREDOMINANTLY PMN NO ORGANISMS SEEN    Culture   Final    NO GROWTH < 12 HOURS Performed at [facility], [address] [place], [place] [address]    Report Status PENDING  Incomplete  Aerobic/Anaerobic Culture w Gram Stain (surgical/deep wound)     Status: None (Preliminary result)   Collection Time: 05/17/24  8:12 PM   Specimen: Path fluid; Body Fluid  Result Value Ref Range Status   Specimen Description FLUID  Final   Special Requests 2 LEFT KNEE FLUID  Final   Gram Stain   Final    RARE WBC PRESENT, PREDOMINANTLY PMN NO ORGANISMS SEEN    Culture   Final    NO GROWTH < 12 HOURS Performed at [facility], [address] [place], [place] [address]    Report Status PENDING  Incomplete     Serology:    Imaging: If present, new imagings (plain films, ct scans, and mri) have been personally visualized and interpreted;  radiology reports have been reviewed. Decision making incorporated into the Impression / Recommendations.  1/8 xray knee IMPRESSION: Joint effusion without acute bony findings.  Constance ONEIDA Passer, MD [facility] [phone] pager    05/18/2024, 1:51 PM     [1]  Social History Tobacco Use   Smoking status: Never   Smokeless tobacco: Never  Vaping Use   Vaping status: Never Used  Substance Use Topics   Alcohol use: Never    Comment: occassional   Drug use: No  [2] No Known Allergies  "

## 2024-05-18 NOTE — Evaluation (Signed)
 Physical Therapy Evaluation Patient Details Name: Trevor Kelley MRN: [id number] DOB: 01-08-1951 Today's Date: 05/18/2024  History of Present Illness  74 y.o. male presents to [facility] hospital on 05/17/2024 with L knee pain. Pt underwent arthrocentesis on 1/8 which was suggestive of septic arthritis, taken to OR on 1/8 for L knee arthroscopy, I&D and partial synovectomy. Pt also with LLE DVT. PMH includes pituitary adenoma s/p craniotomy, DMII, CKD IV, seizure.  Clinical Impression  Pt presents to PT with deficits in functional mobility, gait, balance, strength, power, ROM. Pt requires cues to encourage anterior weight shift during transfers and for hand placement. Pt is able to ambulate for short household distances with support of a RW. PT provides education on HEP to improve knee flexion ROM. Pt will benefit from continued acute PT services in an effort to improve mobility function and restore independence. HHPT is recommended pending continued progress.        If plan is discharge home, recommend the following: A little help with walking and/or transfers;A little help with bathing/dressing/bathroom;Assistance with cooking/housework;Assist for transportation;Help with stairs or ramp for entrance   Can travel by private vehicle        Equipment Recommendations BSC/3in1  Recommendations for Other Services       Functional Status Assessment Patient has had a recent decline in their functional status and demonstrates the ability to make significant improvements in function in a reasonable and predictable amount of time.     Precautions / Restrictions Precautions Precautions: Fall Recall of Precautions/Restrictions: Intact Restrictions Weight Bearing Restrictions Per Provider Order: Yes LLE Weight Bearing Per Provider Order: Weight bearing as tolerated      Mobility  Bed Mobility Overal bed mobility: Needs Assistance Bed Mobility: Supine to Sit     Supine to sit: Min assist, HOB  elevated     General bed mobility comments: assist for LLE    Transfers Overall transfer level: Needs assistance Equipment used: Rolling walker (2 wheels) Transfers: Sit to/from Stand Sit to Stand: Min assist, From elevated surface           General transfer comment: verbal cues for hand placement and to emphasize increased anterior weight shift    Ambulation/Gait Ambulation/Gait assistance: Contact guard assist Gait Distance (Feet): 60 Feet Assistive device: Rolling walker (2 wheels) Gait Pattern/deviations: Step-through pattern Gait velocity: reduced Gait velocity interpretation: <1.8 ft/sec, indicate of risk for recurrent falls   General Gait Details: slowed step-through gait, reduced stance time on LLE  Stairs            Wheelchair Mobility     Tilt Bed    Modified Rankin (Stroke Patients Only)       Balance Overall balance assessment: Needs assistance Sitting-balance support: No upper extremity supported, Feet supported Sitting balance-Leahy Scale: Good     Standing balance support: Single extremity supported, Reliant on assistive device for balance Standing balance-Leahy Scale: Poor                               Pertinent Vitals/Pain Pain Assessment Pain Assessment: No/denies pain    Home Living Family/patient expects to be discharged to:: Private residence Living Arrangements: Spouse/significant other Available Help at Discharge: Family;Available 24 hours/day Type of Home: House Home Access: Stairs to enter Entrance Stairs-Rails: Right;Left;None (typically utilize garage entrance without railins, does have an entrance with 3 steps and railings if needed) Entrance Stairs-Number of Steps: 3   Home Layout: Two  level;Able to live on main level with bedroom/bathroom Home Equipment: Rolling Walker (2 wheels);Cane - single point;Crutches      Prior Function Prior Level of Function : Independent/Modified Independent              Mobility Comments: ambulatory without DME       Extremity/Trunk Assessment   Upper Extremity Assessment Upper Extremity Assessment: Overall WFL for tasks assessed    Lower Extremity Assessment Lower Extremity Assessment: LLE deficits/detail LLE Deficits / Details: generalized weakness, pt with active L knee flexion to ~70 degrees    Cervical / Trunk Assessment Cervical / Trunk Assessment: Normal  Communication   Communication Communication: No apparent difficulties    Cognition Arousal: Alert Behavior During Therapy: WFL for tasks assessed/performed   PT - Cognitive impairments: Problem solving                       PT - Cognition Comments: history of crani Following commands: Intact       Cueing Cueing Techniques: Verbal cues     General Comments General comments (skin integrity, edema, etc.): pt with tachycardia into 120s at rest, up to 130s with mobility. BP and SpO2 stable    Exercises     Assessment/Plan    PT Assessment Patient needs continued PT services  PT Problem List Decreased strength;Decreased range of motion;Decreased activity tolerance;Decreased balance;Decreased mobility;Decreased knowledge of use of DME;Pain       PT Treatment Interventions DME instruction;Gait training;Stair training;Functional mobility training;Therapeutic activities;Therapeutic exercise;Balance training;Neuromuscular re-education;Patient/family education    PT Goals (Current goals can be found in the Care Plan section)  Acute Rehab PT Goals Patient Stated Goal: to return to independence PT Goal Formulation: With patient/family Time For Goal Achievement: 06/01/24 Potential to Achieve Goals: Fair    Frequency Min 3X/week     Co-evaluation               AM-PAC PT 6 Clicks Mobility  Outcome Measure Help needed turning from your back to your side while in a flat bed without using bedrails?: A Little Help needed moving from lying on your back to  sitting on the side of a flat bed without using bedrails?: A Little Help needed moving to and from a bed to a chair (including a wheelchair)?: A Little Help needed standing up from a chair using your arms (e.g., wheelchair or bedside chair)?: A Little Help needed to walk in hospital room?: A Little Help needed climbing 3-5 steps with a railing? : A Lot 6 Click Score: 17    End of Session Equipment Utilized During Treatment: Gait belt Activity Tolerance: Patient tolerated treatment well Patient left: in chair;with call bell/phone within reach;with family/visitor present Nurse Communication: Mobility status PT Visit Diagnosis: Other abnormalities of gait and mobility (R26.89);Muscle weakness (generalized) (M62.81)    Time: 9146-9059 PT Time Calculation (min) (ACUTE ONLY): 47 min   Charges:   PT Evaluation $PT Eval Low Complexity: 1 Low PT Treatments $Therapeutic Activity: 8-22 mins PT General Charges $$ ACUTE PT VISIT: 1 Visit         Bernardino JINNY Ruth, PT, DPT Acute Rehabilitation Office [phone]   Bernardino JINNY Ruth 05/18/2024, 9:53 AM

## 2024-05-18 NOTE — Progress Notes (Signed)
 PHARMACY - ANTICOAGULATION CONSULT NOTE  Pharmacy Consult for Heparin  Indication: DVT  Allergies[1]  Patient Measurements: Height: 6' (182.9 cm) Weight: 111.6 kg (246 lb 0.5 oz) IBW/kg (Calculated) : 77.6 HEPARIN  DW (KG): 101.4  Vital Signs: Temp: 97.8 F (36.6 C) (01/09 1659) Temp Source: Oral (01/09 1659) BP: 121/81 (01/09 1659) Pulse Rate: 79 (01/09 1659)  Labs: Recent Labs    05/17/24 1003 05/18/24 0529 05/18/24 1910  HGB 9.6* 9.3*  --   HCT 30.1* 28.8*  --   PLT 181 194  --   HEPARINUNFRC  --   --  0.15*  CREATININE 3.01* 2.92*  --     Estimated Creatinine Clearance: 28.2 mL/min (A) (by C-G formula based on SCr of 2.92 mg/dL (H)).   Medical History: Past Medical History:  Diagnosis Date   CKD (chronic kidney disease)    Diabetes mellitus without complication (HCC)    DM (diabetes mellitus), type 2 (HCC)    History of cerebral meningioma    History of pituitary adenoma    Hypertension    Hypothyroidism    OSA (obstructive sleep apnea)    Panhypopituitarism    Seizure disorder (HCC) 05/31/2017    Medications:  Scheduled:   acetaminophen   1,000 mg Oral Q8H   brimonidine   1 drop Both Eyes Daily   calcitRIOL   0.25 mcg Oral Daily   cefadroxil   500 mg Oral BID   docusate sodium   100 mg Oral BID   [START ON 05/20/2024] doxycycline   100 mg Oral Q12H   [START ON 05/31/2024] hydrocortisone   15 mg Oral Daily   And   [START ON 05/31/2024] hydrocortisone   10 mg Oral QPM   hydrocortisone  sod succinate (SOLU-CORTEF ) inj  50 mg Intravenous Once   insulin  aspart  0-5 Units Subcutaneous QHS   insulin  aspart  0-9 Units Subcutaneous TID WC   latanoprost   1 drop Both Eyes QHS   levETIRAcetam   500 mg Oral BID   levothyroxine   88 mcg Oral QAC breakfast   polyethylene glycol  17 g Oral BID   [START ON 05/19/2024] predniSONE   40 mg Oral Q breakfast   Followed by   NOREEN ON 05/22/2024] predniSONE   30 mg Oral Q breakfast   Followed by   NOREEN ON 05/25/2024] predniSONE    20 mg Oral Q breakfast   Followed by   NOREEN ON 05/28/2024] predniSONE   10 mg Oral Q breakfast   tamsulosin   0.4 mg Oral Daily   timolol   1 drop Both Eyes Daily   Infusions:   sodium chloride  100 mL/hr at 05/18/24 1023   heparin  1,500 Units/hr (05/18/24 1021)    Assessment: 74 yo M presented to ED With 4d history of L knee pain and swelling.  Patient with concern for septic arthritis, now s/p OR for washout 1/8.  Also noted to have acute intramuscular thrombosis involving the L soleal veins.  Pharmacy has been consulted to start heparin  for DVT treatment. 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  Received Lovenox  30mg  SQ x 1 at 0800.  1/9 PM: Heparin  level 0.15, subtherapeutic. No infusion issues or bleeding noted. Will increase infusion rate.  Goal of Therapy:  Heparin  level 0.3-0.7 units/ml Monitor platelets by anticoagulation protocol: Yes   Plan:  No bolus due to recent Lovenox  dose Increase heparin  infusion to 1800 units/hr Check anti-Xa level with AM labs and daily while on heparin  Continue to monitor H&H and platelets  Larraine Brazier, PharmD Clinical Pharmacist 05/18/2024  7:48 PM **Pharmacist phone  directory can now be found on [email or web address] ([email or web address]).  Listed under [facility].          [1] No Known Allergies

## 2024-05-18 NOTE — Progress Notes (Signed)
" °   05/18/24 0737  Assess: MEWS Score  Temp 97.6 F (36.4 C)  BP 110/76  MAP (mmHg) 88  Pulse Rate (!) 125  Resp 18  SpO2 95 %  Assess: MEWS Score  MEWS Temp 0  MEWS Systolic 0  MEWS Pulse 2  MEWS RR 0  MEWS LOC 0  MEWS Score 2  MEWS Score Color Yellow  Assess: if the MEWS score is Yellow or Red  Were vital signs accurate and taken at a resting state? Yes  Does the patient meet 2 or more of the SIRS criteria? No  MEWS guidelines implemented  Yes, yellow  Treat  MEWS Interventions Considered administering scheduled or prn medications/treatments as ordered  Take Vital Signs  Increase Vital Sign Frequency  Yellow: Q2hr x1, continue Q4hrs until patient remains green for 12hrs  Escalate  MEWS: Escalate Yellow: Discuss with charge nurse and consider notifying provider and/or RRT  Notify: Charge Nurse/RN  Name of Charge Nurse/RN Notified  Roxana, RN)  Provider Notification  Provider Name/Title Perri, MD  Date Provider Notified 05/18/24  Time Provider Notified 0800  Method of Notification Page (secure chat)  Notification Reason Other (Comment) (HR > 120)  Provider response At bedside  Date of Provider Response 05/18/24  Time of Provider Response [phone]  Assess: SIRS CRITERIA  SIRS Temperature  0  SIRS Respirations  0  SIRS Pulse 1  SIRS WBC 0  SIRS Score Sum  1   HR sustaining in the 120's, BP stable and patient is asymptomatic. EKG obtained, Sinus Tach, MD at the bedside, plans to start IV fluid. "

## 2024-05-18 NOTE — Progress Notes (Signed)
 " PROGRESS NOTE    Trevor Kelley  FMW:[id number] DOB: [birth year] DOA: 05/17/2024 PCP: Trevor Kelley, Trevor Kelley  Chief Complaint  Patient presents with   Fall    Brief Narrative:   Trevor Kelley is Trevor Kelley 74 y.o. male with medical history significant of panhypopituitarism, pituitary adenoma s/p craniotomy, T2DM, CKD IV, seizure disorder and other medical issues here with L knee pain.   Found to have septic arthritis, now s/p arthroscopy with irrigation and debridement with partial synovectomy 1/9.    Assessment & Plan:   Principal Problem:   Septic arthritis (HCC) Active Problems:   Acute pain of left knee  Septic Arthritis of Left Knee Gout  Leukocytosis Sepsis ruled out S/p arthrocentesis - suggestive of septic arthritis with >60,000 WBC, 92% neutrophils - also with monosodium urate crystals suggestive of gout Blood cultures pending Synovial fluid culture pending S/p L knee arthroscopy, irrigation and debridement with partial synovectomy 1/9.  Surgical cultures pending.  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 IV steroids for now, will resume PO steroids 1/10  Continue synthroid  (note TSH unreliable in central hypothyroidism, aiming for free T4 of ~1) Testosterone  on hold for now, follows with [facility] U[facility] outpatient Follows with endocrine outpatient    Seizure Disorder Continue keppra    CKD IV Baseline creatinine appears to be around 2.7 Calcitriol     Hypertension Holding amlodipine , kerendia, olmesartan, hydrochlorothiazide BP currently normal - continue to hold  antihypertensives Per wife, lasix  recently discontinued   T2DM SSI Oral meds on hold  Dyslipidemia Statin on hold for now   BPH Flomax    Glaucoma Home eyedrops   Class 1 Obesity  Body mass index is 33.37 kg/m.    DVT prophylaxis: heparin  gtt Code Status: full Family Communication: wife at bedside Disposition:   Status is: Inpatient Remains inpatient appropriate because: need for continued orthopedic and ID care   Consultants:  Orthopedics Infectious disease  Procedures:   PROCEDURES PERFORMED:  1. Left knee arthroscopy irrigation and debridement with partial synovectomy  Antimicrobials:  Anti-infectives (From admission, onward)    Start     Dose/Rate Route Frequency Ordered Stop   05/19/24 1800  vancomycin  (VANCOREADY) IVPB 1250 mg/250 mL        1,250 mg 166.7 mL/hr over 90 Minutes Intravenous Every 48 hours 05/17/24 1857     05/18/24 1600  cefTRIAXone  (ROCEPHIN ) 2 g in sodium chloride  0.9 % 100 mL IVPB       Note to Pharmacy: Retime as needed   2 g 200 mL/hr over 30 Minutes Intravenous Every 24 hours 05/17/24 1821     05/18/24 0600  ceFAZolin  (ANCEF ) IVPB 2g/100 mL premix  Status:  Discontinued        2 g 200 mL/hr over 30 Minutes Intravenous On call to O.R. 05/17/24 1829 05/17/24 2142   05/17/24 2200  ceFAZolin  (ANCEF ) IVPB 2g/100 mL premix  Status:  Discontinued        2 g 200 mL/hr over 30 Minutes Intravenous Every 6 hours 05/17/24 2146 05/17/24 2150   05/17/24 1630  cefTRIAXone  (ROCEPHIN ) 2 g in sodium chloride  0.9 % 100 mL IVPB        2 g 200  mL/hr over 30 Minutes Intravenous  Once 05/17/24 1615 05/17/24 1742   05/17/24 1630  vancomycin  (VANCOREADY) IVPB 2000 mg/400 mL        2,000 mg 200 mL/hr over 120 Minutes Intravenous  Once 05/17/24 1619 05/17/24 1833   05/17/24 1615  vancomycin  (VANCOREADY) IVPB 1500 mg/300 mL  Status:  Discontinued        1,500 mg 150 mL/hr over 120 Minutes Intravenous  Once 05/17/24 1615 05/17/24 1619        Subjective: No complaints Pain is adequately controlled  Objective: Vitals:   05/17/24 2130 05/17/24 2157 05/18/24 0241 05/18/24 0737  BP: 113/78 117/76 97/74 110/76  Pulse: 93 92 98 (!) 125  Resp: (!) 21 20 17 18   Temp: 97.8 F (36.6 C) 97.6 F (36.4 C) 97.7 F (36.5 C) 97.6 F (36.4 C)  TempSrc:  Oral  Oral  SpO2: 93% 95% 96% 95%  Weight:      Height:        Intake/Output Summary (Last 24 hours) at 05/18/2024 0858 Last data filed at 05/18/2024 0321 Gross per 24 hour  Intake 1000 ml  Output 200 ml  Net 800 ml   Filed Weights   05/17/24 0629  Weight: 111.6 kg    Examination:  General exam: Appears calm and comfortable  Respiratory system: Clear to auscultation. Respiratory effort normal. Cardiovascular system: regular, tachy Gastrointestinal system: Abdomen is nondistended, soft and nontender Central nervous system: Alert and oriented. No focal neurological deficits. Extremities: L knee with dressing in place   Data Reviewed: I have personally reviewed following labs and imaging studies  CBC: Recent Labs  Lab 05/17/24 1003 05/18/24 0529  WBC 14.3* 11.8*  NEUTROABS 9.6*  --   HGB 9.6* 9.3*  HCT 30.1* 28.8*  MCV 77.4* 75.4*  PLT 181 194    Basic Metabolic Panel: Recent Labs  Lab 05/17/24 1003 05/18/24 0529  NA 137 138  K 3.3* 4.7  CL 102 105  CO2 23 20*  GLUCOSE 85 170*  BUN 37* 40*  CREATININE 3.01* 2.92*  CALCIUM 8.9 9.0    GFR: Estimated Creatinine Clearance: 28.2 mL/min (Trevor Kelley) (by C-G formula based on SCr of 2.92 mg/dL (H)).  Liver Function Tests: Recent Labs  Lab 05/17/24 1003 05/18/24 0529  AST 31 51*  ALT 16 36  ALKPHOS 61 69  BILITOT 1.0 0.8  PROT 6.9 7.3  ALBUMIN  3.1* 3.2*    CBG: Recent Labs  Lab 05/17/24 1820 05/17/24 2035 05/18/24 0740  GLUCAP 132* 134* 160*     Recent Results (from the past 240 hours)  Body fluid culture w Gram Stain     Status: None (Preliminary result)   Collection Time: 05/17/24 12:39 PM    Specimen: Synovium; Body Fluid  Result Value Ref Range Status   Specimen Description SYNOVIAL  Final   Special Requests LEFT KNEE  Final   Gram Stain   Final    ABUNDANT WBC PRESENT, PREDOMINANTLY PMN NO ORGANISMS SEEN Performed at [facility], [address], [place], [place] [address]    Culture PENDING  Incomplete   Report Status PENDING  Incomplete  Blood culture (routine x 2)     Status: None (Preliminary result)   Collection Time: 05/17/24  1:15 PM   Specimen: BLOOD LEFT ARM  Result Value Ref Range Status   Specimen Description BLOOD LEFT ARM  Final   Special Requests   Final    BOTTLES DRAWN AEROBIC AND ANAEROBIC Blood Culture adequate volume  Culture   Final    NO GROWTH < 24 HOURS Performed at [facility], [address] [place], [place] [address]    Report Status PENDING  Incomplete  Blood culture (routine x 2)     Status: None (Preliminary result)   Collection Time: 05/17/24  2:28 PM   Specimen: BLOOD  Result Value Ref Range Status   Specimen Description BLOOD SITE NOT SPECIFIED  Final   Special Requests   Final    BOTTLES DRAWN AEROBIC AND ANAEROBIC Blood Culture adequate volume   Culture   Final    NO GROWTH < 24 HOURS Performed at [facility], [address], [place], [place] [address]    Report Status PENDING  Incomplete  Aerobic/Anaerobic Culture w Gram Stain (surgical/deep wound)     Status: None (Preliminary result)   Collection Time: 05/17/24  8:06 PM   Specimen: Path fluid; Body Fluid  Result Value Ref Range Status   Specimen Description FLUID  Final   Special Requests FLUID FROM LEFT KNEE  Final   Gram Stain   Final    FEW WBC PRESENT, PREDOMINANTLY PMN NO ORGANISMS SEEN    Culture   Final    NO GROWTH < 12 HOURS Performed at [facility], [address], [place], [place] [address]    Report Status PENDING  Incomplete  Aerobic/Anaerobic Culture w Gram Stain (surgical/deep wound)     Status: None (Preliminary  result)   Collection Time: 05/17/24  8:12 PM   Specimen: Path fluid; Body Fluid  Result Value Ref Range Status   Specimen Description FLUID  Final   Special Requests 2 LEFT KNEE FLUID  Final   Gram Stain   Final    RARE WBC PRESENT, PREDOMINANTLY PMN NO ORGANISMS SEEN    Culture   Final    NO GROWTH < 12 HOURS Performed at [facility], [address] [place], [place] [address]    Report Status PENDING  Incomplete         Radiology Studies: VAS US  LOWER EXTREMITY VENOUS (DVT) (7a-5p) Result Date: 05/17/2024  Lower Venous DVT Study Patient Name:  Trevor Kelley  Date of Exam:   05/17/2024 Medical Rec #: [id number]            Accession #:    [id number] Date of Birth: [birth year]             Patient Gender: M Patient Age:   20 years Exam Location:  [facility] Procedure:      VAS US  LOWER EXTREMITY VENOUS (DVT) Referring Phys: WARREN BARRETT --------------------------------------------------------------------------------  Indications: Pain, and Swelling.  Limitations: Poor ultrasound/tissue interface. Comparison Study: Previous exam on 06/23/2020 was negative for DVT Performing Technologist: Ezzie Potters RVT, RDMS  Examination Guidelines: Lyndi Holbein complete evaluation includes B-mode imaging, spectral Doppler, color Doppler, and power Doppler as needed of all accessible portions of each vessel. Bilateral testing is considered an integral part of Jodeci Roarty complete examination. Limited examinations for reoccurring indications may be performed as noted. The reflux portion of the exam is performed with the patient in reverse Trendelenburg.  +-----+---------------+---------+-----------+----------+--------------+ RIGHTCompressibilityPhasicitySpontaneityPropertiesThrombus Aging +-----+---------------+---------+-----------+----------+--------------+ CFV  Full           Yes      Yes                                 +-----+---------------+---------+-----------+----------+--------------+    +---------+---------------+---------+-----------+----------+-------------------+  LEFT     CompressibilityPhasicitySpontaneityPropertiesThrombus Aging      +---------+---------------+---------+-----------+----------+-------------------+ CFV      Full           Yes      Yes                                      +---------+---------------+---------+-----------+----------+-------------------+ SFJ      Full                                                             +---------+---------------+---------+-----------+----------+-------------------+ FV Prox  Full           Yes      Yes                                      +---------+---------------+---------+-----------+----------+-------------------+ FV Mid   Full           Yes      Yes                                      +---------+---------------+---------+-----------+----------+-------------------+ FV DistalFull           Yes      Yes                                      +---------+---------------+---------+-----------+----------+-------------------+ PFV      Full                                                             +---------+---------------+---------+-----------+----------+-------------------+ POP      Full           Yes      Yes                                      +---------+---------------+---------+-----------+----------+-------------------+ PTV      Full                                                             +---------+---------------+---------+-----------+----------+-------------------+ PERO                                                  Not well visualized +---------+---------------+---------+-----------+----------+-------------------+ Soleal   None           No       No  Acute               +---------+---------------+---------+-----------+----------+-------------------+    Summary: RIGHT: - No evidence of common femoral vein obstruction.   LEFT: Findings  consistent with acute intramuscular thrombosis involving the left soleal veins. - There is no evidence of deep vein thrombosis in the lower extremity.  - No cystic structure found in the popliteal fossa. - Ultrasound characteristics of enlarged lymph nodes noted in the groin.  *See table(s) above for measurements and observations. Electronically signed by Debby Robertson on 05/17/2024 at 8:20:45 PM.    Final    DG Ankle Complete Left Result Date: 05/17/2024 CLINICAL DATA:  Fall.  Pain. EXAM: LEFT ANKLE COMPLETE - 3+ VIEW COMPARISON:  No comparison studies available. FINDINGS: No evidence for an acute fracture or dislocation. Ankle mortise is preserved. Tiny fragments adjacent to the tip of the lateral malleolus are consistent with age indeterminate avulsion injury. Soft tissue swelling evident. IMPRESSION: Tiny fragments adjacent to the tip of the lateral malleolus are consistent with age indeterminate avulsion injury. Otherwise no acute bony findings. Electronically Signed   By: Camellia Candle M.D.   On: 05/17/2024 07:31   DG Knee Complete 4 Views Left Result Date: 05/17/2024 CLINICAL DATA:  Fall.  Knee pain. EXAM: LEFT KNEE - COMPLETE 4+ VIEW COMPARISON:  None Available. FINDINGS: No evidence for an acute fracture. No subluxation or dislocation. No worrisome lytic or sclerotic osseous abnormality. Joint effusion evident. IMPRESSION: Joint effusion without acute bony findings. Electronically Signed   By: Camellia Candle M.D.   On: 05/17/2024 07:30        Scheduled Meds:  acetaminophen   500 mg Oral Q6H   brimonidine   1 drop Both Eyes Daily   calcitRIOL   0.25 mcg Oral Daily   docusate sodium   100 mg Oral BID   [START ON 05/19/2024] hydrocortisone   15 mg Oral Daily   And   [START ON 05/19/2024] hydrocortisone   10 mg Oral QPM   hydrocortisone  sod succinate (SOLU-CORTEF ) inj  50 mg Intravenous Once   Followed by   hydrocortisone  sod succinate (SOLU-CORTEF ) inj  25 mg Intravenous Q8H   insulin  aspart  0-5  Units Subcutaneous QHS   insulin  aspart  0-9 Units Subcutaneous TID WC   latanoprost   1 drop Both Eyes QHS   levETIRAcetam   500 mg Oral BID   levothyroxine   88 mcg Oral QAC breakfast   polyethylene glycol  17 g Oral BID   tamsulosin   0.4 mg Oral Daily   timolol   1 drop Both Eyes Daily   Continuous Infusions:  cefTRIAXone  (ROCEPHIN )  IV     lactated ringers      [START ON 05/19/2024] vancomycin        LOS: 1 day    Time spent: over 30 min     Meliton Monte, Kelley [facility]   To contact the attending provider between 7A-7P or the covering provider during after hours 7P-7A, please log into the web site [email or web address] and access using universal [place] password for that web site. If you do not have the password, please call the hospital operator.  05/18/2024, 8:58 AM    "

## 2024-05-19 DIAGNOSIS — M25562 Pain in left knee: Secondary | ICD-10-CM | POA: Diagnosis not present

## 2024-05-19 LAB — CBC WITH DIFFERENTIAL/PLATELET
Abs Immature Granulocytes: 0.11 K/uL — ABNORMAL HIGH (ref 0.00–0.07)
Basophils Absolute: 0 K/uL (ref 0.0–0.1)
Basophils Relative: 0 %
Eosinophils Absolute: 0 K/uL (ref 0.0–0.5)
Eosinophils Relative: 0 %
HCT: 24.7 % — ABNORMAL LOW (ref 39.0–52.0)
Hemoglobin: 8.1 g/dL — ABNORMAL LOW (ref 13.0–17.0)
Immature Granulocytes: 1 %
Lymphocytes Relative: 8 %
Lymphs Abs: 1.1 K/uL (ref 0.7–4.0)
MCH: 24.3 pg — ABNORMAL LOW (ref 26.0–34.0)
MCHC: 32.8 g/dL (ref 30.0–36.0)
MCV: 74.2 fL — ABNORMAL LOW (ref 80.0–100.0)
Monocytes Absolute: 0.9 K/uL (ref 0.1–1.0)
Monocytes Relative: 7 %
Neutro Abs: 10.9 K/uL — ABNORMAL HIGH (ref 1.7–7.7)
Neutrophils Relative %: 84 %
Platelets: 202 K/uL (ref 150–400)
RBC: 3.33 MIL/uL — ABNORMAL LOW (ref 4.22–5.81)
RDW: 16.8 % — ABNORMAL HIGH (ref 11.5–15.5)
WBC: 13 K/uL — ABNORMAL HIGH (ref 4.0–10.5)
nRBC: 0 % (ref 0.0–0.2)

## 2024-05-19 LAB — COMPREHENSIVE METABOLIC PANEL WITH GFR
ALT: 35 U/L (ref 0–44)
AST: 43 U/L — ABNORMAL HIGH (ref 15–41)
Albumin: 2.8 g/dL — ABNORMAL LOW (ref 3.5–5.0)
Alkaline Phosphatase: 87 U/L (ref 38–126)
Anion gap: 13 (ref 5–15)
BUN: 56 mg/dL — ABNORMAL HIGH (ref 8–23)
CO2: 18 mmol/L — ABNORMAL LOW (ref 22–32)
Calcium: 8.5 mg/dL — ABNORMAL LOW (ref 8.9–10.3)
Chloride: 105 mmol/L (ref 98–111)
Creatinine, Ser: 2.59 mg/dL — ABNORMAL HIGH (ref 0.61–1.24)
GFR, Estimated: 25 mL/min — ABNORMAL LOW
Glucose, Bld: 159 mg/dL — ABNORMAL HIGH (ref 70–99)
Potassium: 4.4 mmol/L (ref 3.5–5.1)
Sodium: 136 mmol/L (ref 135–145)
Total Bilirubin: 0.4 mg/dL (ref 0.0–1.2)
Total Protein: 6.5 g/dL (ref 6.5–8.1)

## 2024-05-19 LAB — TYPE AND SCREEN
ABO/RH(D): O POS
Antibody Screen: NEGATIVE

## 2024-05-19 LAB — GLUCOSE, CAPILLARY
Glucose-Capillary: 130 mg/dL — ABNORMAL HIGH (ref 70–99)
Glucose-Capillary: 130 mg/dL — ABNORMAL HIGH (ref 70–99)
Glucose-Capillary: 123 mg/dL — ABNORMAL HIGH (ref 70–99)
Glucose-Capillary: 161 mg/dL — ABNORMAL HIGH (ref 70–99)

## 2024-05-19 LAB — PHOSPHORUS: Phosphorus: 4.4 mg/dL (ref 2.5–4.6)

## 2024-05-19 LAB — IRON AND TIBC
Iron: 62 ug/dL (ref 45–182)
Saturation Ratios: 50 % — ABNORMAL HIGH (ref 17.9–39.5)
TIBC: 123 ug/dL — ABNORMAL LOW (ref 250–450)
UIBC: 61 ug/dL

## 2024-05-19 LAB — FERRITIN: Ferritin: 1175 ng/mL — ABNORMAL HIGH (ref 24–336)

## 2024-05-19 LAB — HEMOGLOBIN AND HEMATOCRIT, BLOOD
HCT: 23.4 % — ABNORMAL LOW (ref 39.0–52.0)
HCT: 29.1 % — ABNORMAL LOW (ref 39.0–52.0)
Hemoglobin: 7.6 g/dL — ABNORMAL LOW (ref 13.0–17.0)
Hemoglobin: 9.2 g/dL — ABNORMAL LOW (ref 13.0–17.0)

## 2024-05-19 LAB — MAGNESIUM: Magnesium: 2.4 mg/dL (ref 1.7–2.4)

## 2024-05-19 LAB — VITAMIN B12: Vitamin B-12: 495 pg/mL (ref 180–914)

## 2024-05-19 LAB — HEPARIN LEVEL (UNFRACTIONATED): Heparin Unfractionated: 0.21 [IU]/mL — ABNORMAL LOW (ref 0.30–0.70)

## 2024-05-19 LAB — SEDIMENTATION RATE: Sed Rate: 85 mm/h — ABNORMAL HIGH (ref 0–16)

## 2024-05-19 LAB — FOLATE: Folate: 4.5 ng/mL — ABNORMAL LOW

## 2024-05-19 LAB — C-REACTIVE PROTEIN: CRP: 19.9 mg/dL — ABNORMAL HIGH

## 2024-05-19 MED ORDER — APIXABAN 5 MG PO TABS: 5.0000 mg | ORAL_TABLET | Freq: Two times a day (BID) | ORAL | Status: DC

## 2024-05-19 MED ORDER — OXYCODONE HCL 5 MG PO TABS: 5.0000 mg | ORAL_TABLET | Freq: Four times a day (QID) | ORAL | 0 refills | Status: DC | PRN

## 2024-05-19 MED ORDER — FOLIC ACID 1 MG PO TABS
1.0000 mg | ORAL_TABLET | Freq: Every day | ORAL | Status: DC
  Administered 2024-05-19 – 2024-05-20 (×2): 1 mg via ORAL
  Filled 2024-05-19 (×2): qty 1

## 2024-05-19 MED ORDER — ACETAMINOPHEN 325 MG PO TABS: ORAL_TABLET | ORAL | 0 refills | Status: DC

## 2024-05-19 MED ORDER — APIXABAN 5 MG PO TABS
10.0000 mg | ORAL_TABLET | Freq: Two times a day (BID) | ORAL | Status: DC
  Administered 2024-05-19 – 2024-05-20 (×3): 10 mg via ORAL
  Filled 2024-05-19 (×3): qty 2

## 2024-05-19 NOTE — Progress Notes (Addendum)
" ° °  Subjective:  74 y.o. male now POD 2 from left knee arthroscopic irrigation and debridement with partial synovectomy. Patient reports that he has very little pain in his knee.  Knee ROM has significantly improved.  He has had no fevers and chills and feels well enough to go home  Otherwise, no acute events overnight. No numbness or tingling to the extremity.  Objective:   VITALS:   Vitals:   05/19/24 0344 05/19/24 0809 05/19/24 0812 05/19/24 1121  BP: 106/73  112/72 125/75  Pulse: 63  63 60  Resp: 16  17 18   Temp: 98 F (36.7 C) 98 F (36.7 C) 98 F (36.7 C) 98 F (36.7 C)  TempSrc: Oral Oral    SpO2: 96%  99% 100%  Weight:      Height:        Physical Exam: General: Alert, no acute distress Cardiovascular: No pedal edema Respiratory: No cyanosis, no use of accessory musculature GI: No organomegaly, abdomen is soft and non-tender Skin: No lesions in the area of chief complaint Neurologic: Sensation intact distally Psychiatric: Patient is competent for consent with normal mood and affect Lymphatic: No axillary or cervical lymphadenopathy  MUSCULOSKELETAL: Left lower extremity - Skin incisions well appearing without drainage or erythema - Non tender medial and lateral joint lines - Knee ROM 0-120 deg flexion/extension - Motor intact with active knee flexion/extension, ankle dorsiflexion/plantarflexion - Sensation intact to light touch sural, saphenous, tibial, deep and superficial peroneal nerve distributions - 2+ DP pulse  Lab Results  Component Value Date   WBC 13.0 (H) 05/19/2024   HGB 7.6 (L) 05/19/2024   HCT 23.4 (L) 05/19/2024   MCV 74.2 (L) 05/19/2024   PLT 202 05/19/2024   BMET    Component Value Date/Time   NA 136 05/19/2024 0449   NA 136 (A) 02/27/2024 0000   K 4.4 05/19/2024 0449   CL 105 05/19/2024 0449   CO2 18 (L) 05/19/2024 0449   GLUCOSE 159 (H) 05/19/2024 0449   BUN 56 (H) 05/19/2024 0449   BUN 37 (A) 02/27/2024 0000   CREATININE 2.59 (H)  05/19/2024 0449   CALCIUM 8.5 (L) 05/19/2024 0449   EGFR 24 02/27/2024 0000   GFRNONAA 25 (L) 05/19/2024 0449     Assessment/Plan: 74 y.o. male  now 2 Days Post-Op from Principal Problem:   Septic arthritis (HCC) Active Problems:   Acute pain of left knee   Monoarticular arthritis   Acute gout .  Patient is doing well now POD 2 from left knee arthroscopic irrigation and debridement.   His CRP has continued to trend down and he has no pain in his left knee with no fevers or chills.  There is question about gout versus gout with superimposed septic arthritis.  Given elevated neutrophil count and WBCs on aspiration as well as some purulence intra op concerning for septic arthritis, we will have the patient complete a course of abx.  ID is recommending 4 weeks of doxy/cefadroxil .  He will continue eliquis  for his pre operative DVT per medicine  Weight bearing: as tolerated LLE Pain control: oxycodone  and tylenol  PT/OT DVT ppx: eliquis  Bowel regimen: docusate PO doxy and cefadroxil  per ID x 4 weeks  Dispo: Ok to discharge with 1 week follow up in my clinic from an orthopedic perspective   Trevor Kelley 05/19/2024, 11:53 AM  "

## 2024-05-19 NOTE — Plan of Care (Signed)
°  Problem: Clinical Measurements: °Goal: Ability to maintain clinical measurements within normal limits will improve °Outcome: Progressing °Goal: Diagnostic test results will improve °Outcome: Progressing °Goal: Cardiovascular complication will be avoided °Outcome: Progressing °  °

## 2024-05-19 NOTE — Progress Notes (Signed)
 Physical Therapy Treatment Patient Details Name: Trevor Kelley MRN: [id number] DOB: 08-07-1950 Today's Date: 05/19/2024   History of Present Illness 74 y.o. male presents to [facility] hospital on 05/17/2024 with L knee pain. Pt underwent arthrocentesis on 1/8 which was suggestive of septic arthritis, taken to OR on 1/8 for L knee arthroscopy, I&D and partial synovectomy. Pt also with LLE DVT. PMH includes pituitary adenoma s/p craniotomy, DMII, CKD IV, seizure.    PT Comments  Pt with significant improvements in mobility and lt knee ROM (0 to >90 degrees). Ready for dc home from PT standpoint. No follow up PT after dc recommended.    If plan is discharge home, recommend the following: Assistance with cooking/housework;Help with stairs or ramp for entrance;Assist for transportation   Can travel by private vehicle        Equipment Recommendations  BSC/3in1    Recommendations for Other Services       Precautions / Restrictions Precautions Precautions: None Restrictions Weight Bearing Restrictions Per Provider Order: Yes LLE Weight Bearing Per Provider Order: Weight bearing as tolerated     Mobility  Bed Mobility               General bed mobility comments: Pt up in bathroom    Transfers Overall transfer level: Needs assistance Equipment used: Rolling walker (2 wheels) Transfers: Sit to/from Stand Sit to Stand: Supervision           General transfer comment: Incr time and effort but no physical assist    Ambulation/Gait Ambulation/Gait assistance: Supervision Gait Distance (Feet): 150 Feet Assistive device: Rolling walker (2 wheels) Gait Pattern/deviations: Step-through pattern Gait velocity: reduced Gait velocity interpretation: 1.31 - 2.62 ft/sec, indicative of limited community ambulator   General Gait Details: Steady gait with walker. Decr lt knee flex with swing   Stairs Stairs: Yes Stairs assistance: Contact guard assist Stair Management: One rail  Left, Step to pattern, Forwards Number of Stairs: 1 General stair comments: Rail on rt and hand held on lt. Verbal cues for sequence   Wheelchair Mobility     Tilt Bed    Modified Rankin (Stroke Patients Only)       Balance Overall balance assessment: Needs assistance Sitting-balance support: No upper extremity supported, Feet supported Sitting balance-Leahy Scale: Good     Standing balance support: No upper extremity supported, During functional activity Standing balance-Leahy Scale: Fair                              Hotel Manager: No apparent difficulties  Cognition Arousal: Alert Behavior During Therapy: WFL for tasks assessed/performed   PT - Cognitive impairments: No apparent impairments                         Following commands: Intact      Cueing Cueing Techniques: Verbal cues  Exercises General Exercises - Lower Extremity Quad Sets: Left, 10 reps, Seated, AROM Long Arc Quad: AROM, Left, 10 reps, Seated Other Exercises Other Exercises: Seated knee flex x 10 reps on lt.    General Comments        Pertinent Vitals/Pain Pain Assessment Pain Assessment: No/denies pain    Home Living                          Prior Function            PT  Goals (current goals can now be found in the care plan section) Acute Rehab PT Goals Patient Stated Goal: to return to independence Progress towards PT goals: Progressing toward goals    Frequency    Min 3X/week      PT Plan      Co-evaluation              AM-PAC PT 6 Clicks Mobility   Outcome Measure  Help needed turning from your back to your side while in a flat bed without using bedrails?: None Help needed moving from lying on your back to sitting on the side of a flat bed without using bedrails?: None Help needed moving to and from a bed to a chair (including a wheelchair)?: A Little Help needed standing up from a chair using  your arms (e.g., wheelchair or bedside chair)?: A Little Help needed to walk in hospital room?: A Little Help needed climbing 3-5 steps with a railing? : A Little 6 Click Score: 20    End of Session   Activity Tolerance: Patient tolerated treatment well Patient left: in chair;with call bell/phone within reach Nurse Communication: Mobility status PT Visit Diagnosis: Other abnormalities of gait and mobility (R26.89);Muscle weakness (generalized) (M62.81)     Time: 1210-1230 PT Time Calculation (min) (ACUTE ONLY): 20 min  Charges:    $Gait Training: 8-22 mins PT General Charges $$ ACUTE PT VISIT: 1 Visit                     [facility] PT Acute Rehabilitation Services Office [phone]    [place] ORN [facility] 05/19/2024, 1:03 PM

## 2024-05-19 NOTE — Progress Notes (Addendum)
 " PROGRESS NOTE    Trevor Kelley  FMW:[id number] DOB: 1950/09/24 DOA: 05/17/2024 PCP: Theophilus Andrews, Tully GRADE, MD  Chief Complaint  Patient presents with   Fall    Brief Narrative:   Trevor Kelley is Trevor Kelley 74 y.o. male with medical history significant of panhypopituitarism, pituitary adenoma s/p craniotomy, T2DM, CKD IV, seizure disorder and other medical issues here with L knee pain.   Found to have septic arthritis, now s/p arthroscopy with irrigation and debridement with partial synovectomy 1/9.    Assessment & Plan:   Principal Problem:   Septic arthritis (HCC) Active Problems:   Acute pain of left knee   Monoarticular arthritis   Acute gout  2:54 PM Hb downtrending on anticoagulation.  Will trend.  If stable, will plan for d/c 1/11 am (will transfuse as appropriate). Folate deficiency - replace  Septic Arthritis of Left Knee Gout  Leukocytosis Sepsis ruled out S/p arthrocentesis - suggestive of septic arthritis with >60,000 WBC, 92% neutrophils - also with monosodium urate crystals suggestive of gout Blood cultures pending Synovial fluid culture so far no growth S/p L knee arthroscopy, irrigation and debridement with partial synovectomy 1/9.  Surgical cultures so far NG.  Appreciate ID consult - ? Whether this all gout.  Consider oral abx at discharge.  ID clinic follow up 1/27.  Steroid taper for gout flare.  Will need allopurinol  once things settled. 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 Watt of Urology outpatient Follows with endocrine outpatient    Anemia Follow  Seizure Disorder Continue keppra    CKD IV Baseline creatinine appears to be around 2.7 Calcitriol     Hypertension Holding amlodipine , kerendia, olmesartan, hydrochlorothiazide BP currently normal - continue to hold antihypertensives Per wife, lasix  recently discontinued   T2DM SSI Oral meds on hold  Dyslipidemia Statin on hold for now   BPH Flomax    Glaucoma Home eyedrops   Class 1 Obesity  Body mass index is 33.37 kg/m.    DVT prophylaxis: heparin  gtt Code Status: full Family Communication: wife at bedside Disposition:   Status is: Inpatient Remains inpatient appropriate because: need for continued orthopedic and ID care   Consultants:  Orthopedics Infectious disease  Procedures:   PROCEDURES PERFORMED:  1. Left knee arthroscopy irrigation and debridement with partial synovectomy  Antimicrobials:  Anti-infectives (From admission, onward)    Start     Dose/Rate Route Frequency Ordered Stop   05/20/24 1000  doxycycline  (VIBRA -TABS) tablet 100 mg        100 mg Oral Every 12 hours 05/18/24 1633 06/15/24 0959   05/19/24 1800  vancomycin  (VANCOREADY) IVPB 1250 mg/250 mL  Status:  Discontinued        1,250 mg 166.7 mL/hr over 90 Minutes Intravenous Every 48 hours 05/17/24 1857 05/18/24 1212   05/18/24 1800  cefadroxil  (DURICEF) capsule 500 mg        500 mg Oral 2 times daily 05/18/24 1633 06/15/24 0959   05/18/24 1600  cefTRIAXone  (ROCEPHIN ) 2 g in sodium chloride  0.9 % 100 mL IVPB  Status:  Discontinued       Note to Pharmacy: Retime as needed   2 g 200 mL/hr over 30 Minutes Intravenous Every 24 hours 05/17/24 1821 05/18/24 1625   05/18/24 1400  DAPTOmycin  (CUBICIN ) IVPB 700 mg/100mL premix  Status:  Discontinued        8 mg/kg  91.2 kg (Adjusted) 200 mL/hr over 30 Minutes Intravenous Every 48 hours 05/18/24 1215  05/18/24 1625   05/18/24 0600  ceFAZolin  (ANCEF ) IVPB 2g/100 mL premix  Status:  Discontinued        2 g 200 mL/hr over 30 Minutes Intravenous On call to O.R. 05/17/24 1829 05/17/24 2142   05/17/24 2200  ceFAZolin  (ANCEF ) IVPB 2g/100 mL premix  Status:  Discontinued        2 g 200 mL/hr over 30 Minutes Intravenous Every 6 hours 05/17/24 2146 05/17/24 2150   05/17/24 1630  cefTRIAXone  (ROCEPHIN ) 2 g in sodium chloride  0.9 % 100 mL IVPB        2 g 200 mL/hr over 30 Minutes Intravenous  Once 05/17/24 1615 05/17/24 1742   05/17/24 1630  vancomycin  (VANCOREADY) IVPB 2000 mg/400 mL        2,000 mg 200 mL/hr over 120 Minutes Intravenous  Once 05/17/24 1619 05/17/24 1833   05/17/24 1615  vancomycin  (VANCOREADY) IVPB 1500 mg/300 mL  Status:  Discontinued        1,500 mg 150 mL/hr over 120 Minutes Intravenous  Once 05/17/24 1615 05/17/24 1619       Subjective: No complaints  Objective: Vitals:   05/18/24 1659 05/18/24 2003 05/18/24 2349 05/19/24 0344  BP: 121/81 133/82 106/70 106/73  Pulse: 79 81 70 63  Resp:  18 18 16   Temp: 97.8 F (36.6 C) 98.2 F (36.8 C) 98.7 F (37.1 C) 98 F (36.7 C)  TempSrc: Oral Oral Oral Oral  SpO2: 100% 100% 97% 96%  Weight:      Height:        Intake/Output Summary (Last 24 hours) at 05/19/2024 0800 Last data filed at 05/19/2024 0557 Gross per 24 hour  Intake 1126.55 ml  Output 900 ml  Net 226.55 ml   Filed Weights   05/17/24 0629  Weight: 111.6 kg    Examination:  General: No acute distress. Cardiovascular: RRR Lungs: unlabored Neurological: Alert and oriented 3. Moves all extremities 4 with equal strength. Cranial nerves II through XII grossly intact. Skin: Warm and dry. No rashes or lesions. Extremities: L knee with dressing in place   Data Reviewed: I have personally reviewed following labs and imaging studies  CBC: Recent Labs  Lab 05/17/24 1003 05/18/24 0529 05/19/24 0449  WBC 14.3* 11.8* 13.0*  NEUTROABS 9.6*  --  10.9*   HGB 9.6* 9.3* 8.1*  HCT 30.1* 28.8* 24.7*  MCV 77.4* 75.4* 74.2*  PLT 181 194 202    Basic Metabolic Panel: Recent Labs  Lab 05/17/24 1003 05/18/24 0529 05/19/24 0449  NA 137 138 136  K 3.3* 4.7 4.4  CL 102 105 105  CO2 23 20* 18*  GLUCOSE 85 170* 159*  BUN 37* 40* 56*  CREATININE 3.01* 2.92* 2.59*  CALCIUM 8.9 9.0 8.5*  MG  --   --  2.4  PHOS  --   --  4.4    GFR: Estimated Creatinine Clearance: 31.8 mL/min (Trevor Kelley) (by C-G formula based on SCr of 2.59 mg/dL (H)).  Liver Function Tests: Recent Labs  Lab 05/17/24 1003 05/18/24 0529 05/19/24 0449  AST 31 51* 43*  ALT  16 36 35  ALKPHOS 61 69 87  BILITOT 1.0 0.8 0.4  PROT 6.9 7.3 6.5  ALBUMIN  3.1* 3.2* 2.8*    CBG: Recent Labs  Lab 05/17/24 2035 05/18/24 0740 05/18/24 1233 05/18/24 1702 05/18/24 2114  GLUCAP 134* 160* 184* 144* 172*     Recent Results (from the past 240 hours)  Body fluid culture w Gram Stain     Status: None (Preliminary result)   Collection Time: 05/17/24 12:39 PM   Specimen: Synovium; Body Fluid  Result Value Ref Range Status   Specimen Description SYNOVIAL  Final   Special Requests LEFT KNEE  Final   Gram Stain   Final    ABUNDANT WBC PRESENT, PREDOMINANTLY PMN NO ORGANISMS SEEN    Culture   Final    NO GROWTH < 24 HOURS Performed at [facility], [address], [place], [place] [address]    Report Status PENDING  Incomplete  Blood culture (routine x 2)     Status: None (Preliminary result)   Collection Time: 05/17/24  1:15 PM   Specimen: BLOOD LEFT ARM  Result Value Ref Range Status   Specimen Description BLOOD LEFT ARM  Final   Special Requests   Final    BOTTLES DRAWN AEROBIC AND ANAEROBIC Blood Culture adequate volume   Culture   Final    NO GROWTH < 24 HOURS Performed at [facility], [address] [place], [place] [address]    Report Status PENDING  Incomplete  Blood culture (routine x 2)     Status: None (Preliminary result)   Collection Time:  05/17/24  2:28 PM   Specimen: BLOOD  Result Value Ref Range Status   Specimen Description BLOOD SITE NOT SPECIFIED  Final   Special Requests   Final    BOTTLES DRAWN AEROBIC AND ANAEROBIC Blood Culture adequate volume   Culture   Final    NO GROWTH < 24 HOURS Performed at [facility], [address], [place], [place] [address]    Report Status PENDING  Incomplete  Aerobic/Anaerobic Culture w Gram Stain (surgical/deep wound)     Status: None (Preliminary result)   Collection Time: 05/17/24  8:06 PM   Specimen: Path fluid; Body Fluid  Result Value Ref Range Status   Specimen Description FLUID  Final   Special Requests FLUID FROM LEFT KNEE  Final   Gram Stain   Final    FEW WBC PRESENT, PREDOMINANTLY PMN NO ORGANISMS SEEN    Culture   Final    NO GROWTH < 12 HOURS Performed at [facility], [address], [place], [place] [address]    Report Status PENDING  Incomplete  Aerobic/Anaerobic Culture w Gram Stain (surgical/deep wound)     Status: None (Preliminary result)   Collection Time: 05/17/24  8:12 PM   Specimen: Path fluid; Body Fluid  Result Value Ref Range Status   Specimen Description FLUID  Final   Special Requests 2 LEFT KNEE FLUID  Final   Gram Stain   Final    RARE WBC PRESENT, PREDOMINANTLY PMN NO ORGANISMS SEEN    Culture   Final    NO GROWTH < 12 HOURS Performed at [facility], [address] [place], [place] [address]    Report Status PENDING  Incomplete         Radiology Studies: VAS US  LOWER EXTREMITY VENOUS (DVT) (7a-5p) Result Date: 05/17/2024  Lower Venous DVT Study Patient Name:  MARSHA KATHEE BOLOGNESE  Date of Exam:   05/17/2024 Medical Rec #: [id number]            Accession #:    [id number] Date of Birth: [birth year]             Patient Gender: M Patient Age:   57 years Exam Location:  [facility] Procedure:      VAS US  LOWER EXTREMITY VENOUS (DVT) Referring Phys: WARREN BARRETT  --------------------------------------------------------------------------------  Indications: Pain, and Swelling.  Limitations: Poor ultrasound/tissue interface. Comparison Study: Previous exam on 06/23/2020 was negative for DVT Performing Technologist: Ezzie Potters RVT, RDMS  Examination Guidelines: Armandina Iman complete evaluation includes B-mode imaging, spectral Doppler, color Doppler, and power Doppler as needed of all accessible portions of each vessel. Bilateral testing is considered an integral part of Trevor Kelley complete examination. Limited examinations for reoccurring indications may be performed as noted. The reflux portion of the exam is performed with the patient in reverse Trendelenburg.  +-----+---------------+---------+-----------+----------+--------------+ RIGHTCompressibilityPhasicitySpontaneityPropertiesThrombus Aging +-----+---------------+---------+-----------+----------+--------------+ CFV  Full           Yes      Yes                                 +-----+---------------+---------+-----------+----------+--------------+   +---------+---------------+---------+-----------+----------+-------------------+ LEFT     CompressibilityPhasicitySpontaneityPropertiesThrombus Aging      +---------+---------------+---------+-----------+----------+-------------------+ CFV      Full           Yes      Yes                                      +---------+---------------+---------+-----------+----------+-------------------+ SFJ      Full                                                             +---------+---------------+---------+-----------+----------+-------------------+ FV Prox  Full           Yes      Yes                                      +---------+---------------+---------+-----------+----------+-------------------+ FV Mid   Full           Yes      Yes                                      +---------+---------------+---------+-----------+----------+-------------------+ FV  DistalFull           Yes      Yes                                      +---------+---------------+---------+-----------+----------+-------------------+ PFV      Full                                                             +---------+---------------+---------+-----------+----------+-------------------+  POP      Full           Yes      Yes                                      +---------+---------------+---------+-----------+----------+-------------------+ PTV      Full                                                             +---------+---------------+---------+-----------+----------+-------------------+ PERO                                                  Not well visualized +---------+---------------+---------+-----------+----------+-------------------+ Soleal   None           No       No                   Acute               +---------+---------------+---------+-----------+----------+-------------------+    Summary: RIGHT: - No evidence of common femoral vein obstruction.   LEFT: Findings consistent with acute intramuscular thrombosis involving the left soleal veins. - There is no evidence of deep vein thrombosis in the lower extremity.  - No cystic structure found in the popliteal fossa. - Ultrasound characteristics of enlarged lymph nodes noted in the groin.  *See table(s) above for measurements and observations. Electronically signed by Debby Robertson on 05/17/2024 at 8:20:45 PM.    Final         Scheduled Meds:  acetaminophen   1,000 mg Oral Q8H   brimonidine   1 drop Both Eyes Daily   calcitRIOL   0.25 mcg Oral Daily   cefadroxil   500 mg Oral BID   docusate sodium   100 mg Oral BID   [START ON 05/20/2024] doxycycline   100 mg Oral Q12H   [START ON 05/31/2024] hydrocortisone   15 mg Oral Daily   And   [START ON 05/31/2024] hydrocortisone   10 mg Oral QPM   hydrocortisone  sod succinate (SOLU-CORTEF ) inj  50 mg Intravenous Once   insulin  aspart  0-5 Units  Subcutaneous QHS   insulin  aspart  0-9 Units Subcutaneous TID WC   latanoprost   1 drop Both Eyes QHS   levETIRAcetam   500 mg Oral BID   levothyroxine   88 mcg Oral QAC breakfast   polyethylene glycol  17 g Oral BID   predniSONE   40 mg Oral Q breakfast   Followed by   NOREEN ON 05/22/2024] predniSONE   30 mg Oral Q breakfast   Followed by   NOREEN ON 05/25/2024] predniSONE   20 mg Oral Q breakfast   Followed by   NOREEN ON 05/28/2024] predniSONE   10 mg Oral Q breakfast   tamsulosin   0.4 mg Oral Daily   timolol   1 drop Both Eyes Daily   Continuous Infusions:  sodium chloride  100 mL/hr at 05/19/24 0557   heparin  2,100 Units/hr (05/19/24 0609)     LOS: 2 days    Time spent: over 30 min     Meliton Monte, MD [facility]   To contact the attending  provider between 7A-7P or the covering provider during after hours 7P-7A, please log into the web site [email or web address] and access using universal [place] password for that web site. If you do not have the password, please call the hospital operator.  05/19/2024, 8:00 AM    "

## 2024-05-19 NOTE — Plan of Care (Signed)
" °  Problem: Clinical Measurements: Goal: Respiratory complications will improve Outcome: Completed/Met   Problem: Nutrition: Goal: Adequate nutrition will be maintained Outcome: Completed/Met   Problem: Elimination: Goal: Will not experience complications related to bowel motility Outcome: Completed/Met Goal: Will not experience complications related to urinary retention Outcome: Completed/Met   Problem: Skin Integrity: Goal: Risk for impaired skin integrity will decrease Outcome: Completed/Met   Problem: Bowel/Gastric: Goal: Gastrointestinal status for postoperative course will improve Outcome: Completed/Met   "

## 2024-05-19 NOTE — Progress Notes (Signed)
 PHARMACY - ANTICOAGULATION Pharmacy Consult for Heparin  Indication: DVT Brief A/P: Heparin  level subtherapeutic Increase Heparin  rate  Allergies[1]  Patient Measurements: Height: 6' (182.9 cm) Weight: 111.6 kg (246 lb 0.5 oz) IBW/kg (Calculated) : 77.6 HEPARIN  DW (KG): 101.4  Vital Signs: Temp: 98 F (36.7 C) (01/10 0344) Temp Source: Oral (01/10 0344) BP: 106/73 (01/10 0344) Pulse Rate: 63 (01/10 0344)  Labs: Recent Labs    05/17/24 1003 05/18/24 0529 05/18/24 1910 05/19/24 0449  HGB 9.6* 9.3*  --  8.1*  HCT 30.1* 28.8*  --  24.7*  PLT 181 194  --  202  HEPARINUNFRC  --   --  0.15* 0.21*  CREATININE 3.01* 2.92*  --  2.59*    Estimated Creatinine Clearance: 31.8 mL/min (A) (by C-G formula based on SCr of 2.59 mg/dL (H)).  Assessment: 74 y.o. male with DVT for heparin   Goal of Therapy:  Heparin  level 0.3-0.7 units/ml Monitor platelets by anticoagulation protocol: Yes   Plan:  Increase Heparin  2100 units/hr Check heparin  level in 8 hours.  Cathlyn Arrant, PharmD, BCPS            [1] No Known Allergies

## 2024-05-19 NOTE — Progress Notes (Signed)
 PHARMACY - ANTICOAGULATION Pharmacy Consult for Heparin >> eliquis  Indication: DVT  Allergies[1]  Patient Measurements: Height: 6' (182.9 cm) Weight: 111.6 kg (246 lb 0.5 oz) IBW/kg (Calculated) : 77.6 HEPARIN  DW (KG): 101.4  Vital Signs: Temp: 98 F (36.7 C) (01/10 1121) Temp Source: Oral (01/10 0809) BP: 125/75 (01/10 1121) Pulse Rate: 60 (01/10 1121)  Labs: Recent Labs    05/17/24 1003 05/18/24 0529 05/18/24 1910 05/19/24 0449 05/19/24 0832  HGB 9.6* 9.3*  --  8.1* 7.6*  HCT 30.1* 28.8*  --  24.7* 23.4*  PLT 181 194  --  202  --   HEPARINUNFRC  --   --  0.15* 0.21*  --   CREATININE 3.01* 2.92*  --  2.59*  --     Estimated Creatinine Clearance: 31.8 mL/min (A) (by C-G formula based on SCr of 2.59 mg/dL (H)).  Assessment: 74 y.o. male with DVT for heparin   Consult to transition to eliquis . CBC stable  Goal of Therapy:  Monitor platelets by anticoagulation protocol: Yes   Plan:  Stop Heparin   Start Eliquis  10mg  PO BID x 7days; then 5mg  PO BID  Elma Fail, PharmD PGY1 Clinical Pharmacist [name]  05/19/2024 12:02 PM            [1] No Known Allergies

## 2024-05-19 NOTE — Progress Notes (Signed)
 Trevor Kelley  MRN: [id number] DOB/Age: 07/15/1950 74 y.o. Physician: MARLA Pointer, M.D. 2 Days Post-Op Procedures (LRB): ARTHROSCOPY, LEFT KNEE WITH DEBRIDEMENT (Left)  Subjective: Resting comfortably this a.m.  Has been up out of bed ambulating with assistance.  Reports minimal knee pain. Vital Signs Temp:  [97.6 F (36.4 C)-98.7 F (37.1 C)] 98 F (36.7 C) (01/10 0812) Pulse Rate:  [63-81] 63 (01/10 0812) Resp:  [16-20] 17 (01/10 0812) BP: (106-133)/(70-87) 112/72 (01/10 0812) SpO2:  [96 %-100 %] 99 % (01/10 0812)  Lab Results Recent Labs    05/18/24 0529 05/19/24 0449 05/19/24 0832  WBC 11.8* 13.0*  --   HGB 9.3* 8.1* 7.6*  HCT 28.8* 24.7* 23.4*  PLT 194 202  --    BMET Recent Labs    05/18/24 0529 05/19/24 0449  NA 138 136  K 4.7 4.4  CL 105 105  CO2 20* 18*  GLUCOSE 170* 159*  BUN 40* 56*  CREATININE 2.92* 2.59*  CALCIUM 9.0 8.5*   INR  Date Value Ref Range Status  09/24/2021 1.4 (H) 0.8 - 1.2 Final    Comment:    (NOTE) INR goal varies based on device and disease states. Performed at [facility], [address] [place], [place] [address]      Exam  Left knee postop dressing clean and dry.  Able to perform straight leg raise with knee flexion to approximately 45 degrees.  Fair ankle dorsiflexion and plantarflexion mobility.  Neurovascular intact. Left knee cultures remain no growth so far  Impression:  Status post left knee arthroscopic debridement.  Question whether this represents acute gout flare versus septic arthritis.   Plan Continue empiric antibiotics per admitting service.  Range of motion of the left knee encouraged.  Continue to mobilize.   Odaliz Mcqueary M Crystalee Ventress 05/19/2024, 10:25 AM   Contact # [phone]

## 2024-05-20 ENCOUNTER — Other Ambulatory Visit (HOSPITAL_COMMUNITY): Payer: Self-pay

## 2024-05-20 DIAGNOSIS — M25562 Pain in left knee: Secondary | ICD-10-CM | POA: Diagnosis not present

## 2024-05-20 LAB — GLUCOSE, CAPILLARY
Glucose-Capillary: 133 mg/dL — ABNORMAL HIGH (ref 70–99)
Glucose-Capillary: 111 mg/dL — ABNORMAL HIGH (ref 70–99)

## 2024-05-20 LAB — HEMOGLOBIN AND HEMATOCRIT, BLOOD
HCT: 24 % — ABNORMAL LOW (ref 39.0–52.0)
Hemoglobin: 7.8 g/dL — ABNORMAL LOW (ref 13.0–17.0)

## 2024-05-20 LAB — CBC
HCT: 23.6 % — ABNORMAL LOW (ref 39.0–52.0)
Hemoglobin: 7.8 g/dL — ABNORMAL LOW (ref 13.0–17.0)
MCH: 24.6 pg — ABNORMAL LOW (ref 26.0–34.0)
MCHC: 33.1 g/dL (ref 30.0–36.0)
MCV: 74.4 fL — ABNORMAL LOW (ref 80.0–100.0)
Platelets: 201 K/uL (ref 150–400)
RBC: 3.17 MIL/uL — ABNORMAL LOW (ref 4.22–5.81)
RDW: 17.1 % — ABNORMAL HIGH (ref 11.5–15.5)
WBC: 10.2 K/uL (ref 4.0–10.5)
nRBC: 0 % (ref 0.0–0.2)

## 2024-05-20 LAB — BODY FLUID CULTURE W GRAM STAIN: Culture: NO GROWTH

## 2024-05-20 MED ORDER — APIXABAN 5 MG PO TABS: 5.0000 mg | ORAL_TABLET | Freq: Two times a day (BID) | ORAL | 0 refills | Status: DC

## 2024-05-20 MED ORDER — ACETAMINOPHEN 325 MG PO TABS
ORAL_TABLET | ORAL | 0 refills | Status: AC
  Filled 2024-05-20: qty 100, 20d supply, fill #0

## 2024-05-20 MED ORDER — OXYCODONE HCL 5 MG PO TABS
5.0000 mg | ORAL_TABLET | Freq: Four times a day (QID) | ORAL | 0 refills | Status: AC | PRN
  Filled 2024-05-20: qty 20, 5d supply, fill #0

## 2024-05-20 MED ORDER — PREDNISONE 10 MG PO TABS
ORAL_TABLET | ORAL | 0 refills | Status: AC
  Filled 2024-05-20: qty 22, 10d supply, fill #0

## 2024-05-20 MED ORDER — FOLIC ACID 1 MG PO TABS
1.0000 mg | ORAL_TABLET | Freq: Every day | ORAL | 0 refills | Status: DC
  Filled 2024-05-20: qty 90, 90d supply, fill #0

## 2024-05-20 MED ORDER — APIXABAN (ELIQUIS) VTE STARTER PACK (10MG AND 5MG)
ORAL_TABLET | ORAL | 0 refills | Status: DC
  Filled 2024-05-20: qty 74, 30d supply, fill #0

## 2024-05-20 MED ORDER — DOXYCYCLINE HYCLATE 100 MG PO TABS
100.0000 mg | ORAL_TABLET | Freq: Two times a day (BID) | ORAL | 0 refills | Status: AC
  Filled 2024-05-20: qty 56, 28d supply, fill #0

## 2024-05-20 MED ORDER — CEFADROXIL 500 MG PO CAPS
500.0000 mg | ORAL_CAPSULE | Freq: Two times a day (BID) | ORAL | 0 refills | Status: AC
  Filled 2024-05-20: qty 56, 28d supply, fill #0

## 2024-05-20 NOTE — Discharge Summary (Addendum)
 Physician Discharge Summary  Trevor Kelley FMW:[id number] DOB: 05/18/1950 DOA: 05/17/2024  PCP: Trevor Kelley, Tully GRADE, MD  Admit date: 05/17/2024 Discharge date: 05/20/2024  Time spent: 40 minutes  Recommendations for Outpatient Follow-up:  Follow outpatient CBC/CMP  Follow with ID outpatient Follow with ortho outpatient Follow eliquis  cost outpatient  Follow blood pressure outpatient - holding several agents at discharge Start allopurinol  outpatient after gout flare improved Ensure transition back to hydrocortisone  after prednisone  taper  Follow anemia, folate deficiency  Discharge Diagnoses:  Principal Problem:   Septic arthritis (HCC) Active Problems:   Acute pain of left knee   Monoarticular arthritis   Acute gout   Discharge Condition: stable  Diet recommendation: heart healthy, diabetic  Filed Weights   05/17/24 0629  Weight: 111.6 kg    History of present illness:   Trevor Kelley is Trevor Kelley 74 y.o. male with medical history significant of panhypopituitarism, pituitary adenoma s/p craniotomy, T2DM, CKD IV, seizure disorder and other medical issues here with L knee pain.    Found to have septic arthritis, now s/p arthroscopy with irrigation and debridement with partial synovectomy 1/9.  Also concern for gout flare.  Improved at time of discharge.  Going home on abx, steroids.  Also with distal LE DVT, eliquis  started.    See below for additional details   Hospital Course:  Assessment and Plan:  Septic Arthritis of Left Knee Gout  Leukocytosis Sepsis ruled out S/p arthrocentesis - suggestive of septic arthritis with >60,000 WBC, 92% neutrophils - also with monosodium urate crystals suggestive of gout Blood cultures pending Synovial fluid culture so far no growth S/p L knee arthroscopy, irrigation and debridement with partial synovectomy 1/9.  Surgical cultures so far NG.  Appreciate ID consult - ? Whether this all gout.  4 weeks oral abx at discharge.   ID clinic follow up 1/27.  Steroid taper for gout flare.  Will need allopurinol  once things settled. PT/OT, WBAT  Distal Lower Extremity DVT Acute intramuscular thrombosis involving the L soleal veins - appears provoked in setting of septic arthritis above Will treat esp in setting of recent surgical procedure, gout flare/septic arthritis above.   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 Continue synthroid , testosterone  Prednisone  for now in setting of gout flare above, transition back to hydrocortisone  once that is complete Follows with endocrine outpatient    Anemia Folate Deficiency AOCD Stable at time of d/c Supplement folate   Seizure Disorder Continue keppra    CKD IV Baseline creatinine appears to be around 2.7 Calcitriol     Hypertension resume amlodipine  Hold kerendia, olmesartan, hydrochlorothiazide and follow up with outpatient providers - BP currently ok Per wife, lasix  recently discontinued   T2DM Resume oral meds at discharge  Dyslipidemia Statin    BPH Flomax    Glaucoma Home eyedrops   Class 1 Obesity  Body mass index is 33.37 kg/m.    Procedures: PROCEDURES PERFORMED:  1. Left knee arthroscopy irrigation and debridement with partial synovectomy   Consultations: Ortho ID  Discharge Exam: Vitals:   05/20/24 0725 05/20/24 1142  BP: 113/76 116/81  Pulse: (!) 52 (!) 58  Resp: 18 18  Temp: 98 F (36.7 C) 98 F (36.7 C)  SpO2: 98% 94%   No complaints Eager to discharge  General: No acute distress. Cardiovascular: RRR Lungs: unlabored Neurological:  Alert and oriented 3. Moves all extremities 4. Cranial nerves II through XII grossly intact. Extremities: mild LLE edema, limited exam of L knee, wearing  pants  Discharge Instructions   Discharge Instructions     Call MD for:  difficulty breathing, headache or visual disturbances   Complete by: As directed    Call MD for:  extreme fatigue   Complete by: As directed    Call MD for:  hives   Complete by: As directed    Call MD for:  persistant dizziness or light-headedness   Complete by: As directed    Call MD for:  persistant nausea and vomiting   Complete by: As directed    Call MD for:  redness, tenderness, or signs of infection (pain, swelling, redness, odor or green/yellow discharge around incision site)   Complete by: As directed    Call MD for:  severe uncontrolled pain   Complete by: As directed    Call MD for:  temperature >100.4   Complete by: As directed    Discharge instructions   Complete by: As directed    You were seen for inflammation of your left leg.  We think this was due to infection and gout.    You had Trevor Kelley surgical procedure and have been started on antibiotics.  You'll continue antibiotics for 4 weeks.  You'll need to follow up with orthopedics and infectious disease as an outpatient.    For your gout, we started you on Trevor Kelley prednisone  taper.  Follow up with your outpatient doctor and discuss starting allopurinol  to help prevent future gout flares.  You had Trevor Kelley blood clot in your left leg.  You'll need at least 3 months of Trevor Kelley blood thinner.  Follow up with your PCP to discuss the duration of treatment.  Eliquis  cost maybe prohibitively expensive, please follow up with your PCP to discuss this within the next week so they can plan what blood thinner to use when you need refills in Trevor Kelley month.  You have folate deficiency.  We've started you on supplementation.   I've held several of your blood pressure medicines because your blood pressure is ok at discharge.  Ask your PCP or kidney doctor if you should resume these as an outpatient.  Return for new, recurrent, or worsening symptoms.  Please ask your PCP to request  records from this hospitalization so they know what was done and what the next steps will be.   Discharge wound care:   Complete by: As directed    Per orthopedics   Increase activity slowly   Complete by: As directed       Allergies as of 05/20/2024   No Known Allergies      Medication List     PAUSE taking these medications    Kerendia 10 MG Tabs Wait to take this until your doctor or other care provider tells you to start again. Follow up with your kidney doctor or PCP before resuming this medicine Generic drug: Finerenone Take by mouth.   olmesartan-hydrochlorothiazide 20-12.5 MG tablet Wait to take this until your doctor or other care provider tells you to start again. Your blood pressure is ok without this in the hospital.  Follow up with your PCP and kidney doctor outpatient before resuming this medicine. Commonly known as: BENICAR HCT Take 1 tablet by mouth daily.       STOP taking these medications    insulin  degludec 100 UNIT/ML FlexTouch Pen Commonly known as: TRESIBA   TAKE these medications    acetaminophen  325 MG tablet Commonly known as: TYLENOL  Take 3 tablets (975 mg total) by mouth every 8 (eight) hours for 10 days, THEN 2 tablets (650 mg total) every 6 (six) hours as needed for up to 10 days for mild pain (pain score 1-3), fever or headache. Start taking on: May 20, 2024   amLODipine  10 MG tablet Commonly known as: NORVASC  Take 10 mg by mouth daily.   Apixaban  Starter Pack (10mg  and 5mg ) Commonly known as: ELIQUIS  STARTER PACK Take as directed on package: start with two-5mg  tablets twice daily for 7 days. On day 8, switch to one-5mg  tablet twice daily.   apixaban  5 MG Tabs tablet Commonly known as: ELIQUIS  Take 1 tablet (5 mg total) by mouth 2 (two) times daily. Start this after your starter pack is complete on 06/20/2024. Start taking on: June 20, 2024   bimatoprost 0.01 % Soln Commonly known as: LUMIGAN Place 1 drop into  both eyes at bedtime.   brimonidine  0.1 % Soln Commonly known as: ALPHAGAN  P Place 1 drop into both eyes daily.   calcitRIOL  0.25 MCG capsule Commonly known as: ROCALTROL  Take 0.25 mcg by mouth daily.   cefadroxil  500 MG capsule Commonly known as: DURICEF Take 1 capsule (500 mg total) by mouth 2 (two) times daily for 28 days.   doxycycline  100 MG tablet Commonly known as: VIBRA -TABS Take 1 tablet (100 mg total) by mouth every 12 (twelve) hours for 28 days.   folic acid  1 MG tablet Commonly known as: FOLVITE  Take 1 tablet (1 mg total) by mouth daily. Follow your low folate levels with your outpatient provider. Start taking on: May 21, 2024   hydrocortisone  10 MG tablet Commonly known as: CORTEF  Take 10-15 mg by mouth See admin instructions. Take 15mg  by mouth every morning and take 10mg  by mouth tablet in the evening.   levETIRAcetam  500 MG tablet Commonly known as: KEPPRA  Take 1 tablet (500 mg total) by mouth 2 (two) times daily.   levothyroxine  88 MCG tablet Commonly known as: SYNTHROID  Take 88 mcg by mouth daily before breakfast.   oxyCODONE  5 MG immediate release tablet Commonly known as: Oxy IR/ROXICODONE  Take 1 tablet (5 mg total) by mouth every 6 (six) hours as needed for moderate pain (pain score 4-6).   predniSONE  10 MG tablet Commonly known as: DELTASONE  Take 4 tablets (40 mg total) by mouth daily with breakfast for 1 day, THEN 3 tablets (30 mg total) daily with breakfast for 3 days, THEN 2 tablets (20 mg total) daily with breakfast for 3 days, THEN 1 tablet (10 mg total) daily with breakfast for 3 days. Talk to your PCP about starting allopurinol  as an outpatient.  Resume your hydrocortisone  as previously prescribed when you complete this prescription. Start taking on: May 21, 2024   simvastatin  20 MG tablet Commonly known as: ZOCOR  Take 20 mg by mouth daily at 6 PM.   tamsulosin  0.4 MG Caps capsule Commonly known as: FLOMAX  Take 0.4 mg by mouth  daily.   TESTOSTERONE  COMPOUNDING KIT TD Place 1 mL onto the skin every morning.   timolol  0.5 % ophthalmic solution Commonly known as: TIMOPTIC  Place 1 drop into both eyes daily.   VITAMIN D  PO Take 1 tablet by mouth daily.               Durable Medical Equipment  (From admission, onward)           Start  Ordered   05/19/24 1243  For home use only DME Bedside commode  Once       Question:  Patient needs Aritha Huckeba bedside commode to treat with the following condition  Answer:  Weakness   05/19/24 1242              Discharge Care Instructions  (From admission, onward)           Start     Ordered   05/20/24 0000  Discharge wound care:       Comments: Per orthopedics   05/20/24 1121           Allergies[1]  Follow-up Information     Teresa Rankin ORN, MD Follow up in 1 week(s).   Specialty: Orthopedic Surgery Contact information: [address] [place] [address] [phone]         Overton Constance DASEN, MD Follow up.   Specialty: Infectious Diseases Why: The ID clinic will call you.  If you don't get Lulamae Skorupski phone call with regards to an appointment in [place] week, call to schedule. Contact information: [address] [place] [address] [phone]         Trevor Kelley, Tully GRADE, MD Follow up.   Specialty: Internal Medicine Why: call for Nikita Humble follow up appointment. Contact information: [address] [place] [address] [phone]                  The results of significant diagnostics from this hospitalization (including imaging, microbiology, ancillary and laboratory) are listed below for reference.    Significant Diagnostic Studies: VAS US  LOWER EXTREMITY VENOUS (DVT) (7a-5p) Result Date: 05/17/2024  Lower Venous DVT Study Patient Name:  Trevor Kelley  Date of Exam:   05/17/2024 Medical Rec #: [id number]            Accession #:    [id number] Date of Birth: 1950-10-01             Patient Gender: M  Patient Age:   74 years Exam Location:  [facility] Procedure:      VAS US  LOWER EXTREMITY VENOUS (DVT) Referring Phys: WARREN BARRETT --------------------------------------------------------------------------------  Indications: Pain, and Swelling.  Limitations: Poor ultrasound/tissue interface. Comparison Study: Previous exam on 06/23/2020 was negative for DVT Performing Technologist: Ezzie Potters RVT, RDMS  Examination Guidelines: Zarina Pe complete evaluation includes B-mode imaging, spectral Doppler, color Doppler, and power Doppler as needed of all accessible portions of each vessel. Bilateral testing is considered an integral part of Quamere Mussell complete examination. Limited examinations for reoccurring indications may be performed as noted. The reflux portion of the exam is performed with the patient in reverse Trendelenburg.  +-----+---------------+---------+-----------+----------+--------------+ RIGHTCompressibilityPhasicitySpontaneityPropertiesThrombus Aging +-----+---------------+---------+-----------+----------+--------------+ CFV  Full           Yes      Yes                                 +-----+---------------+---------+-----------+----------+--------------+   +---------+---------------+---------+-----------+----------+-------------------+ LEFT     CompressibilityPhasicitySpontaneityPropertiesThrombus Aging      +---------+---------------+---------+-----------+----------+-------------------+ CFV      Full           Yes      Yes                                      +---------+---------------+---------+-----------+----------+-------------------+ SFJ  Full                                                             +---------+---------------+---------+-----------+----------+-------------------+ FV Prox  Full           Yes      Yes                                      +---------+---------------+---------+-----------+----------+-------------------+ FV Mid   Full            Yes      Yes                                      +---------+---------------+---------+-----------+----------+-------------------+ FV DistalFull           Yes      Yes                                      +---------+---------------+---------+-----------+----------+-------------------+ PFV      Full                                                             +---------+---------------+---------+-----------+----------+-------------------+ POP      Full           Yes      Yes                                      +---------+---------------+---------+-----------+----------+-------------------+ PTV      Full                                                             +---------+---------------+---------+-----------+----------+-------------------+ PERO                                                  Not well visualized +---------+---------------+---------+-----------+----------+-------------------+ Soleal   None           No       No                   Acute               +---------+---------------+---------+-----------+----------+-------------------+    Summary: RIGHT: - No evidence of common femoral vein obstruction.   LEFT: Findings consistent with acute intramuscular thrombosis involving the left soleal veins. - There is no evidence of deep vein thrombosis in the lower extremity.  - No cystic structure found in the popliteal fossa. - Ultrasound characteristics  of enlarged lymph nodes noted in the groin.  *See table(s) above for measurements and observations. Electronically signed by Debby Robertson on 05/17/2024 at 8:20:45 PM.    Final    DG Ankle Complete Left Result Date: 05/17/2024 CLINICAL DATA:  Fall.  Pain. EXAM: LEFT ANKLE COMPLETE - 3+ VIEW COMPARISON:  No comparison studies available. FINDINGS: No evidence for an acute fracture or dislocation. Ankle mortise is preserved. Tiny fragments adjacent to the tip of the lateral malleolus are consistent with age  indeterminate avulsion injury. Soft tissue swelling evident. IMPRESSION: Tiny fragments adjacent to the tip of the lateral malleolus are consistent with age indeterminate avulsion injury. Otherwise no acute bony findings. Electronically Signed   By: Camellia Candle M.D.   On: 05/17/2024 07:31   DG Knee Complete 4 Views Left Result Date: 05/17/2024 CLINICAL DATA:  Fall.  Knee pain. EXAM: LEFT KNEE - COMPLETE 4+ VIEW COMPARISON:  None Available. FINDINGS: No evidence for an acute fracture. No subluxation or dislocation. No worrisome lytic or sclerotic osseous abnormality. Joint effusion evident. IMPRESSION: Joint effusion without acute bony findings. Electronically Signed   By: Camellia Candle M.D.   On: 05/17/2024 07:30    Microbiology: Recent Results (from the past 240 hours)  Body fluid culture w Gram Stain     Status: None (Preliminary result)   Collection Time: 05/17/24 12:39 PM   Specimen: Synovium; Body Fluid  Result Value Ref Range Status   Specimen Description SYNOVIAL  Final   Special Requests LEFT KNEE  Final   Gram Stain   Final    ABUNDANT WBC PRESENT, PREDOMINANTLY PMN NO ORGANISMS SEEN    Culture   Final    NO GROWTH 2 DAYS Performed at [facility], [address], [place], [place] [address]    Report Status PENDING  Incomplete  Blood culture (routine x 2)     Status: None (Preliminary result)   Collection Time: 05/17/24  1:15 PM   Specimen: BLOOD LEFT ARM  Result Value Ref Range Status   Specimen Description BLOOD LEFT ARM  Final   Special Requests   Final    BOTTLES DRAWN AEROBIC AND ANAEROBIC Blood Culture adequate volume   Culture   Final    NO GROWTH 3 DAYS Performed at [facility], [address] [place], [place] [address]    Report Status PENDING  Incomplete  Blood culture (routine x 2)     Status: None (Preliminary result)   Collection Time: 05/17/24  2:28 PM   Specimen: BLOOD  Result Value Ref Range Status   Specimen Description BLOOD SITE NOT  SPECIFIED  Final   Special Requests   Final    BOTTLES DRAWN AEROBIC AND ANAEROBIC Blood Culture adequate volume   Culture   Final    NO GROWTH 3 DAYS Performed at [facility], [address] [place], [place] [address]    Report Status PENDING  Incomplete  Aerobic/Anaerobic Culture w Gram Stain (surgical/deep wound)     Status: None (Preliminary result)   Collection Time: 05/17/24  8:06 PM   Specimen: Path fluid; Body Fluid  Result Value Ref Range Status   Specimen Description FLUID  Final   Special Requests FLUID FROM LEFT KNEE  Final   Gram Stain   Final    FEW WBC PRESENT, PREDOMINANTLY PMN NO ORGANISMS SEEN    Culture   Final    NO GROWTH 3 DAYS NO ANAEROBES ISOLATED; CULTURE IN PROGRESS FOR  5 DAYS Performed at [facility], [address] [place], [place] [address]    Report Status PENDING  Incomplete  Aerobic/Anaerobic Culture w Gram Stain (surgical/deep wound)     Status: None (Preliminary result)   Collection Time: 05/17/24  8:12 PM   Specimen: Path fluid; Body Fluid  Result Value Ref Range Status   Specimen Description FLUID  Final   Special Requests 2 LEFT KNEE FLUID  Final   Gram Stain   Final    RARE WBC PRESENT, PREDOMINANTLY PMN NO ORGANISMS SEEN    Culture   Final    NO GROWTH 3 DAYS NO ANAEROBES ISOLATED; CULTURE IN PROGRESS FOR 5 DAYS Performed at [facility], [address] [place], [place] [address]    Report Status PENDING  Incomplete     Labs: Basic Metabolic Panel: Recent Labs  Lab 05/17/24 1003 05/18/24 0529 05/19/24 0449  NA 137 138 136  K 3.3* 4.7 4.4  CL 102 105 105  CO2 23 20* 18*  GLUCOSE 85 170* 159*  BUN 37* 40* 56*  CREATININE 3.01* 2.92* 2.59*  CALCIUM 8.9 9.0 8.5*  MG  --   --  2.4  PHOS  --   --  4.4   Liver Function Tests: Recent Labs  Lab 05/17/24 1003 05/18/24 0529 05/19/24 0449  AST 31 51* 43*  ALT 16 36 35  ALKPHOS 61 69 87  BILITOT 1.0 0.8 0.4  PROT 6.9 7.3 6.5  ALBUMIN  3.1* 3.2* 2.8*    No results for input(s): LIPASE, AMYLASE in the last 168 hours. No results for input(s): AMMONIA in the last 168 hours. CBC: Recent Labs  Lab 05/17/24 1003 05/18/24 0529 05/19/24 0449 05/19/24 0832 05/19/24 1426 05/20/24 0531 05/20/24 1048  WBC 14.3* 11.8* 13.0*  --   --  10.2  --   NEUTROABS 9.6*  --  10.9*  --   --   --   --   HGB 9.6* 9.3* 8.1* 7.6* 9.2* 7.8* 7.8*  HCT 30.1* 28.8* 24.7* 23.4* 29.1* 23.6* 24.0*  MCV 77.4* 75.4* 74.2*  --   --  74.4*  --   PLT 181 194 202  --   --  201  --    Cardiac Enzymes: No results for input(s): CKTOTAL, CKMB, CKMBINDEX, TROPONINI in the last 168 hours. BNP: BNP (last 3 results) No results for input(s): BNP in the last 8760 hours.  ProBNP (last 3 results) No results for input(s): PROBNP in the last 8760 hours.  CBG: Recent Labs  Lab 05/19/24 1122 05/19/24 1544 05/19/24 2130 05/20/24 0721 05/20/24 1140  GLUCAP 130* 123* 161* 133* 111*       Signed:  Meliton Monte MD.  [facility] 05/20/2024, 11:46 AM       [1] No Known Allergies

## 2024-05-20 NOTE — Plan of Care (Signed)
  Problem: Education: Goal: Knowledge of General Education information will improve Description: Including pain rating scale, medication(s)/side effects and non-pharmacologic comfort measures Outcome: Progressing   Problem: Clinical Measurements: Goal: Diagnostic test results will improve Outcome: Progressing   Problem: Activity: Goal: Risk for activity intolerance will decrease Outcome: Progressing   Problem: Coping: Goal: Level of anxiety will decrease Outcome: Progressing

## 2024-05-20 NOTE — Plan of Care (Signed)
  Problem: Health Behavior/Discharge Planning: Goal: Ability to manage health-related needs will improve Outcome: Adequate for Discharge   

## 2024-05-21 ENCOUNTER — Telehealth: Payer: Self-pay

## 2024-05-21 ENCOUNTER — Other Ambulatory Visit (HOSPITAL_COMMUNITY): Payer: Self-pay

## 2024-05-21 NOTE — Transitions of Care (Post Inpatient/ED Visit) (Signed)
 "  05/21/2024  Name: Trevor Kelley MRN: [id number] DOB: 1951-02-07  Today's TOC FU Call Status: Today's TOC FU Call Status:: Successful TOC FU Call Completed TOC FU Call Complete Date: 05/21/24  Patient's Name and Date of Birth confirmed. Name, DOB (talk with wife - Jame Morrell)  Transition Care Management Follow-up Telephone Call Date of Discharge: 05/20/24 Discharge Facility: [name] [place]) Type of Discharge: Inpatient Admission Primary Inpatient Discharge Diagnosis:: Septic arthritis How have you been since you were released from the hospital?: Better (sluggish) Any questions or concerns?: No  Items Reviewed: Did you receive and understand the discharge instructions provided?: Yes Medications obtained,verified, and reconciled?: Yes (Medications Reviewed) Any new allergies since your discharge?: No Dietary orders reviewed?: No Do you have support at home?: Yes People in Home [RPT]: spouse Name of Support/Comfort Primary Source: Debra  Medications Reviewed Today: Medications Reviewed Today     Reviewed by Rumalda Alan PENNER, RN (Registered Nurse) on 05/21/24 at 1413  Med List Status: <None>   Medication Order Taking? Sig Documenting Provider Last Dose Status Informant  acetaminophen  (TYLENOL ) 325 MG tablet [id number] Yes Take 3 tablets (975 mg total) by mouth every 8 (eight) hours for 10 days, THEN 2 tablets (650 mg total) every 6 (six) hours as needed for up to 10 days for mild pain (pain score 1-3), fever or headache. Perri DELENA Meliton Mickey., MD  Active   amLODipine  (NORVASC ) 10 MG tablet [id number] Yes Take 10 mg by mouth daily. [provider]  Active Spouse/Significant Other, Pharmacy Records  apixaban  (ELIQUIS ) 5 MG TABS tablet [id number]  Take 1 tablet (5 mg total) by mouth 2 (two) times daily. Start this after your starter pack is complete on 06/20/2024.  Patient not taking: Reported on 05/21/2024   Perri DELENA Meliton Mickey., MD  Active   APIXABAN  (ELIQUIS ) VTE  STARTER PACK (10MG  AND 5MG ) [id number] Yes Take as directed on package: start with two-5mg  tablets twice daily for 7 days. On day 8, switch to one-5mg  tablet twice daily. Perri DELENA Meliton Mickey., MD  Active   bimatoprost (LUMIGAN) 0.01 % SOLN [id number] Yes Place 1 drop into both eyes at bedtime. [provider]  Active Spouse/Significant Other  brimonidine  (ALPHAGAN  P) 0.1 % SOLN [id number] Yes Place 1 drop into both eyes daily. [provider]  Active Spouse/Significant Other  calcitRIOL  (ROCALTROL ) 0.25 MCG capsule [id number] Yes Take 0.25 mcg by mouth daily. [provider]  Active Spouse/Significant Other, Pharmacy Records  cefadroxil  (DURICEF) 500 MG capsule [id number] Yes Take 1 capsule (500 mg total) by mouth 2 (two) times daily for 28 days. Perri DELENA Meliton Mickey., MD  Active   doxycycline  (VIBRA -TABS) 100 MG tablet [id number] Yes Take 1 tablet (100 mg total) by mouth every 12 (twelve) hours for 28 days. Perri DELENA Meliton Mickey., MD  Active   Finerenone [facility]) 10 MG TABS [id number]  Take by mouth.  Patient not taking: Reported on 05/21/2024   [provider]  Active Spouse/Significant Other, Pharmacy Records  folic acid  (FOLVITE ) 1 MG tablet [id number] Yes Take 1 tablet (1 mg total) by mouth daily. Follow your low folate levels with your outpatient provider. Perri DELENA Meliton Mickey., MD  Active   hydrocortisone  (CORTEF ) 10 MG tablet [id number]  Take 10-15 mg by mouth See admin instructions. Take 15mg  by mouth every morning and take 10mg  by mouth tablet in the evening.  Patient not taking: Reported on 05/21/2024   [provider]  Active Spouse/Significant Other, Pharmacy Records  levETIRAcetam  (KEPPRA ) 500 MG tablet [id number] Yes Take 1 tablet (500 mg total) by mouth 2 (two) times daily. Gayland Lauraine PARAS, NP  Active Spouse/Significant Other, Pharmacy Records  levothyroxine  (SYNTHROID , LEVOTHROID) 88 MCG tablet [id number] Yes Take 88 mcg by mouth daily before  breakfast. [provider]  Active Spouse/Significant Other, Pharmacy Records  olmesartan-hydrochlorothiazide (BENICAR HCT) 20-12.5 MG tablet [id number]  Take 1 tablet by mouth daily.  Patient not taking: Reported on 05/21/2024   [provider]  Active Spouse/Significant Other, Pharmacy Records  oxyCODONE  (OXY IR/ROXICODONE ) 5 MG immediate release tablet [id number]  Take 1 tablet (5 mg total) by mouth every 6 (six) hours as needed for moderate pain (pain score 4-6).  Patient not taking: Reported on 05/21/2024   Perri DELENA Meliton Mickey., MD  Active   predniSONE  (DELTASONE ) 10 MG tablet [id number] Yes Take 4 tablets (40 mg total) by mouth daily with breakfast for 1 day, THEN 3 tablets (30 mg total) daily with breakfast for 3 days, THEN 2 tablets (20 mg total) daily with breakfast for 3 days, THEN 1 tablet (10 mg total) daily with breakfast for 3 days. Talk to your PCP about starting allopurinol  as an outpatient.  Resume your hydrocortisone  as previously prescribed when you complete this prescription. Perri DELENA Meliton Mickey., MD  Active   simvastatin  (ZOCOR ) 20 MG tablet [id number] Yes Take 20 mg by mouth daily at 6 PM.  [provider]  Active Spouse/Significant Other, Pharmacy Records  tamsulosin  (FLOMAX ) 0.4 MG CAPS capsule [id number] Yes Take 0.4 mg by mouth daily.  [provider]  Active Spouse/Significant Other, Pharmacy Records  TESTOSTERONE  COMPOUNDING KIT TD [id number] Yes Place 1 mL onto the skin every morning. [provider]  Active Spouse/Significant Other, Pharmacy Records  timolol  (TIMOPTIC ) 0.5 % ophthalmic solution [id number] Yes Place 1 drop into both eyes daily.  [provider]  Active Spouse/Significant Other, Pharmacy Records           Med Note LEOBARDO SCHUYLER RAMAN   Sat Jun 05, 2022  1:30 PM)    VITAMIN D  PO [id number] Yes Take 1 tablet by mouth daily. [provider]  Active Spouse/Significant Other, Pharmacy Records            Home  Care and Equipment/Supplies: Were Home Health Services Ordered?: No Any new equipment or medical supplies ordered?: Yes Name of Medical supply agency?: [facility] - given in the hospital - no Were you able to get the equipment/medical supplies?: Yes Do you have any questions related to the use of the equipment/supplies?: No  Functional Questionnaire: Do you need assistance with bathing/showering or dressing?: No Do you need assistance with meal preparation?: Yes (wife) Do you need assistance with eating?: No Do you have difficulty maintaining continence: Yes Do you need assistance with getting out of bed/getting out of a chair/moving?: No (using a walker) Do you have difficulty managing or taking your medications?: Yes (wife)  Follow up appointments reviewed: PCP Follow-up appointment confirmed?: No (call today and schedule) MD Provider Line [phone] Given: No Specialist Hospital Follow-up appointment confirmed?: Yes Date of Specialist follow-up appointment?: 06/05/24 Follow-Up Specialty Provider:: ID Do you need transportation to your follow-up appointment?: No Do you understand care options if your condition(s) worsen?: Yes-patient verbalized understanding  SDOH Interventions Today    Flowsheet Row Most Recent Value  SDOH Interventions   Food Insecurity Interventions Intervention Not Indicated  Housing Interventions Intervention Not Indicated  Transportation Interventions Intervention Not Indicated  Utilities Interventions Intervention Not Indicated  Spoke with patient who requested I speak with wife. Reviewed medications. Wife reports missing 8 prednisone  tablets. Spoke with pharmacist at [facility] pharmacy and meds will be replaced. Wife to call the [facility] pharmacy when she wants to pick them up.  Reviewed signs of infection. Reviewed importance of timely follow up with PCP and ortho. Wife to call today. Reviewed importance of taking all antibiotics as prescribed even if patient is  feeling better.                                                           Goals Addressed             This Visit's Progress    [facility] Transitions of Care (TOC) Care Plan       Problems:  Recent Hospitalization for treatment of septic arthritis left knee.  Medication access barrier ? Cost eliquis   No Hospital Follow Up Provider appointment PCP and ortho  Goal:  Over the next 30 days, the patient will not experience hospital readmission  Interventions:  Transitions of Care: Doctor Visits  - discussed the importance of doctor visits Post discharge activity limitations prescribed by provider reviewed Post-op wound/incision care reviewed with patient/caregiver Reviewed Signs and symptoms of infection Reviewed TOC program and wife has consented. Next call scheduled TOC note sent to PCP Provided my contact information to wife Placed call to [facility] pharmacy to inquire about medications that were missing. Pharmacy to dispense 8 additional prednisone  tablets.  Wife to call pharmacy when she is ready to pick up. Ensured that wife has phone number.  Reviewed wife's concern for cost of eliquis  moving forward. It was recommended for wife to discuss with PCP. Encouraged wife to discuss with PCP at hospital follow up appointment.   Patient Self Care Activities:  Attend all scheduled provider appointments Call pharmacy for medication refills 3-7 days in advance of running out of medications Call provider office for new concerns or questions  Notify RN Care Manager of [facility] call rescheduling needs Participate in Transition of Care Program/Attend TOC scheduled calls Take medications as prescribed    Plan:  Telephone follow up appointment with care management team member scheduled for:  05/31/2024 The patient has been provided with contact information for the care management team and has been advised to call with any health related questions or concerns.         Alan Ee, RN, BSN,  CEN Population Health- Transition of Care Team.  [facility] [phone]  "

## 2024-05-22 ENCOUNTER — Other Ambulatory Visit (HOSPITAL_COMMUNITY): Payer: Self-pay

## 2024-05-22 LAB — AEROBIC/ANAEROBIC CULTURE W GRAM STAIN (SURGICAL/DEEP WOUND)
Culture: NO GROWTH
Culture: NO GROWTH
Special Requests: 2

## 2024-05-22 LAB — CULTURE, BLOOD (ROUTINE X 2)
Culture: NO GROWTH
Culture: NO GROWTH
Special Requests: ADEQUATE
Special Requests: ADEQUATE

## 2024-05-24 ENCOUNTER — Encounter: Payer: Self-pay | Admitting: Internal Medicine

## 2024-05-24 ENCOUNTER — Ambulatory Visit: Admitting: Internal Medicine

## 2024-05-24 VITALS — BP 130/88 | HR 70 | Temp 98.1°F | Wt 251.1 lb

## 2024-05-24 DIAGNOSIS — G4733 Obstructive sleep apnea (adult) (pediatric): Secondary | ICD-10-CM

## 2024-05-24 DIAGNOSIS — M10162 Lead-induced gout, left knee: Secondary | ICD-10-CM | POA: Diagnosis not present

## 2024-05-24 DIAGNOSIS — M009 Pyogenic arthritis, unspecified: Secondary | ICD-10-CM | POA: Diagnosis not present

## 2024-05-24 DIAGNOSIS — Z23 Encounter for immunization: Secondary | ICD-10-CM | POA: Diagnosis not present

## 2024-05-24 DIAGNOSIS — I824Z2 Acute embolism and thrombosis of unspecified deep veins of left distal lower extremity: Secondary | ICD-10-CM | POA: Diagnosis not present

## 2024-05-24 DIAGNOSIS — N1832 Chronic kidney disease, stage 3b: Secondary | ICD-10-CM

## 2024-05-24 DIAGNOSIS — I10 Essential (primary) hypertension: Secondary | ICD-10-CM

## 2024-05-24 DIAGNOSIS — T560X1D Toxic effect of lead and its compounds, accidental (unintentional), subsequent encounter: Secondary | ICD-10-CM

## 2024-05-24 DIAGNOSIS — E893 Postprocedural hypopituitarism: Secondary | ICD-10-CM

## 2024-05-24 DIAGNOSIS — Z09 Encounter for follow-up examination after completed treatment for conditions other than malignant neoplasm: Secondary | ICD-10-CM

## 2024-05-24 MED ORDER — FOLIC ACID 1 MG PO TABS: 1.0000 mg | ORAL_TABLET | Freq: Every day | ORAL | 0 refills | Status: AC

## 2024-05-24 MED ORDER — APIXABAN 5 MG PO TABS: 5.0000 mg | ORAL_TABLET | Freq: Two times a day (BID) | ORAL | Status: AC

## 2024-05-24 MED ORDER — ALLOPURINOL 100 MG PO TABS: 100.0000 mg | ORAL_TABLET | Freq: Every day | ORAL | 6 refills | Status: AC

## 2024-05-24 NOTE — Progress Notes (Signed)
 "    Hospital follow-up visit     CC/Reason for Visit: Hospitalization follow-up  HPI: Trevor Kelley is a 74 y.o. male who is coming in today for the above mentioned reasons, specifically transitional care services face-to-face visit.    Dates hospitalized: 05/17/2024-05/20/2024 Days since discharge from hospital: 4 Patient was discharged from the hospital to: Home Reason for admission to hospital: Septic arthritis of the left knee Date of interactive phone contact with patient and/or caregiver: 05/21/2024  I have reviewed in detail patient's discharge summary plus pertinent specific notes, labs, and images from the hospitalization.  Yes  Admitted with pain and swelling of the left knee.  After arthrocentesis found to have septic arthritis and went to the OR for drainage.  Was placed on IV antibiotics.  Subsequently developed left lower extremity DVT and placed on Eliquis .  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  His Benicar and Darice Lais and amlodipine  were placed on hold due to low normal blood pressures.  Will be on doxycycline  for 28 days.  Has follow-up scheduled with orthopedics and with ID.  Medication reconciliation was done today and patient is taking meds as recommended by discharging hospitalist/specialist.  Yes   Past Medical/Surgical History: Past Medical History:  Diagnosis Date   CKD (chronic kidney disease)    Diabetes mellitus without complication (HCC)    DM (diabetes mellitus), type 2 (HCC)    History of cerebral meningioma    History of pituitary adenoma    Hypertension    Hypothyroidism    OSA (obstructive sleep apnea)    Panhypopituitarism    Seizure disorder (HCC) 05/31/2017    Past Surgical History:  Procedure Laterality Date   BRAIN SURGERY     KNEE ARTHROSCOPY W/ DEBRIDEMENT Left 05/17/2024   Procedure: ARTHROSCOPY, LEFT KNEE WITH  DEBRIDEMENT;  Surgeon: Teresa Rankin ORN, MD;  Location: [facility] OR;  Service: Orthopedics;  Laterality: Left;  IRRIGATION AND DEBRIDEMENT WITH ARTHROSCOPY LEFT KNEE    Social History:  reports that he has never smoked. He has never used smokeless tobacco. He reports that he does not drink alcohol and does not use drugs.  Allergies: Allergies[1]  Family History:  Family History  Problem Relation Age of Onset   Cancer - Colon Mother    Hypothyroidism Other    Diabetes Mellitus II Other    Cancer Other    Hypertension Other     Current Medications[2]  Review of Systems:  Negative except as mentioned in HPI.    Physical Exam: Vitals:   05/24/24 0921  BP: 130/88  Pulse: 70  Temp: 98.1 F (36.7 C)  TempSrc: Oral  SpO2: 99%  Weight: 251 lb 1.6 oz (113.9 kg)    Body mass index is 34.06 kg/m.   Physical Exam Vitals reviewed.  Constitutional:      Appearance: Normal appearance.  HENT:     Head: Normocephalic and atraumatic.  Eyes:     Conjunctiva/sclera: Conjunctivae normal.  Cardiovascular:     Rate and Rhythm: Normal rate and regular rhythm.  Pulmonary:     Effort: Pulmonary effort is normal.     Breath sounds: Normal breath sounds.  Musculoskeletal:     Right lower leg: Normal.     Left lower leg: Swelling present.  Skin:    General: Skin is warm and dry.  Neurological:     General: No  focal deficit present.     Mental Status: He is alert and oriented to person, place, and time.  Psychiatric:        Mood and Affect: Mood normal.        Behavior: Behavior normal.        Thought Content: Thought content normal.        Judgment: Judgment normal.     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 - Refills will be given for folic acid  and allopurinol . - Blood pressure continues to elevate since hospital discharge the 130-140 over high 80s.  Plan to resume amlodipine  and Kerendia.  Hold off on Benicar HCT for now. - Samples given for Eliquis , and have sent referral for our inpatient pharmacist to help with Eliquis  prescription. - Have made sure that they have follow-up scheduled both with orthopedics this afternoon and with infectious diseases on January 27.  Medical decision making of high complexity was utilized today.     Tully Theophilus Andrews, MD [facility]     [1] No Known Allergies [2]  Current Outpatient Medications:    allopurinol  (ZYLOPRIM ) 100 MG tablet, Take 1 tablet (100 mg total) by mouth daily., Disp: 30 tablet, Rfl: 6   amLODipine  (NORVASC ) 10 MG tablet, Take 10 mg by mouth daily., Disp: , Rfl:    [START ON 06/20/2024] apixaban  (ELIQUIS ) 5 MG TABS tablet, Take 1 tablet (5 mg total) by mouth 2 (two) times daily. Start this after your starter pack is complete on 06/20/2024., Disp: 120 tablet, Rfl: 0   bimatoprost (LUMIGAN) 0.01 % SOLN, Place 1 drop into both eyes at bedtime., Disp: , Rfl:    brimonidine  (ALPHAGAN  P) 0.1 % SOLN, Place 1 drop into both eyes daily., Disp: , Rfl:    calcitRIOL  (ROCALTROL ) 0.25 MCG capsule, Take 0.25 mcg by mouth daily., Disp: , Rfl:    cefadroxil  (DURICEF) 500 MG capsule, Take 1 capsule (500 mg total) by mouth 2 (two) times daily for 28 days., Disp: 56 capsule, Rfl: 0   doxycycline  (VIBRA -TABS) 100 MG tablet, Take 1 tablet (100 mg total) by mouth every 12 (twelve) hours for 28 days., Disp: 56 tablet, Rfl: 0   levETIRAcetam  (KEPPRA ) 500 MG tablet, Take 1 tablet (500 mg total) by mouth 2  (two) times daily., Disp: 180 tablet, Rfl: 4   levothyroxine  (SYNTHROID , LEVOTHROID) 88 MCG tablet, Take 88 mcg by mouth daily before breakfast., Disp: , Rfl:    predniSONE  (DELTASONE ) 10 MG tablet, Take 4 tablets (40 mg total) by mouth daily with breakfast for 1 day, THEN 3 tablets (30 mg total) daily with breakfast for 3 days, THEN 2 tablets (20 mg total) daily with breakfast for 3 days, THEN 1 tablet (10 mg total) daily with breakfast for 3 days. Talk to your PCP about starting allopurinol  as an outpatient.  Resume your hydrocortisone  as previously prescribed when you complete this prescription., Disp: 22 tablet, Rfl: 0   simvastatin  (ZOCOR ) 20 MG tablet, Take 20 mg by mouth daily at 6 PM. , Disp: ,  Rfl:    tamsulosin  (FLOMAX ) 0.4 MG CAPS capsule, Take 0.4 mg by mouth daily. , Disp: , Rfl:    TESTOSTERONE  COMPOUNDING KIT TD, Place 1 mL onto the skin every morning., Disp: , Rfl:    timolol  (TIMOPTIC ) 0.5 % ophthalmic solution, Place 1 drop into both eyes daily. , Disp: , Rfl:    VITAMIN D  PO, Take 1 tablet by mouth daily., Disp: , Rfl:    acetaminophen  (TYLENOL ) 325 MG tablet, Take 3 tablets (975 mg total) by mouth every 8 (eight) hours for 10 days, THEN 2 tablets (650 mg total) every 6 (six) hours as needed for up to 10 days for mild pain (pain score 1-3), fever or headache. (Patient not taking: No sig reported), Disp: 100 tablet, Rfl: 0   Finerenone (KERENDIA) 10 MG TABS, Take by mouth. (Patient not taking: Reported on 05/24/2024), Disp: , Rfl:    folic acid  (FOLVITE ) 1 MG tablet, Take 1 tablet (1 mg total) by mouth daily. Follow your low folate levels with your outpatient provider., Disp: 90 tablet, Rfl: 0   hydrocortisone  (CORTEF ) 10 MG tablet, Take 10-15 mg by mouth See admin instructions. Take 15mg  by mouth every morning and take 10mg  by mouth tablet in the evening. (Patient not taking: Reported on 05/24/2024), Disp: , Rfl:    [Paused] olmesartan-hydrochlorothiazide (BENICAR HCT) 20-12.5 MG tablet,  Take 1 tablet by mouth daily. (Patient not taking: Reported on 05/24/2024), Disp: , Rfl:    oxyCODONE  (OXY IR/ROXICODONE ) 5 MG immediate release tablet, Take 1 tablet (5 mg total) by mouth every 6 (six) hours as needed for moderate pain (pain score 4-6). (Patient not taking: Reported on 05/24/2024), Disp: 20 tablet, Rfl: 0  "

## 2024-05-25 ENCOUNTER — Telehealth: Payer: Self-pay | Admitting: Internal Medicine

## 2024-05-25 NOTE — Progress Notes (Signed)
 Care Guide Pharmacy Note  05/25/2024 Name: SEFERINO OSCAR MRN: [id number] DOB: 1950-11-26  Referred By: Theophilus Andrews, Tully GRADE, MD Reason for referral: Call Attempt #1 and Complex Care Management (Outreach to sch ref w/ pharm)   RAINEY KAHRS is a 74 y.o. year old male who is a primary care patient of Theophilus Andrews, Tully GRADE, MD.  Marsha KATHEE Bolognese was referred to the pharmacist for assistance related to: HTN  Successful contact was made with the patient to discuss pharmacy services including being ready for the pharmacist to call at least 5 minutes before the scheduled appointment time and to have medication bottles and any blood pressure readings ready for review. The patient agreed to meet with the pharmacist via telephone visit on 06/13/2024.  Doyce Razor [facility], [facility] Guide Direct Dial: [phone]  Fax: [phone]

## 2024-05-28 NOTE — Anesthesia Postprocedure Evaluation (Signed)
"   Anesthesia Post Note  Patient: Trevor Kelley  Procedure(s) Performed: ARTHROSCOPY, LEFT KNEE WITH DEBRIDEMENT (Left: Knee)     Patient location during evaluation: PACU Anesthesia Type: General Level of consciousness: awake and alert Pain management: pain level controlled Vital Signs Assessment: post-procedure vital signs reviewed and stable Respiratory status: spontaneous breathing, nonlabored ventilation, respiratory function stable and patient connected to nasal cannula oxygen Cardiovascular status: blood pressure returned to baseline and stable Postop Assessment: no apparent nausea or vomiting Anesthetic complications: no   No notable events documented.  Last Vitals:  Vitals:   05/20/24 0725 05/20/24 1142  BP: 113/76 116/81  Pulse: (!) 52 (!) 58  Resp: 18 18  Temp: 36.7 C 36.7 C  SpO2: 98% 94%    Last Pain:  Vitals:   05/20/24 0753  TempSrc:   PainSc: 0-No pain                 Tran Arzuaga S      "

## 2024-05-31 ENCOUNTER — Other Ambulatory Visit: Payer: Self-pay

## 2024-05-31 NOTE — Patient Instructions (Signed)
 Visit Information  Thank you for taking time to visit with me today. Please don't hesitate to contact me if I can be of assistance to you before our next scheduled telephone appointment.  Following are the goals we discussed today:   Goals Addressed             This Visit's Progress    VBCI Transitions of Care (TOC) Care Plan       Problems:  Recent Hospitalization for treatment of septic arthritis left knee.   05/31/2024  Patient reports that knee is much better. Denies any pain. Slight swelling. Reports being able to walk better.  Medication access barrier ? Cost eliquis  - 05/31/2024  Has 30 day supply,  was given another coupon card for an additional 30 day and has an appointment with pharmacist on 06/13/2024 No Hospital Follow Up Provider appointment PCP and ortho- 05/31/2024   follow up appointments scheduled.   Goal:  Over the next 30 days, the patient will not experience hospital readmission  Interventions:  Transitions of Care: Doctor Visits  - discussed the importance of doctor visits Reviewed Signs and symptoms of infection Reviewed TOC program and wife has consented. Next call scheduled Provided my contact information to wife Reviewed eliquis  cost concern- current has eliquis  and is scheduled with a telephone call with pharmacist on 06/13/2024 No other concerns about medications at this time. Will finish prednisone  today.  Reviewed progress and much improved from last week. - no pain  Patient Self Care Activities:  Attend all scheduled provider appointments Call pharmacy for medication refills 3-7 days in advance of running out of medications Call provider office for new concerns or questions  Notify RN Care Manager of TOC call rescheduling needs Participate in Transition of Care Program/Attend TOC scheduled calls Take medications as prescribed    Plan:  Telephone follow up appointment with care management team member scheduled for:  06/07/2024 The patient has been  provided with contact information for the care management team and has been advised to call with any health related questions or concerns.          Our next appointment is by telephone on 06/07/24 at 0900   Please call the care guide team at [phone] if you need to cancel or reschedule your appointment.   If you are experiencing a Mental Health or Behavioral Health Crisis or need someone to talk to, please call the Suicide and Crisis Lifeline: 988 call the [place]  National Suicide Prevention Lifeline: 7818441963 or TTY: 906-046-1572 TTY 437-846-9211) to talk to a trained counselor call 1-800-273-TALK (toll free, 24 hour hotline) call 911   Care plan and visit instructions communicated with the patient verbally today. Patient agrees to receive a copy in MyChart. Active MyChart status and patient understanding of how to access instructions and care plan via MyChart confirmed with patient.    Alan Ee, RN, BSN, CEN [employer]- Transition of Care Team.  [facility] [phone]

## 2024-05-31 NOTE — Transitions of Care (Post Inpatient/ED Visit) (Signed)
 " Transition of Care week 2  Visit Note  05/31/2024  Name: Trevor Kelley MRN: [id number]          DOB: [birth year]  Situation: Patient enrolled in [facility] 30-day program. Visit completed with patient and wife on speaker phone by telephone.   Background:   Initial Transition Care Management Follow-up Telephone Call Discharge Date and Diagnosis: 05/20/24, Septic arthritis   Past Medical History:  Diagnosis Date   CKD (chronic kidney disease)    Diabetes mellitus without complication (HCC)    DM (diabetes mellitus), type 2 (HCC)    History of cerebral meningioma    History of pituitary adenoma    Hypertension    Hypothyroidism    OSA (obstructive sleep apnea)    Panhypopituitarism    Seizure disorder (HCC) 05/31/2017    Assessment: patient in good spirits and reports that he is doing much better. Denies any pain. Continues to take his medications as prescribed.  Patient Reported Symptoms: Cognitive Cognitive Status: Able to follow simple commands, Alert and oriented to person, place, and time, Normal speech and language skills      Neurological Neurological Review of Symptoms: No symptoms reported    HEENT HEENT Symptoms Reported: No symptoms reported      Cardiovascular Cardiovascular Symptoms Reported: No symptoms reported    Respiratory Respiratory Symptoms Reported: No symptoms reported    Endocrine Endocrine Symptoms Reported: No symptoms reported Is patient diabetic?: No Endocrine Self-Management Outcome: 5 (very good)  Gastrointestinal Gastrointestinal Symptoms Reported: No symptoms reported      Genitourinary Genitourinary Symptoms Reported: No symptoms reported    Integumentary Integumentary Symptoms Reported: Other Additional Integumentary Details: denies any concerns today Skin Management Strategies: Routine screening Skin Self-Management Outcome: 5 (very good)  Musculoskeletal Additional Musculoskeletal Details: patient reports that he is walking  well. Musculoskeletal Comment: has seen PCP and Infectious disease and is continuing his antibiotics.      Psychosocial Psychosocial Symptoms Reported: Not assessed         There were no vitals filed for this visit. Pain Scale: 0-10 Pain Score: 0-No pain  Medications Reviewed Today     Reviewed by Rumalda Alan PENNER, RN (Registered Nurse) on 05/31/24 at 0913  Med List Status: <None>   Medication Order Taking? Sig Documenting Provider Last Dose Status Informant  acetaminophen  (TYLENOL ) 325 MG tablet [id number] Yes Take 3 tablets (975 mg total) by mouth every 8 (eight) hours for 10 days, THEN 2 tablets (650 mg total) every 6 (six) hours as needed for up to 10 days for mild pain (pain score 1-3), fever or headache. Perri DELENA Meliton Mickey., MD  Active   allopurinol  (ZYLOPRIM ) 100 MG tablet [id number] Yes Take 1 tablet (100 mg total) by mouth daily. Theophilus Andrews, Tully GRADE, MD  Active   amLODipine  (NORVASC ) 10 MG tablet [id number] Yes Take 10 mg by mouth daily. [provider]  Active Spouse/Significant Other, Pharmacy Records  apixaban  (ELIQUIS ) 5 MG TABS tablet [id number] Yes Take 1 tablet (5 mg total) by mouth 2 (two) times daily. Start this after your starter pack is complete on 06/20/2024. Theophilus Andrews, Tully GRADE, MD  Active   bimatoprost (LUMIGAN) 0.01 % SOLN [id number] Yes Place 1 drop into both eyes at bedtime. [provider]  Active Spouse/Significant Other  brimonidine  (ALPHAGAN  P) 0.1 % SOLN [id number] Yes Place 1 drop into both eyes daily. [provider]  Active Spouse/Significant Other  calcitRIOL  (ROCALTROL ) 0.25 MCG capsule [id number] Yes Take 0.25 mcg by mouth  daily. [provider]  Active Spouse/Significant Other, Pharmacy Records  cefadroxil  (DURICEF) 500 MG capsule [id number] Yes Take 1 capsule (500 mg total) by mouth 2 (two) times daily for 28 days. Perri DELENA Meliton Mickey., MD  Active   doxycycline  (VIBRA -TABS) 100 MG tablet [id number] Yes Take 1  tablet (100 mg total) by mouth every 12 (twelve) hours for 28 days. Perri DELENA Meliton Mickey., MD  Active   Finerenone [facility]) 10 MG TABS [id number] Yes Take by mouth. [provider]  Active Spouse/Significant Other, Pharmacy Records  folic acid  (FOLVITE ) 1 MG tablet [id number] Yes Take 1 tablet (1 mg total) by mouth daily. Follow your low folate levels with your outpatient provider. Theophilus Andrews, Tully GRADE, MD  Active   hydrocortisone  (CORTEF ) 10 MG tablet [id number]  Take 10-15 mg by mouth See admin instructions. Take 15mg  by mouth every morning and take 10mg  by mouth tablet in the evening.  Patient not taking: Reported on 05/31/2024   [provider]  Active Spouse/Significant Other, Pharmacy Records  levETIRAcetam  (KEPPRA ) 500 MG tablet [id number] Yes Take 1 tablet (500 mg total) by mouth 2 (two) times daily. Gayland Lauraine PARAS, NP  Active Spouse/Significant Other, Pharmacy Records  levothyroxine  (SYNTHROID , LEVOTHROID) 88 MCG tablet [id number] Yes Take 88 mcg by mouth daily before breakfast. [provider]  Active Spouse/Significant Other, Pharmacy Records  olmesartan-hydrochlorothiazide (BENICAR HCT) 20-12.5 MG tablet [id number]  Take 1 tablet by mouth daily.  Patient not taking: Reported on 05/31/2024   [provider]  Active Spouse/Significant Other, Pharmacy Records  oxyCODONE  (OXY IR/ROXICODONE ) 5 MG immediate release tablet [id number]  Take 1 tablet (5 mg total) by mouth every 6 (six) hours as needed for moderate pain (pain score 4-6).  Patient not taking: Reported on 05/31/2024   Perri DELENA Meliton Mickey., MD  Active   predniSONE  (DELTASONE ) 10 MG tablet [id number] Yes Take 4 tablets (40 mg total) by mouth daily with breakfast for 1 day, THEN 3 tablets (30 mg total) daily with breakfast for 3 days, THEN 2 tablets (20 mg total) daily with breakfast for 3 days, THEN 1 tablet (10 mg total) daily with breakfast for 3 days. Talk to your PCP about starting allopurinol  as  an outpatient.  Resume your hydrocortisone  as previously prescribed when you complete this prescription. Perri DELENA Meliton Mickey., MD  Active   simvastatin  (ZOCOR ) 20 MG tablet [id number] Yes Take 20 mg by mouth daily at 6 PM.  [provider]  Active Spouse/Significant Other, Pharmacy Records  tamsulosin  (FLOMAX ) 0.4 MG CAPS capsule [id number] Yes Take 0.4 mg by mouth daily.  [provider]  Active Spouse/Significant Other, Pharmacy Records  TESTOSTERONE  COMPOUNDING KIT TD [id number] Yes Place 1 mL onto the skin every morning. [provider]  Active Spouse/Significant Other, Pharmacy Records  timolol  (TIMOPTIC ) 0.5 % ophthalmic solution [id number] Yes Place 1 drop into both eyes daily.  [provider]  Active Spouse/Significant Other, Pharmacy Records           Med Note LEOBARDO SCHUYLER RAMAN   Sat Jun 05, 2022  1:30 PM)    VITAMIN D  PO [id number] Yes Take 1 tablet by mouth daily. [provider]  Active Spouse/Significant Other, Pharmacy Records            Goals Addressed             This Visit's Progress    [facility] Transitions of Care (TOC) Care Plan       Problems:  Recent Hospitalization  for treatment of septic arthritis left knee.   05/31/2024  Patient reports that knee is much better. Denies any pain. Slight swelling. Reports being able to walk better.  Medication access barrier ? Cost eliquis  - 05/31/2024  Has 30 day supply,  was given another coupon card for an additional 30 day and has an appointment with pharmacist on 06/13/2024 No Hospital Follow Up Provider appointment PCP and ortho- 05/31/2024   follow up appointments scheduled.   Goal:  Over the next 30 days, the patient will not experience hospital readmission  Interventions:  Transitions of Care: Doctor Visits  - discussed the importance of doctor visits Reviewed Signs and symptoms of infection Reviewed TOC program and wife has consented. Next call scheduled Provided my contact information to  wife Reviewed eliquis  cost concern- current has eliquis  and is scheduled with a telephone call with pharmacist on 06/13/2024 No other concerns about medications at this time. Will finish prednisone  today.  Reviewed progress and much improved from last week. - no pain  Patient Self Care Activities:  Attend all scheduled provider appointments Call pharmacy for medication refills 3-7 days in advance of running out of medications Call provider office for new concerns or questions  Notify RN Care Manager of TOC call rescheduling needs Participate in Transition of Care Program/Attend TOC scheduled calls Take medications as prescribed    Plan:  Telephone follow up appointment with care management team member scheduled for:  06/07/2024 The patient has been provided with contact information for the care management team and has been advised to call with any health related questions or concerns.         Recommendation:   Continue Current Plan of Care  Follow Up Plan:   Telephone follow up appointment date/time:  06/07/2024 at 0900   Alan Ee, RN, BSN, CEN Population Health- Transition of Care Team.  [facility] [phone]    "

## 2024-06-05 ENCOUNTER — Inpatient Hospital Stay: Payer: Self-pay | Admitting: Internal Medicine

## 2024-06-07 ENCOUNTER — Telehealth: Payer: Self-pay

## 2024-06-07 ENCOUNTER — Other Ambulatory Visit: Payer: Self-pay

## 2024-06-07 NOTE — Patient Instructions (Signed)
 Visit Information  Thank you for taking time to visit with me today. Please don't hesitate to contact me if I can be of assistance to you before our next scheduled telephone appointment.  Our next appointment is by telephone on 06/14/2024 at 0900.  Following is a copy of your care plan:   Goals Addressed             This Visit's Progress    [facility] Transitions of Care (TOC) Care Plan       Problems:  Recent Hospitalization for treatment of septic arthritis left knee.   05/31/2024  Patient reports that knee is much better. Denies any pain. Slight swelling. Reports being able to walk better.   06/07/24 Patient reports continued improvement, with no pain at this time.  Medication access barrier ? Cost eliquis  - 05/31/2024  Has 30 day supply,  was given another coupon card for an additional 30 day and has an appointment with pharmacist on 06/13/2024 No Hospital Follow Up Provider appointment PCP and ortho- 05/31/2024   follow up appointments scheduled.  1/29 Completed PCP f/u on 1/15 and scheduled for ID hospital follow up on 06/14/24.  Goal:  Over the next 30 days, the patient will not experience hospital readmission  Interventions:  Transitions of Care: Doctor Visits  - confirmed pt aware of upcoming ID and pharmacy appts  Educated on signs of allergic reaction.  Pt understands to call PCP if lip swelling re-occurs. Reviewed eliquis  cost concern- currently has eliquis  and is scheduled with a telephone call with pharmacist on 06/13/2024 No concerns about medications at this time. Completed prednisone  and started hydrocortisone , per PCP.   Pt still holding BP med, Olmesartan/hydrochlorothiazide. Reviewed progress and much improved from last week. - no pain  Patient Self Care Activities:  Attend all scheduled provider appointments Call pharmacy for medication refills 3-7 days in advance of running out of medications Call provider office for new concerns or questions  Notify RN Care Manager of TOC call  rescheduling needs Participate in Transition of Care Program/Attend TOC scheduled calls Take medications as prescribed    Plan:  Telephone follow up appointment with care management team member scheduled for:  06/14/2024 The patient has been provided with contact information for the care management team and has been advised to call with any health related questions or concerns.         Patient verbalizes understanding of instructions and care plan provided today and agrees to view in MyChart. Active MyChart status and patient understanding of how to access instructions and care plan via MyChart confirmed with patient.     The patient has been provided with contact information for the care management team and has been advised to call with any health related questions or concerns.   Please call the care guide team at [phone] if you need to cancel or reschedule your appointment.   Please call the Suicide and Crisis Lifeline: 988 if you are experiencing a Mental Health or Behavioral Health Crisis or need someone to talk to.  Almarie Shams, RN [place] / [facility] RN Care Manager / Transition of Care Direct Dial: [phone]  with Dionne Leath, RN  [place] / [facility] RN Care Manager / Transition of Care

## 2024-06-07 NOTE — Transitions of Care (Post Inpatient/ED Visit) (Signed)
 " Transition of Care week 3  Visit Note  06/07/2024  Name: Trevor Kelley MRN: [id number]          DOB: [birth year]  Situation: Patient enrolled in [facility] 30-day program. Visit completed with patient and wife by telephone.   Background:   Initial Transition Care Management Follow-up Telephone Call Discharge Date and Diagnosis: 05/20/24, Septic arthritis   Past Medical History:  Diagnosis Date   CKD (chronic kidney disease)    Diabetes mellitus without complication (HCC)    DM (diabetes mellitus), type 2 (HCC)    History of cerebral meningioma    History of pituitary adenoma    Hypertension    Hypothyroidism    OSA (obstructive sleep apnea)    Panhypopituitarism    Seizure disorder (HCC) 05/31/2017    Assessment: Patient Reported Symptoms: Cognitive Cognitive Status: Alert and oriented to person, place, and time, Normal speech and language skills      Neurological Neurological Review of Symptoms: No symptoms reported    HEENT HEENT Symptoms Reported: Other: (Patient reported upper lip swelling on Tuesday night, lasting about 24 hours.  I advised him to call PCP office if this re-occurs as it could be sign of allergic reaction.) HEENT Management Strategies: Routine screening    Cardiovascular Cardiovascular Symptoms Reported: No symptoms reported Does patient have uncontrolled Hypertension?: No    Respiratory Respiratory Symptoms Reported: No symptoms reported    Endocrine Endocrine Symptoms Reported: No symptoms reported    Gastrointestinal Gastrointestinal Symptoms Reported: No symptoms reported      Genitourinary Genitourinary Symptoms Reported: No symptoms reported    Integumentary Integumentary Symptoms Reported: Itching, Incision Additional Integumentary Details: Patient reports new itching on arms and legs, intermittent in nature; denies rash and hives.   States incision to left knee looks good, without redness or drainage; wound is left open to air. Skin  Management Strategies: Routine screening  Musculoskeletal Musculoskelatal Symptoms Reviewed: Limited mobility Additional Musculoskeletal Details: Patient states pain is resolves but still not back to baseline activity level.   Falls in the past year?: No    Psychosocial Psychosocial Symptoms Reported: No symptoms reported         There were no vitals filed for this visit. Pain Scale: 0-10 Pain Score: 0-No pain  Medications Reviewed Today     Reviewed by Elaine Almarie LABOR, RN (Registered Nurse) on 06/07/24 at 0913  Med List Status: <None>   Medication Order Taking? Sig Documenting Provider Last Dose Status Informant  acetaminophen  (TYLENOL ) 325 MG tablet [id number] Yes Take 3 tablets (975 mg total) by mouth every 8 (eight) hours for 10 days, THEN 2 tablets (650 mg total) every 6 (six) hours as needed for up to 10 days for mild pain (pain score 1-3), fever or headache. Perri LABOR Meliton Mickey., MD  Active   allopurinol  (ZYLOPRIM ) 100 MG tablet [id number] Yes Take 1 tablet (100 mg total) by mouth daily. Theophilus Andrews, Tully GRADE, MD  Active   amLODipine  (NORVASC ) 10 MG tablet [id number] Yes Take 10 mg by mouth daily. [provider]  Active Spouse/Significant Other, Pharmacy Records  apixaban  (ELIQUIS ) 5 MG TABS tablet [id number] Yes Take 1 tablet (5 mg total) by mouth 2 (two) times daily. Start this after your starter pack is complete on 06/20/2024. Theophilus Andrews, Tully GRADE, MD  Active   bimatoprost (LUMIGAN) 0.01 % SOLN [id number] Yes Place 1 drop into both eyes at bedtime. [provider]  Active Spouse/Significant Other  brimonidine  (ALPHAGAN  P) 0.1 % SOLN  [id number] Yes Place 1 drop into both eyes daily. [provider]  Active Spouse/Significant Other  calcitRIOL  (ROCALTROL ) 0.25 MCG capsule [id number] Yes Take 0.25 mcg by mouth daily. [provider]  Active Spouse/Significant Other, Pharmacy Records  cefadroxil  (DURICEF) 500 MG capsule [id number] Yes Take  1 capsule (500 mg total) by mouth 2 (two) times daily for 28 days. Perri DELENA Meliton Mickey., MD  Active   doxycycline  (VIBRA -TABS) 100 MG tablet [id number] Yes Take 1 tablet (100 mg total) by mouth every 12 (twelve) hours for 28 days. Perri DELENA Meliton Mickey., MD  Active   Finerenone [facility]) 10 MG TABS [id number] Yes Take 10 mg by mouth daily. [provider]  Active Spouse/Significant Other, Pharmacy Records  folic acid  (FOLVITE ) 1 MG tablet [id number] Yes Take 1 tablet (1 mg total) by mouth daily. Follow your low folate levels with your outpatient provider. Theophilus Andrews, Tully GRADE, MD  Active   hydrocortisone  (CORTEF ) 10 MG tablet [id number] Yes Take 10-15 mg by mouth See admin instructions. Take 15mg  by mouth every morning and take 10mg  by mouth tablet in the evening. [provider]  Active Spouse/Significant Other, Pharmacy Records  levETIRAcetam  (KEPPRA ) 500 MG tablet [id number] Yes Take 1 tablet (500 mg total) by mouth 2 (two) times daily. Gayland Lauraine PARAS, NP  Active Spouse/Significant Other, Pharmacy Records  levothyroxine  (SYNTHROID , LEVOTHROID) 88 MCG tablet [id number] Yes Take 88 mcg by mouth daily before breakfast. [provider]  Active Spouse/Significant Other, Pharmacy Records  olmesartan-hydrochlorothiazide (BENICAR HCT) 20-12.5 MG tablet [id number]  Take 1 tablet by mouth daily.  Patient not taking: Reported on 06/07/2024   [provider]  Active Spouse/Significant Other, Pharmacy Records  oxyCODONE  (OXY IR/ROXICODONE ) 5 MG immediate release tablet [id number]  Take 1 tablet (5 mg total) by mouth every 6 (six) hours as needed for moderate pain (pain score 4-6).  Patient not taking: Reported on 06/07/2024   Perri DELENA Meliton Mickey., MD  Active   simvastatin  (ZOCOR ) 20 MG tablet [id number] Yes Take 20 mg by mouth daily at 6 PM.  [provider]  Active Spouse/Significant Other, Pharmacy Records  tamsulosin  (FLOMAX ) 0.4 MG CAPS capsule [id number] Yes Take 0.4  mg by mouth daily.  [provider]  Active Spouse/Significant Other, Pharmacy Records  TESTOSTERONE  COMPOUNDING KIT TD [id number] Yes Place 1 mL onto the skin every morning. [provider]  Active Spouse/Significant Other, Pharmacy Records  timolol  (TIMOPTIC ) 0.5 % ophthalmic solution [id number] Yes Place 1 drop into both eyes daily.  [provider]  Active Spouse/Significant Other, Pharmacy Records           Med Note LEOBARDO SCHUYLER RAMAN   Sat Jun 05, 2022  1:30 PM)    VITAMIN D  PO [id number]  Take 1 tablet by mouth daily. 1000 IUs [provider]  Active Spouse/Significant Other, Pharmacy Records            Goals Addressed             This Visit's Progress    VBCI Transitions of Care (TOC) Care Plan       Problems:  Recent Hospitalization for treatment of septic arthritis left knee.   05/31/2024  Patient reports that knee is much better. Denies any pain. Slight swelling. Reports being able to walk better.   06/07/24 Patient reports continued improvement, with no pain at this time.  Medication access barrier ? Cost eliquis  - 05/31/2024  Has 30 day supply,  was given another coupon card for  an additional 30 day and has an appointment with pharmacist on 06/13/2024 No Hospital Follow Up Provider appointment PCP and ortho- 05/31/2024   follow up appointments scheduled.  1/29 Completed PCP f/u on 1/15 and scheduled for ID hospital follow up on 06/14/24.  Goal:  Over the next 30 days, the patient will not experience hospital readmission  Interventions:  Transitions of Care: Doctor Visits  - confirmed pt aware of upcoming ID and pharmacy appts  Educated on signs of allergic reaction.  Pt understands to call PCP if lip swelling re-occurs. Reviewed eliquis  cost concern- currently has eliquis  and is scheduled with a telephone call with pharmacist on 06/13/2024 No concerns about medications at this time. Completed prednisone  and started hydrocortisone , per PCP.   Pt still  holding BP med, Olmesartan/hydrochlorothiazide. Reviewed progress and much improved from last week. - no pain  Patient Self Care Activities:  Attend all scheduled provider appointments Call pharmacy for medication refills 3-7 days in advance of running out of medications Call provider office for new concerns or questions  Notify RN Care Manager of TOC call rescheduling needs Participate in Transition of Care Program/Attend TOC scheduled calls Take medications as prescribed    Plan:  Telephone follow up appointment with care management team member scheduled for:  06/14/2024 The patient has been provided with contact information for the care management team and has been advised to call with any health related questions or concerns.         Recommendation:   Continue Current Plan of Care  Follow Up Plan:   Telephone follow-up in 1 week with Alan Ee, RN  Almarie Shams, RN [place] / [facility] RN Care Manager / Transition of Care Direct Dial: [phone]    "

## 2024-06-13 ENCOUNTER — Other Ambulatory Visit

## 2024-06-13 DIAGNOSIS — I824Z2 Acute embolism and thrombosis of unspecified deep veins of left distal lower extremity: Secondary | ICD-10-CM

## 2024-06-13 NOTE — Progress Notes (Signed)
" ° °  06/13/2024 Name: Trevor Kelley MRN: [id number] DOB: [birth year]  Chief Complaint  Patient presents with   Medication Assistance    Trevor Kelley     Trevor Kelley is a 74 y.o. year old male who presented for a telephone visit.   They were referred to the pharmacist by their PCP for assistance in managing medication access and complex medication management.    Subjective:  Care Team: Primary Care Provider: Theophilus Andrews, Tully GRADE, MD   Medication Access/Adherence  Current Pharmacy:  [facility] [address] [place]), [place] - [address] [place] ([place]) [place] [address] Phone: [phone] Fax: [phone]  [facility] - [place], [place] - [address] [place] [address] Phone: [phone] Fax: [phone]  [name] [address] [place] [address] Phone: [phone] Fax: [phone]   Patient reports affordability concerns with their medications: Yes  - Trevor Kelley  Patient reports access/transportation concerns to their pharmacy: No  Patient reports adherence concerns with their medications:  No     Hypertension:  Current medications: Amlodipine , Kerendia, Olmesartan-hydrochlorothiazide (being held) Medications previously tried: None  Patient has a validated, automated, upper arm home BP cuff Current blood pressure readings readings: unable to provide specific readings but says still looking okay at home  Patient denies hypotensive s/sx including dizziness, lightheadedness.  Patient denies hypertensive symptoms including headache, chest pain, shortness of breath    Medication Access: -Cost concerns with Trevor Kelley  -Denies cost concerns with any other medications -Has enough Trevor Kelley  to get through until mid-March    Objective:  Lab Results  Component Value Date   HGBA1C 6.4 (H) 05/18/2024    Lab Results  Component Value Date    CREATININE 2.59 (H) 05/19/2024   BUN 56 (H) 05/19/2024   NA 136 05/19/2024   K 4.4 05/19/2024   CL 105 05/19/2024   CO2 18 (L) 05/19/2024    Lab Results  Component Value Date   CHOL 122 09/03/2013   HDL 30 (L) 09/03/2013   LDLCALC 71 09/03/2013   TRIG 103 09/03/2013    Medications Reviewed Today   Medications were not reviewed in this encounter       Assessment/Plan:   Hypertension: - Currently uncontrolled - Reviewed long term cardiovascular and renal outcomes of uncontrolled blood pressure - Reviewed appropriate blood pressure monitoring technique and reviewed goal blood pressure. Recommended to check home blood pressure and heart rate 2-3x/week - Recommend to continue current med therapy. If BP remains elevated or worsens, need to consider resuming olmesartan, would recommend BMP check first       Medication Management: - Discussed with PCP regarding duration of Trevor Kelley  therapy. Proceed with plan to anticoagulate x 3 months, needs enough to get through mid-April. -Does not qualify for med assistance at this time, will provide more samples to get patient through recommended duration of treatment    Follow Up Plan:  4 weeks  Jon VEAR Lindau, PharmD Clinical Pharmacist [phone]  "

## 2024-06-14 ENCOUNTER — Telehealth: Payer: Self-pay

## 2024-06-14 ENCOUNTER — Encounter: Payer: Self-pay | Admitting: Internal Medicine

## 2024-06-14 ENCOUNTER — Other Ambulatory Visit: Payer: Self-pay

## 2024-06-14 ENCOUNTER — Ambulatory Visit: Admitting: Internal Medicine

## 2024-06-14 VITALS — BP 146/99 | HR 109 | Temp 98.0°F | Resp 16 | Wt 238.0 lb

## 2024-06-14 DIAGNOSIS — Z792 Long term (current) use of antibiotics: Secondary | ICD-10-CM

## 2024-06-14 DIAGNOSIS — M1310 Monoarthritis, not elsewhere classified, unspecified site: Secondary | ICD-10-CM

## 2024-06-14 DIAGNOSIS — B9689 Other specified bacterial agents as the cause of diseases classified elsewhere: Secondary | ICD-10-CM

## 2024-06-14 DIAGNOSIS — M009 Pyogenic arthritis, unspecified: Secondary | ICD-10-CM

## 2024-06-14 DIAGNOSIS — M109 Gout, unspecified: Secondary | ICD-10-CM

## 2024-06-14 DIAGNOSIS — M00862 Arthritis due to other bacteria, left knee: Secondary | ICD-10-CM

## 2024-06-14 NOTE — Progress Notes (Signed)
 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 08/20/2021   OSA treated with BiPAP 08/20/2021   Seizure disorder (HCC) 05/31/2017   Thrombocytopenia 01/01/2017   Sepsis (HCC) 12/31/2016   Cellulitis 12/31/2016   Diarrhea 12/31/2016   Anemia 12/31/2016   OSA (obstructive sleep apnea) 12/03/2014   CKD (chronic kidney disease) 05/07/2014   Pituitary adenoma (HCC) 02/12/2014   DM (diabetes mellitus), type 2 (HCC) 02/05/2014   Obesity (BMI 30-39.9) 02/05/2014   Secondary adrenal insufficiency 02/05/2014   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   MRN: [id number]  Chief Complaint  Patient presents with   Hospitalization Follow-up    Gout vs septic arthritis     HPI:  Trevor Kelley is a 74 y.o. male hx gout recent hospital admission acute left knee arthritis here for f/u   He had culture negative but high wbc count; ended up going lap I&D. Cx remained negative Gout crystal positive Ortho wanted to treat as septic arthritis  Discharged on doxy/cefadroxil   No complaint today 90% baseline left knee     Allergies[1]    Outpatient Medications Prior to Visit  Medication Sig Dispense Refill   allopurinol  (ZYLOPRIM ) 100 MG tablet Take 1 tablet (100 mg total) by mouth daily. 30 tablet 6   [START  ON 06/20/2024] apixaban  (ELIQUIS ) 5 MG TABS tablet Take 1 tablet (5 mg total) by mouth 2 (two) times daily. Start this after your starter pack is complete on 06/20/2024.     bimatoprost (LUMIGAN) 0.01 % SOLN Place 1 drop into both eyes at bedtime.     brimonidine  (ALPHAGAN  P) 0.1 % SOLN Place 1 drop into both eyes daily.     calcitRIOL  (ROCALTROL ) 0.25 MCG capsule Take 0.25 mcg by mouth daily.     cefadroxil  (DURICEF) 500 MG capsule Take 1 capsule (500 mg total) by mouth 2 (two) times daily for 28 days. 56 capsule 0   doxycycline  (VIBRA -TABS) 100 MG tablet Take 1 tablet (100 mg total) by mouth every 12 (twelve) hours for 28 days. 56 tablet 0   Finerenone (KERENDIA) 10 MG TABS Take 10 mg by mouth daily.     folic acid  (FOLVITE ) 1 MG tablet Take 1 tablet (1 mg total) by mouth daily. Follow your low folate levels with your outpatient provider. 90 tablet 0   hydrocortisone  (CORTEF ) 10 MG tablet Take 10-15 mg by mouth See admin instructions. Take 15mg  by mouth every morning and take 10mg  by mouth tablet in the evening.     levETIRAcetam  (KEPPRA ) 500 MG tablet Take 1 tablet (500 mg total) by mouth 2 (two) times daily. 180 tablet 4   levothyroxine  (SYNTHROID , LEVOTHROID) 88 MCG tablet Take 88 mcg by mouth daily before breakfast.     simvastatin  (ZOCOR ) 20 MG tablet  Take 20 mg by mouth daily at 6 PM.      tamsulosin  (FLOMAX ) 0.4 MG CAPS capsule Take 0.4 mg by mouth daily.      TESTOSTERONE  COMPOUNDING KIT TD Place 1 mL onto the skin every morning.     timolol  (TIMOPTIC ) 0.5 % ophthalmic solution Place 1 drop into both eyes daily.      VITAMIN D  PO Take 1 tablet by mouth daily.     amLODipine  (NORVASC ) 10 MG tablet Take 10 mg by mouth daily. (Patient not taking: Reported on 06/14/2024)     olmesartan-hydrochlorothiazide (BENICAR HCT) 20-12.5 MG tablet Take 1 tablet by mouth daily. (Patient not taking: Reported on 06/14/2024)     oxyCODONE  (OXY IR/ROXICODONE ) 5 MG immediate release tablet Take 1 tablet (5 mg  total) by mouth every 6 (six) hours as needed for moderate pain (pain score 4-6). (Patient not taking: Reported on 06/14/2024) 20 tablet 0   No facility-administered medications prior to visit.     Social History   Socioeconomic History   Marital status: Married    Spouse name: Barnie   Number of children: 2   Years of education: 12   Highest education level: Not on file  Occupational History   Occupation: Retired  Tobacco Use   Smoking status: Never   Smokeless tobacco: Never  Vaping Use   Vaping status: Never Used  Substance and Sexual Activity   Alcohol use: Never    Comment: occassional   Drug use: No   Sexual activity: Never  Other Topics Concern   Not on file  Social History Narrative   Lives with wife   Married, 2 children   Right handed   12 th grade   Caffeine use: 1 per month or less   Social Drivers of Health   Tobacco Use: Low Risk (06/14/2024)   Patient History    Smoking Tobacco Use: Never    Smokeless Tobacco Use: Never    Passive Exposure: Not on file  Financial Resource Strain: Low Risk (08/25/2023)   Overall Financial Resource Strain (CARDIA)    Difficulty of Paying Living Expenses: Not hard at all  Food Insecurity: No Food Insecurity (05/21/2024)   Epic    Worried About Programme Researcher, Broadcasting/film/video in the Last Year: Never true    Ran Out of Food in the Last Year: Never true  Transportation Needs: No Transportation Needs (05/21/2024)   Epic    Lack of Transportation (Medical): No    Lack of Transportation (Non-Medical): No  Physical Activity: Insufficiently Active (08/25/2023)   Exercise Vital Sign    Days of Exercise per Week: 3 days    Minutes of Exercise per Session: 40 min  Stress: No Stress Concern Present (08/25/2023)   [employer] of Occupational Health - Occupational Stress Questionnaire    Feeling of Stress : Not at all  Social Connections: Moderately Integrated (05/18/2024)   Social Connection and Isolation Panel    Frequency of  Communication with Friends and Family: Three times a week    Frequency of Social Gatherings with Friends and Family: Once a week    Attends Religious Services: More than 4 times per year    Active Member of [employer] or Organizations: No    Attends Banker Meetings: Never    Marital Status: Married  Catering Manager Violence: Not At Risk (05/18/2024)   Epic    Fear of Current or Ex-Partner: No    Emotionally Abused: No    Physically  Abused: No    Sexually Abused: No  Depression (PHQ2-9): Low Risk (09/06/2023)   Depression (PHQ2-9)    PHQ-2 Score: 0  Alcohol Screen: Low Risk (08/25/2023)   Alcohol Screen    Last Alcohol Screening Score (AUDIT): 0  Housing: Unknown (05/21/2024)   Epic    Unable to Pay for Housing in the Last Year: No    Number of Times Moved in the Last Year: Not on file    Homeless in the Last Year: No  Utilities: Not At Risk (05/21/2024)   Epic    Threatened with loss of utilities: No  Health Literacy: Inadequate Health Literacy (08/25/2023)   B1300 Health Literacy    Frequency of need for help with medical instructions: Sometimes      Review of Systems     Objective:    BP (!) 146/99   Pulse (!) 109   Temp 98 F (36.7 C) (Oral)   Resp 16   Wt 238 lb (108 kg)   SpO2 100%   BMI 32.28 kg/m  Nursing note and vital signs reviewed.  Physical Exam     General/constitutional: no distress, pleasant HEENT: Normocephalic, PER, Conj Clear, EOMI, Oropharynx clear Neck supple CV: rrr no mrg Lungs: clear to auscultation, normal respiratory effort Abd: Soft, Nontender Ext: no edema Skin: No Rash Neuro: nonfocal MSK: no tenderness/swelling left knee; full rom left knee  Labs:  Micro:  Serology:  Imaging:  Assessment & Plan:   Problem List Items Addressed This Visit       Musculoskeletal and Integument   Septic arthritis (HCC)   Relevant Orders   CBC   Monoarticular arthritis - Primary   Relevant Orders   CBC   COMPLETE METABOLIC  PANEL WITHOUT GFR   C-reactive protein   Sedimentation rate     Other   Acute gout   Relevant Orders   CBC   Other Visit Diagnoses       Long term (current) use of antibiotics       Relevant Orders   CBC         No orders of the defined types were placed in this encounter.    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 superimposed with gout flare Cx prior to abx given negative   Suspect all gout, although cx not one hundred percent sensitive   Discussed with ortho and agree appearance and wbc count elevation concerning and could also suggest culture negative septic arthritis  Discharged on 4 weeks doxy/cefadroxil  1/9-06/16/24   ----- 06/14/24 id clinic assessment Tolerating antibiotics Left knee almost 100% Got prednisone  in hospital and finished a couple weeks prior to this follow up   If primary team high concern could do doxy/cefadroxil  4 weeks, but a reasonable approach otherwise would be gout treatment and monitor  Labs today toxicity monitoring of abx and inflammatory check  Follow up in 4 weeks    Follow-up: Return in about 4 weeks (around 07/12/2024).  Constance ONEIDA Passer, MD [facility] 06/14/2024, 2:31 PM     [1] No Known Allergies  "

## 2024-06-14 NOTE — Patient Instructions (Signed)
 Visit Information  Thank you for taking time to visit with me today. Please don't hesitate to contact me if I can be of assistance to you before our next scheduled telephone appointment.  Our next appointment is by telephone on 06/21/24 at 9 am  Following is a copy of your care plan:   Goals Addressed             This Visit's Progress    [facility] Transitions of Care (TOC) Care Plan       Problems:  Recent Hospitalization for treatment of septic arthritis left knee.   05/31/2024  Patient reports that knee is much better. Denies any pain. Slight swelling. Reports being able to walk better.   06/07/24 Patient reports continued improvement, with no pain at this time. 06/14/24-  Patient reports he continues to do well. Denies pain.  Reports having follow up appointment with infectious disease provider today.  Continues on antibiotics.  Patient states he has his supply of Eliquis . He reports receiving a follow up call with the practice pharmacist on yesterday 06/13/24 for follow up.  Medication access barrier ? Cost eliquis  - 05/31/2024  Has 30 day supply,  was given another coupon card for an additional 30 day and has an appointment with pharmacist on 06/13/2024 No Hospital Follow Up Provider appointment PCP and ortho- 05/31/2024   follow up appointments scheduled.  1/29 Completed PCP f/u on 1/15 and scheduled for ID hospital follow up on 06/14/24.  Goal:  Over the next 30 days, the patient will not experience hospital readmission  Interventions:  Transitions of Care: Doctor Visits  - confirmed pt aware of upcoming ID and pharmacy appts  Assessed for ongoing itching symptoms to arms/ legs. Advised to notify provider for any reoccurring symptoms.  Assessed pain level Reminded patient to take antibiotics as prescribed and until completed Assessed for any new/ ongoing symptoms Confirmed patient aware of follow up appointment with infectious disease provider today.  Reviewed signs of infection. Assessed  incision site. Confirmed patient has supply of Eliquis .    Patient Self Care Activities:  Attend all scheduled provider appointments Call pharmacy for medication refills 3-7 days in advance of running out of medications Call provider office for new concerns or questions  Notify RN Care Manager of TOC call rescheduling needs Participate in Transition of Care Program/Attend TOC scheduled calls Take medications as prescribed   Notify provider for any reoccurring / new symptoms Take antibiotics as prescribed and until completed Continue to monitor incision site for infection like symptoms. Notify provider if symptoms are noted.   Plan:  Telephone follow up appointment with care management team member scheduled for:  06/21/2024 at 9 am with Alan Ee, RN  case manager The patient has been provided with contact information for the care management team and has been advised to call with any health related questions or concerns.         Patient verbalizes understanding of instructions and care plan provided today and agrees to view in MyChart. Active MyChart status and patient understanding of how to access instructions and care plan via MyChart confirmed with patient.     The patient has been provided with contact information for the care management team and has been advised to call with any health related questions or concerns.   Please call the care guide team at [phone] if you need to cancel or reschedule your appointment.   Please call the Suicide and Crisis Lifeline: 988 call the [place]  National Suicide  Prevention Lifeline: 249-151-1950 or TTY: 845-019-4879 TTY (506) 739-6861) to talk to a trained counselor call 1-800-273-TALK (toll free, 24 hour hotline) if you are experiencing a Mental Health or Behavioral Health Crisis or need someone to talk to.  Arvin Seip RN, BSN, CCM [employer], Population Health Case Manager Phone: [phone]

## 2024-06-14 NOTE — Patient Instructions (Signed)
 Please do blood tests today   Finish your antibiotics in 2 days    See me in follow up in around 4 weeks. Can set that as video visit. However if your knee is of any concern please see me in person. Schedule video visit for now

## 2024-06-14 NOTE — Transitions of Care (Post Inpatient/ED Visit) (Signed)
 " Transition of Care week 4  Visit Note  06/14/2024  Name: Trevor Kelley MRN: [id number]          DOB: Aug 01, 1950  Situation: Patient enrolled in [facility] 30-day program. Visit completed with patient and wife, Vikram Tillett by telephone.   Background:   Initial Transition Care Management Follow-up Telephone Call Discharge Date and Diagnosis: 05/20/24, Septic arthritis   Past Medical History:  Diagnosis Date   CKD (chronic kidney disease)    Diabetes mellitus without complication (HCC)    DM (diabetes mellitus), type 2 (HCC)    History of cerebral meningioma    History of pituitary adenoma    Hypertension    Hypothyroidism    OSA (obstructive sleep apnea)    Panhypopituitarism    Seizure disorder (HCC) 05/31/2017    Assessment: Patient Reported Symptoms: Cognitive Cognitive Status: No symptoms reported, Alert and oriented to person, place, and time, Insightful and able to interpret abstract concepts, Normal speech and language skills      Neurological Neurological Review of Symptoms: No symptoms reported    HEENT HEENT Symptoms Reported: No symptoms reported      Cardiovascular Cardiovascular Symptoms Reported: No symptoms reported    Respiratory Respiratory Symptoms Reported: No symptoms reported    Endocrine Endocrine Symptoms Reported: No symptoms reported    Gastrointestinal Gastrointestinal Symptoms Reported: No symptoms reported      Genitourinary Genitourinary Symptoms Reported: No symptoms reported    Integumentary Integumentary Symptoms Reported: Incision Additional Integumentary Details: patinet reports itching to arms/ legs has resolved.  Patient states left knee incision is healing well. Denies any signs of infection Skin Management Strategies: Routine screening  Musculoskeletal Musculoskelatal Symptoms Reviewed: Limited mobility Additional Musculoskeletal Details: Patient denies any pain to left knee area.  Patient confirms he has a follow up  appointment today 06/14/24 with infectious disease provider. Patient states he continues to take his antibiotics as prescribed. Musculoskeletal Management Strategies: Routine screening      Psychosocial Psychosocial Symptoms Reported: No symptoms reported         There were no vitals filed for this visit. Pain Scale: 0-10 Pain Score: 0-No pain  Medications Reviewed Today     Reviewed by Martina Brodbeck E, RN (Registered Nurse) on 06/14/24 at 0919  Med List Status: <None>   Medication Order Taking? Sig Documenting Provider Last Dose Status Informant  allopurinol  (ZYLOPRIM ) 100 MG tablet [id number] Yes Take 1 tablet (100 mg total) by mouth daily. Theophilus Andrews, Tully GRADE, MD  Active   amLODipine  (NORVASC ) 10 MG tablet [id number] Yes Take 10 mg by mouth daily. [provider]  Active Spouse/Significant Other, Pharmacy Records  apixaban  (ELIQUIS ) 5 MG TABS tablet [id number] Yes Take 1 tablet (5 mg total) by mouth 2 (two) times daily. Start this after your starter pack is complete on 06/20/2024. Theophilus Andrews, Tully GRADE, MD  Active   bimatoprost (LUMIGAN) 0.01 % SOLN [id number] Yes Place 1 drop into both eyes at bedtime. [provider]  Active Spouse/Significant Other  brimonidine  (ALPHAGAN  P) 0.1 % SOLN [id number] Yes Place 1 drop into both eyes daily. [provider]  Active Spouse/Significant Other  calcitRIOL  (ROCALTROL ) 0.25 MCG capsule [id number] Yes Take 0.25 mcg by mouth daily. [provider]  Active Spouse/Significant Other, Pharmacy Records  cefadroxil  (DURICEF) 500 MG capsule [id number] Yes Take 1 capsule (500 mg total) by mouth 2 (two) times daily for 28 days. Perri DELENA Meliton Mickey., MD  Active   doxycycline  (VIBRA -TABS) 100 MG tablet [id number]  Yes Take 1 tablet (100 mg total) by mouth every 12 (twelve) hours for 28 days. Perri DELENA Meliton Mickey., MD  Active   Finerenone [facility]) 10 MG TABS [id number] Yes Take 10 mg by mouth daily. [provider]   Active Spouse/Significant Other, Pharmacy Records  folic acid  (FOLVITE ) 1 MG tablet [id number] Yes Take 1 tablet (1 mg total) by mouth daily. Follow your low folate levels with your outpatient provider. Theophilus Andrews, Tully GRADE, MD  Active   hydrocortisone  (CORTEF ) 10 MG tablet [id number] Yes Take 10-15 mg by mouth See admin instructions. Take 15mg  by mouth every morning and take 10mg  by mouth tablet in the evening. [provider]  Active Spouse/Significant Other, Pharmacy Records  levETIRAcetam  (KEPPRA ) 500 MG tablet [id number] Yes Take 1 tablet (500 mg total) by mouth 2 (two) times daily. Gayland Lauraine PARAS, NP  Active Spouse/Significant Other, Pharmacy Records  levothyroxine  (SYNTHROID , LEVOTHROID) 88 MCG tablet [id number] Yes Take 88 mcg by mouth daily before breakfast. [provider]  Active Spouse/Significant Other, Pharmacy Records  olmesartan-hydrochlorothiazide (BENICAR HCT) 20-12.5 MG tablet [id number]  Take 1 tablet by mouth daily.  Patient not taking: Reported on 06/07/2024   [provider]  Active Spouse/Significant Other, Pharmacy Records  oxyCODONE  (OXY IR/ROXICODONE ) 5 MG immediate release tablet [id number]  Take 1 tablet (5 mg total) by mouth every 6 (six) hours as needed for moderate pain (pain score 4-6).  Patient not taking: Reported on 06/14/2024   Perri DELENA Meliton Mickey., MD  Active   simvastatin  (ZOCOR ) 20 MG tablet [id number] Yes Take 20 mg by mouth daily at 6 PM.  [provider]  Active Spouse/Significant Other, Pharmacy Records  tamsulosin  (FLOMAX ) 0.4 MG CAPS capsule [id number] Yes Take 0.4 mg by mouth daily.  [provider]  Active Spouse/Significant Other, Pharmacy Records  TESTOSTERONE  COMPOUNDING KIT TD [id number] Yes Place 1 mL onto the skin every morning. [provider]  Active Spouse/Significant Other, Pharmacy Records  timolol  (TIMOPTIC ) 0.5 % ophthalmic solution [id number] Yes Place 1 drop into both eyes daily.  [provider]  Active Spouse/Significant Other, Pharmacy Records           Med Note LEOBARDO SCHUYLER RAMAN   Sat Jun 05, 2022  1:30 PM)    VITAMIN D  PO [id number] Yes Take 1 tablet by mouth daily. [provider]  Active Spouse/Significant Other, Pharmacy Records            Goals Addressed             This Visit's Progress    [facility] Transitions of Care (TOC) Care Plan       Problems:  Recent Hospitalization for treatment of septic arthritis left knee.   05/31/2024  Patient reports that knee is much better. Denies any pain. Slight swelling. Reports being able to walk better.   06/07/24 Patient reports continued improvement, with no pain at this time. 06/14/24-  Patient reports he continues to do well. Denies pain.  Reports having follow up appointment with infectious disease provider today.  Continues on antibiotics.  Patient states he has his supply of Eliquis . He reports receiving a follow up call with the practice pharmacist on yesterday 06/13/24 for follow up.  Medication access barrier ? Cost eliquis  - 05/31/2024  Has 30 day supply,  was given another coupon card for an additional 30 day and has an appointment with pharmacist on 06/13/2024 No Hospital Follow Up Provider appointment PCP and ortho- 05/31/2024   follow up appointments  scheduled.  1/29 Completed PCP f/u on 1/15 and scheduled for ID hospital follow up on 06/14/24.  Goal:  Over the next 30 days, the patient will not experience hospital readmission  Interventions:  Transitions of Care: Doctor Visits  - confirmed pt aware of upcoming ID and pharmacy appts  Assessed for ongoing itching symptoms to arms/ legs. Advised to notify provider for any reoccurring symptoms.  Assessed pain level Reminded patient to take antibiotics as prescribed and until completed Assessed for any new/ ongoing symptoms Confirmed patient aware of follow up appointment with infectious disease provider today.  Reviewed signs of infection. Assessed incision  site. Confirmed patient has supply of Eliquis .    Patient Self Care Activities:  Attend all scheduled provider appointments Call pharmacy for medication refills 3-7 days in advance of running out of medications Call provider office for new concerns or questions  Notify RN Care Manager of TOC call rescheduling needs Participate in Transition of Care Program/Attend TOC scheduled calls Take medications as prescribed   Notify provider for any reoccurring / new symptoms Take antibiotics as prescribed and until completed Continue to monitor incision site for infection like symptoms. Notify provider if symptoms are noted.   Plan:  Telephone follow up appointment with care management team member scheduled for:  06/21/2024 at 9 am with Alan Ee, RN  case manager The patient has been provided with contact information for the care management team and has been advised to call with any health related questions or concerns.          Recommendation:   Continue Current Plan of Care  Follow Up Plan:   Telephone follow-up in 1 week  Arvin Seip RN, BSN, CCM [place]  [facility], Population Health Case Manager Phone: [phone]     "

## 2024-06-15 LAB — SEDIMENTATION RATE: Sed Rate: 39 mm/h — ABNORMAL HIGH (ref 0–20)

## 2024-06-15 LAB — COMPLETE METABOLIC PANEL WITHOUT GFR
AG Ratio: 1.1 (calc) (ref 1.0–2.5)
ALT: 29 U/L (ref 9–46)
AST: 19 U/L (ref 10–35)
Albumin: 3.5 g/dL — ABNORMAL LOW (ref 3.6–5.1)
Alkaline phosphatase (APISO): 61 U/L (ref 35–144)
BUN/Creatinine Ratio: 13 (calc) (ref 6–22)
BUN: 29 mg/dL — ABNORMAL HIGH (ref 7–25)
CO2: 24 mmol/L (ref 20–32)
Calcium: 8.9 mg/dL (ref 8.6–10.3)
Chloride: 109 mmol/L (ref 98–110)
Creat: 2.2 mg/dL — ABNORMAL HIGH (ref 0.70–1.28)
Globulin: 3.1 g/dL (ref 1.9–3.7)
Glucose, Bld: 117 mg/dL — ABNORMAL HIGH (ref 65–99)
Potassium: 4.3 mmol/L (ref 3.5–5.3)
Sodium: 140 mmol/L (ref 135–146)
Total Bilirubin: 0.4 mg/dL (ref 0.2–1.2)
Total Protein: 6.6 g/dL (ref 6.1–8.1)

## 2024-06-15 LAB — CBC
HCT: 32.6 % — ABNORMAL LOW (ref 39.4–51.1)
Hemoglobin: 9.8 g/dL — ABNORMAL LOW (ref 13.2–17.1)
MCH: 23.4 pg — ABNORMAL LOW (ref 27.0–33.0)
MCHC: 30.1 g/dL — ABNORMAL LOW (ref 31.6–35.4)
MCV: 77.8 fL — ABNORMAL LOW (ref 81.4–101.7)
MPV: 12.4 fL (ref 7.5–12.5)
Platelets: 192 10*3/uL (ref 140–400)
RBC: 4.19 Million/uL — ABNORMAL LOW (ref 4.20–5.80)
RDW: 17 % — ABNORMAL HIGH (ref 11.0–15.0)
WBC: 7.7 10*3/uL (ref 3.8–10.8)

## 2024-06-15 LAB — C-REACTIVE PROTEIN: CRP: 21.2 mg/L — ABNORMAL HIGH

## 2024-06-21 ENCOUNTER — Telehealth

## 2024-07-11 ENCOUNTER — Other Ambulatory Visit

## 2024-07-12 ENCOUNTER — Telehealth: Payer: Self-pay | Admitting: Internal Medicine

## 2024-10-03 ENCOUNTER — Ambulatory Visit: Admitting: Neurology

## 2024-10-09 ENCOUNTER — Ambulatory Visit: Admitting: Neurology
